# Patient Record
Sex: Female | Born: 1940 | Race: White | Hispanic: No | Smoking: Never smoker
Health system: Southern US, Community
[De-identification: ages and names within clinical notes are randomized; demographics above are authoritative.]

## PROBLEM LIST (undated history)

## (undated) DIAGNOSIS — I82402 Acute embolism and thrombosis of unspecified deep veins of left lower extremity: Secondary | ICD-10-CM

## (undated) DIAGNOSIS — F32A Depression, unspecified: Secondary | ICD-10-CM

## (undated) DIAGNOSIS — I340 Nonrheumatic mitral (valve) insufficiency: Secondary | ICD-10-CM

## (undated) DIAGNOSIS — I10 Essential (primary) hypertension: Secondary | ICD-10-CM

## (undated) DIAGNOSIS — I4891 Unspecified atrial fibrillation: Secondary | ICD-10-CM

## (undated) DIAGNOSIS — C801 Malignant (primary) neoplasm, unspecified: Secondary | ICD-10-CM

## (undated) DIAGNOSIS — F419 Anxiety disorder, unspecified: Secondary | ICD-10-CM

## (undated) DIAGNOSIS — G43909 Migraine, unspecified, not intractable, without status migrainosus: Secondary | ICD-10-CM

## (undated) DIAGNOSIS — I2699 Other pulmonary embolism without acute cor pulmonale: Secondary | ICD-10-CM

## (undated) DIAGNOSIS — I639 Cerebral infarction, unspecified: Secondary | ICD-10-CM

## (undated) DIAGNOSIS — E119 Type 2 diabetes mellitus without complications: Secondary | ICD-10-CM

## (undated) DIAGNOSIS — M1990 Unspecified osteoarthritis, unspecified site: Secondary | ICD-10-CM

## (undated) DIAGNOSIS — F329 Major depressive disorder, single episode, unspecified: Secondary | ICD-10-CM

## (undated) HISTORY — DX: Acute embolism and thrombosis of unspecified deep veins of left lower extremity: I82.402

## (undated) HISTORY — PX: ABDOMINAL HYSTERECTOMY: SHX81

## (undated) HISTORY — DX: Other pulmonary embolism without acute cor pulmonale: I26.99

## (undated) HISTORY — PX: TONSILLECTOMY: SUR1361

## (undated) HISTORY — DX: Nonrheumatic mitral (valve) insufficiency: I34.0

## (undated) HISTORY — PX: HERNIA REPAIR: SHX51

## (undated) HISTORY — PX: BUNIONECTOMY: SHX129

## (undated) HISTORY — PX: CATARACT EXTRACTION: SUR2

---

## 2000-12-03 ENCOUNTER — Encounter: Payer: Self-pay | Admitting: Specialist

## 2000-12-03 ENCOUNTER — Encounter: Admission: RE | Admit: 2000-12-03 | Discharge: 2000-12-03 | Payer: Self-pay | Admitting: Specialist

## 2000-12-31 ENCOUNTER — Ambulatory Visit (HOSPITAL_COMMUNITY): Admission: RE | Admit: 2000-12-31 | Discharge: 2000-12-31 | Payer: Self-pay | Admitting: Specialist

## 2000-12-31 ENCOUNTER — Encounter: Payer: Self-pay | Admitting: Specialist

## 2003-05-02 ENCOUNTER — Ambulatory Visit (HOSPITAL_COMMUNITY): Admission: RE | Admit: 2003-05-02 | Discharge: 2003-05-02 | Payer: Self-pay | Admitting: Internal Medicine

## 2010-06-27 ENCOUNTER — Encounter (HOSPITAL_COMMUNITY): Payer: Medicare Other

## 2010-06-27 ENCOUNTER — Other Ambulatory Visit: Payer: Self-pay | Admitting: Ophthalmology

## 2010-06-27 LAB — HEMOGLOBIN AND HEMATOCRIT, BLOOD
HCT: 41.1 % (ref 36.0–46.0)
Hemoglobin: 13.9 g/dL (ref 12.0–15.0)

## 2010-06-27 LAB — BASIC METABOLIC PANEL
BUN: 11 mg/dL (ref 6–23)
CO2: 27 mEq/L (ref 19–32)
Calcium: 9.1 mg/dL (ref 8.4–10.5)
Chloride: 103 mEq/L (ref 96–112)
Creatinine, Ser: 0.62 mg/dL (ref 0.4–1.2)
GFR calc Af Amer: >60 mL/min (ref >60)
GFR calc non Af Amer: >60 mL/min (ref >60)
Glucose, Bld: 170 mg/dL — ABNORMAL HIGH (ref 70–99)
Potassium: 4.3 mEq/L (ref 3.5–5.1)
Sodium: 141 mEq/L (ref 135–145)

## 2010-07-02 ENCOUNTER — Ambulatory Visit (HOSPITAL_COMMUNITY)
Admission: RE | Admit: 2010-07-02 | Discharge: 2010-07-02 | Disposition: A | Discharge status: Home / Self Care | Payer: Medicare Other | Source: Ambulatory Visit | Attending: Ophthalmology | Admitting: Ophthalmology

## 2010-07-02 DIAGNOSIS — E119 Type 2 diabetes mellitus without complications: Secondary | ICD-10-CM | POA: Insufficient documentation

## 2010-07-02 DIAGNOSIS — Z794 Long term (current) use of insulin: Secondary | ICD-10-CM | POA: Insufficient documentation

## 2010-07-02 DIAGNOSIS — Z01812 Encounter for preprocedural laboratory examination: Secondary | ICD-10-CM | POA: Insufficient documentation

## 2010-07-02 DIAGNOSIS — Z79899 Other long term (current) drug therapy: Secondary | ICD-10-CM | POA: Insufficient documentation

## 2010-07-02 DIAGNOSIS — I10 Essential (primary) hypertension: Secondary | ICD-10-CM | POA: Insufficient documentation

## 2010-07-02 DIAGNOSIS — Z7982 Long term (current) use of aspirin: Secondary | ICD-10-CM | POA: Insufficient documentation

## 2010-07-02 DIAGNOSIS — Z01818 Encounter for other preprocedural examination: Secondary | ICD-10-CM | POA: Insufficient documentation

## 2010-07-02 DIAGNOSIS — H2510 Age-related nuclear cataract, unspecified eye: Secondary | ICD-10-CM | POA: Insufficient documentation

## 2010-07-02 LAB — GLUCOSE, CAPILLARY: Glucose-Capillary: 105 mg/dL — ABNORMAL HIGH (ref 70–99)

## 2010-07-19 ENCOUNTER — Other Ambulatory Visit (HOSPITAL_COMMUNITY): Payer: Medicare Other

## 2010-07-26 ENCOUNTER — Ambulatory Visit (HOSPITAL_COMMUNITY): Admission: RE | Admit: 2010-07-26 | Payer: Medicare Other | Source: Ambulatory Visit | Admitting: Ophthalmology

## 2011-03-30 ENCOUNTER — Encounter: Payer: Self-pay | Admitting: *Deleted

## 2011-03-30 ENCOUNTER — Emergency Department (HOSPITAL_COMMUNITY): Payer: Medicare Other

## 2011-03-30 ENCOUNTER — Emergency Department (HOSPITAL_COMMUNITY)
Admission: EM | Admit: 2011-03-30 | Discharge: 2011-03-30 | Disposition: A | Discharge status: Home / Self Care | Payer: Medicare Other | Attending: Emergency Medicine | Admitting: Emergency Medicine

## 2011-03-30 DIAGNOSIS — I10 Essential (primary) hypertension: Secondary | ICD-10-CM | POA: Insufficient documentation

## 2011-03-30 DIAGNOSIS — S82899A Other fracture of unspecified lower leg, initial encounter for closed fracture: Secondary | ICD-10-CM | POA: Insufficient documentation

## 2011-03-30 DIAGNOSIS — Y92009 Unspecified place in unspecified non-institutional (private) residence as the place of occurrence of the external cause: Secondary | ICD-10-CM | POA: Insufficient documentation

## 2011-03-30 DIAGNOSIS — R296 Repeated falls: Secondary | ICD-10-CM | POA: Insufficient documentation

## 2011-03-30 DIAGNOSIS — M129 Arthropathy, unspecified: Secondary | ICD-10-CM | POA: Insufficient documentation

## 2011-03-30 DIAGNOSIS — E119 Type 2 diabetes mellitus without complications: Secondary | ICD-10-CM | POA: Insufficient documentation

## 2011-03-30 HISTORY — DX: Essential (primary) hypertension: I10

## 2011-03-30 HISTORY — DX: Unspecified osteoarthritis, unspecified site: M19.90

## 2011-03-30 MED ORDER — HYDROCODONE-ACETAMINOPHEN 5-325 MG PO TABS
1.0000 | ORAL_TABLET | Freq: Once | ORAL | Status: AC
  Administered 2011-03-30: 1 via ORAL
  Filled 2011-03-30 (×2): qty 1

## 2011-03-30 MED ORDER — IBUPROFEN 600 MG PO TABS: 600.0000 mg | ORAL_TABLET | Freq: Four times a day (QID) | ORAL | Status: AC | PRN

## 2011-03-30 MED ORDER — HYDROCODONE-ACETAMINOPHEN 5-325 MG PO TABS: 1.0000 | ORAL_TABLET | ORAL | Status: AC | PRN

## 2011-03-30 MED ORDER — IBUPROFEN 800 MG PO TABS
800.0000 mg | ORAL_TABLET | Freq: Once | ORAL | Status: AC
  Administered 2011-03-30: 800 mg via ORAL
  Filled 2011-03-30 (×2): qty 1

## 2011-03-30 NOTE — ED Notes (Signed)
Pt c/o pain and swelling to her left ankle since falling this afternoon. Pt states that she was coming from the mailbox and twisted her ankle. Left ankle swollen. Alert and oriented x 3. Skin warm and dry. Color pink.

## 2011-03-30 NOTE — ED Notes (Signed)
Left leg elevated and ice packs applied.

## 2011-03-30 NOTE — ED Provider Notes (Signed)
History     CSN: [id number] Arrival date & time: 03/30/2011  3:59 PM   First MD Initiated Contact with Patient 03/30/11 1601      Chief Complaint  Patient presents with  . Ankle Pain    (Consider location/radiation/quality/duration/timing/severity/associated sxs/prior treatment) HPI Comments: Sandra [name] is a 70 y.o. female who presents to the Emergency Department complaining of fall with ankle pain just prior to arrival. She was walking around her home when he either twisted her left foot and fell or fell and twisted her foot. Unable to bear weight and had immediate swelling to the lateral l left ankle. She has taken no medicines. She broke the same ankle 6 years ago.   Past Medical History  Diagnosis Date  . Diabetes mellitus   . Hypertension   . Arthritis     Past Surgical History  Procedure Date  . Abdominal hysterectomy   . Hernia repair   . Foot surgery   . Cataract extraction     History reviewed. No pertinent family history.  History  Substance Use Topics  . Smoking status: Never Smoker   . Smokeless tobacco: Not on file  . Alcohol Use: No    OB History    Grav Para Term Preterm Abortions TAB SAB Ect Mult Living                  Review of Systems 10 Systems reviewed and are negative for acute change except as noted in the HPI. Allergies  Review of patient's allergies indicates not on file.  Home Medications  No current outpatient prescriptions on file.  BP 152/67  Pulse 104  Temp(Src) 98.1 F (36.7 C) (Oral)  Resp 18  Ht 5\' 3"  (1.6 m)  Wt 170 lb (77.111 kg)  BMI 30.11 kg/m2  SpO2 100%  Physical Exam  Nursing note and vitals reviewed. Constitutional: She is oriented to person, place, and time. She appears well-developed and well-nourished. No distress.  HENT:  Head: Normocephalic and atraumatic.  Eyes: EOM are normal.  Neck: Normal range of motion. Neck supple.  Cardiovascular: Normal rate, normal heart sounds and intact distal pulses.    Pulmonary/Chest: Effort normal and breath sounds normal.  Abdominal: Soft. Bowel sounds are normal.  Musculoskeletal:       Left ankle with marked soft tissue swelling to lateral aspect. Unable to dorsiflex or plantar flex foot without pain. Sensation to all toes  Slightly less, cap refill brisk. Pulses 2+.  Neurological: She is alert and oriented to person, place, and time. A cranial nerve deficit is present.  Skin: Skin is warm and dry.    ED Course  Procedures (including critical care time)  Labs Reviewed - No data to display Dg Ankle Complete Left  03/30/2011  *RADIOLOGY REPORT*  Clinical Data: Fall.  Ankle injury.  Lateral ankle pain and swelling.  LEFT ANKLE COMPLETE - 3+ VIEW  Comparison: None.  Findings: Marked lateral soft tissue swelling is seen.  A nondisplaced oblique fracture is seen involving the distal fibula. No other fractures are identified.  The ankle mortise is intact, and the talus is centered within the mortise.  Ankle joint effusion is also noted.  IMPRESSION: Nondisplaced oblique fracture of the distal fibula.  Original Report Authenticated By: Danae Orleans, M.D.   New Prescriptions   HYDROCODONE-ACETAMINOPHEN (NORCO) 5-325 MG PER TABLET    Take 1 tablet by mouth every 4 (four) hours as needed for pain.   IBUPROFEN (ADVIL,MOTRIN) 600 MG TABLET  Take 1 tablet (600 mg total) by mouth every 6 (six) hours as needed for pain.    Patient placed in a posterior splint. She brought her own walker anticipating being unable to bear weight and being unable to use crutches.    MDM  Patient s/p fall with resultant fracture of the left ankle. Placed in posterior splint, given analgesics. Follow up with orthopedics.Pt stable in ED with no significant deterioration in condition.The patient appears reasonably screened and/or stabilized for discharge and I doubt any other medical condition or other [facility] requiring further screening, evaluation, or treatment in the ED at this time  prior to discharge.  MDM Reviewed: nursing note and vitals Interpretation: x-ray           Nicoletta Dress. Colon Branch, MD 03/30/11 [phone]

## 2011-03-30 NOTE — ED Notes (Signed)
C/o ankle pain to left, twisted ankle and fell, heard a pop, hx of fx to same ankle

## 2011-04-23 DIAGNOSIS — I82402 Acute embolism and thrombosis of unspecified deep veins of left lower extremity: Secondary | ICD-10-CM

## 2011-04-23 DIAGNOSIS — I2699 Other pulmonary embolism without acute cor pulmonale: Secondary | ICD-10-CM

## 2011-04-23 HISTORY — DX: Other pulmonary embolism without acute cor pulmonale: I26.99

## 2011-04-23 HISTORY — DX: Acute embolism and thrombosis of unspecified deep veins of left lower extremity: I82.402

## 2011-04-25 ENCOUNTER — Encounter (HOSPITAL_COMMUNITY): Payer: Self-pay | Admitting: Emergency Medicine

## 2011-04-25 ENCOUNTER — Other Ambulatory Visit: Payer: Self-pay

## 2011-04-25 ENCOUNTER — Emergency Department (HOSPITAL_COMMUNITY): Payer: Medicare Other

## 2011-04-25 ENCOUNTER — Inpatient Hospital Stay (HOSPITAL_COMMUNITY)
Admission: EM | Admit: 2011-04-25 | Discharge: 2011-04-28 | DRG: 176 | Disposition: A | Discharge status: Home / Self Care | Payer: Medicare Other | Attending: Family Medicine | Admitting: Family Medicine

## 2011-04-25 DIAGNOSIS — Z79899 Other long term (current) drug therapy: Secondary | ICD-10-CM

## 2011-04-25 DIAGNOSIS — Z7901 Long term (current) use of anticoagulants: Secondary | ICD-10-CM

## 2011-04-25 DIAGNOSIS — I2699 Other pulmonary embolism without acute cor pulmonale: Principal | ICD-10-CM | POA: Diagnosis present

## 2011-04-25 DIAGNOSIS — S82899A Other fracture of unspecified lower leg, initial encounter for closed fracture: Secondary | ICD-10-CM | POA: Diagnosis present

## 2011-04-25 DIAGNOSIS — IMO0001 Reserved for inherently not codable concepts without codable children: Secondary | ICD-10-CM | POA: Diagnosis present

## 2011-04-25 DIAGNOSIS — Z6830 Body mass index (BMI) 30.0-30.9, adult: Secondary | ICD-10-CM

## 2011-04-25 DIAGNOSIS — I82402 Acute embolism and thrombosis of unspecified deep veins of left lower extremity: Secondary | ICD-10-CM | POA: Diagnosis present

## 2011-04-25 DIAGNOSIS — I10 Essential (primary) hypertension: Secondary | ICD-10-CM | POA: Diagnosis present

## 2011-04-25 DIAGNOSIS — Z9849 Cataract extraction status, unspecified eye: Secondary | ICD-10-CM

## 2011-04-25 DIAGNOSIS — X58XXXA Exposure to other specified factors, initial encounter: Secondary | ICD-10-CM

## 2011-04-25 DIAGNOSIS — R5381 Other malaise: Secondary | ICD-10-CM | POA: Diagnosis present

## 2011-04-25 DIAGNOSIS — Z794 Long term (current) use of insulin: Secondary | ICD-10-CM

## 2011-04-25 DIAGNOSIS — M129 Arthropathy, unspecified: Secondary | ICD-10-CM | POA: Diagnosis present

## 2011-04-25 DIAGNOSIS — I4949 Other premature depolarization: Secondary | ICD-10-CM | POA: Diagnosis present

## 2011-04-25 DIAGNOSIS — I82409 Acute embolism and thrombosis of unspecified deep veins of unspecified lower extremity: Secondary | ICD-10-CM | POA: Diagnosis present

## 2011-04-25 DIAGNOSIS — S82892A Other fracture of left lower leg, initial encounter for closed fracture: Secondary | ICD-10-CM | POA: Diagnosis present

## 2011-04-25 LAB — POCT I-STAT, CHEM 8
BUN: 19 mg/dL (ref 6–23)
Calcium, Ion: 1.22 mmol/L (ref 1.12–1.32)
Chloride: 107 mEq/L (ref 96–112)
Creatinine, Ser: 0.8 mg/dL (ref 0.50–1.10)
Glucose, Bld: 116 mg/dL — ABNORMAL HIGH (ref 70–99)
HCT: 41 % (ref 36.0–46.0)
Hemoglobin: 13.9 g/dL (ref 12.0–15.0)
Potassium: 3.9 mEq/L (ref 3.5–5.1)
Sodium: 141 mEq/L (ref 135–145)
TCO2: 25 mmol/L (ref 0–100)

## 2011-04-25 LAB — POCT I-STAT TROPONIN I: Troponin i, poc: 0 ng/mL (ref 0.00–0.08)

## 2011-04-25 MED ORDER — ENOXAPARIN SODIUM 80 MG/0.8ML ~~LOC~~ SOLN
80.0000 mg | SUBCUTANEOUS | Status: AC
  Administered 2011-04-26: 80 mg via SUBCUTANEOUS
  Filled 2011-04-25: qty 0.8

## 2011-04-25 MED ORDER — IOHEXOL 300 MG/ML  SOLN
100.0000 mL | Freq: Once | INTRAMUSCULAR | Status: AC | PRN
  Administered 2011-04-25: 100 mL via INTRAVENOUS

## 2011-04-25 NOTE — ED Notes (Signed)
Pt presents with 1 week h/o chest discomfort, shortness of breath and palpitations.  Pt had surgery on L ankle on 12/10, pt seen at Ortho office and pt referred here.

## 2011-04-25 NOTE — ED Notes (Signed)
Patient is back from CT.

## 2011-04-25 NOTE — ED Provider Notes (Signed)
I saw and evaluated the patient, reviewed the resident's note and I agree with the findings and plan.  I personally reviewed Sandra [name] EKG and agree with Dr. Leodis Rains documentation.  70yF with dyspnea and mild CP. Recent L ankle fx a few weeks ago. Symptoms onset about a week ago. Dyspnea. CP and feels like heart racing. Symptoms constant. On exam NAD. HEENT unremarkable. Lungs clear and heart regular w/o murmur. Abdomen benign. L ankle splinted. CT with evidence of b/l PE. Dose lovenox and admit for further tx and eval.   [name], MD 04/28/11 1050

## 2011-04-26 ENCOUNTER — Other Ambulatory Visit: Payer: Self-pay

## 2011-04-26 ENCOUNTER — Encounter (HOSPITAL_COMMUNITY): Payer: Self-pay | Admitting: Internal Medicine

## 2011-04-26 DIAGNOSIS — R609 Edema, unspecified: Secondary | ICD-10-CM

## 2011-04-26 DIAGNOSIS — I2699 Other pulmonary embolism without acute cor pulmonale: Secondary | ICD-10-CM | POA: Diagnosis present

## 2011-04-26 LAB — CBC
Hemoglobin: 13 g/dL (ref 12.0–15.0)
MCH: 28.6 pg (ref 26.0–34.0)
MCV: 86.1 fL (ref 78.0–100.0)
RBC: 4.54 MIL/uL (ref 3.87–5.11)

## 2011-04-26 LAB — COMPREHENSIVE METABOLIC PANEL
AST: 14 U/L (ref 0–37)
Albumin: 3.1 g/dL — ABNORMAL LOW (ref 3.5–5.2)
Calcium: 9.2 mg/dL (ref 8.4–10.5)
Creatinine, Ser: 0.69 mg/dL (ref 0.50–1.10)
GFR calc non Af Amer: 87 mL/min — ABNORMAL LOW (ref >90)

## 2011-04-26 LAB — GLUCOSE, CAPILLARY: Glucose-Capillary: 254 mg/dL — ABNORMAL HIGH (ref 70–99)

## 2011-04-26 LAB — CARDIAC PANEL(CRET KIN+CKTOT+MB+TROPI)
CK, MB: 1.9 ng/mL (ref 0.3–4.0)
Relative Index: INVALID (ref 0.0–2.5)
Relative Index: INVALID (ref 0.0–2.5)
Total CK: 34 U/L (ref 7–177)
Total CK: 38 U/L (ref 7–177)
Troponin I: <0.3 ng/mL (ref <0.30)

## 2011-04-26 LAB — HEMOGLOBIN A1C: Mean Plasma Glucose: 174 mg/dL — ABNORMAL HIGH (ref <117)

## 2011-04-26 LAB — PROTIME-INR: Prothrombin Time: 14.3 seconds (ref 11.6–15.2)

## 2011-04-26 LAB — TSH: TSH: 4.175 u[IU]/mL (ref 0.350–4.500)

## 2011-04-26 MED ORDER — ACETAMINOPHEN 650 MG RE SUPP: 650.0000 mg | Freq: Four times a day (QID) | RECTAL | Status: DC | PRN

## 2011-04-26 MED ORDER — ONDANSETRON HCL 4 MG/2ML IJ SOLN: 4.0000 mg | Freq: Four times a day (QID) | INTRAMUSCULAR | Status: DC | PRN

## 2011-04-26 MED ORDER — OMEGA-3 FATTY ACIDS 1000 MG PO CAPS: 2.0000 g | ORAL_CAPSULE | Freq: Every day | ORAL | Status: DC

## 2011-04-26 MED ORDER — WARFARIN SODIUM 7.5 MG PO TABS
7.5000 mg | ORAL_TABLET | Freq: Once | ORAL | Status: AC
  Administered 2011-04-26: 7.5 mg via ORAL
  Filled 2011-04-26: qty 1

## 2011-04-26 MED ORDER — ONDANSETRON HCL 4 MG PO TABS: 4.0000 mg | ORAL_TABLET | Freq: Four times a day (QID) | ORAL | Status: DC | PRN

## 2011-04-26 MED ORDER — OMEGA-3-ACID ETHYL ESTERS 1 G PO CAPS
1.0000 g | ORAL_CAPSULE | Freq: Every day | ORAL | Status: DC
  Administered 2011-04-26 – 2011-04-28 (×3): 1 g via ORAL
  Filled 2011-04-26 (×3): qty 1

## 2011-04-26 MED ORDER — TRAMADOL HCL 50 MG PO TABS
50.0000 mg | ORAL_TABLET | Freq: Two times a day (BID) | ORAL | Status: DC | PRN
  Filled 2011-04-26: qty 1

## 2011-04-26 MED ORDER — ENOXAPARIN SODIUM 80 MG/0.8ML ~~LOC~~ SOLN
80.0000 mg | Freq: Two times a day (BID) | SUBCUTANEOUS | Status: DC
  Administered 2011-04-26 – 2011-04-27 (×3): 80 mg via SUBCUTANEOUS
  Filled 2011-04-26 (×4): qty 0.8

## 2011-04-26 MED ORDER — PATIENT'S GUIDE TO USING COUMADIN BOOK
Freq: Once | Status: AC
  Administered 2011-04-26: 19:00:00
  Filled 2011-04-26: qty 1

## 2011-04-26 MED ORDER — CALCIUM 1200 1200-1000 MG-UNIT PO CHEW: 1200.0000 mg | CHEWABLE_TABLET | Freq: Two times a day (BID) | ORAL | Status: DC

## 2011-04-26 MED ORDER — INSULIN ASPART 100 UNIT/ML ~~LOC~~ SOLN
0.0000 [IU] | Freq: Three times a day (TID) | SUBCUTANEOUS | Status: DC
Start: 2011-04-26 — End: 2011-04-28
  Administered 2011-04-26: 5 [IU] via SUBCUTANEOUS
  Administered 2011-04-26 (×2): 3 [IU] via SUBCUTANEOUS
  Administered 2011-04-27: 1 [IU] via SUBCUTANEOUS
  Administered 2011-04-27 – 2011-04-28 (×3): 2 [IU] via SUBCUTANEOUS
  Filled 2011-04-26: qty 3

## 2011-04-26 MED ORDER — GLIMEPIRIDE 4 MG PO TABS
4.0000 mg | ORAL_TABLET | Freq: Two times a day (BID) | ORAL | Status: DC
  Administered 2011-04-26 – 2011-04-28 (×5): 4 mg via ORAL
  Filled 2011-04-26 (×7): qty 1

## 2011-04-26 MED ORDER — WARFARIN VIDEO: Freq: Once | Status: DC

## 2011-04-26 MED ORDER — CALCIUM CARBONATE-VITAMIN D 500-200 MG-UNIT PO TABS
1.0000 | ORAL_TABLET | Freq: Two times a day (BID) | ORAL | Status: DC
  Administered 2011-04-26 – 2011-04-28 (×6): 1 via ORAL
  Filled 2011-04-26 (×6): qty 1

## 2011-04-26 MED ORDER — ACETAMINOPHEN 325 MG PO TABS: 650.0000 mg | ORAL_TABLET | Freq: Four times a day (QID) | ORAL | Status: DC | PRN

## 2011-04-26 MED ORDER — AMLODIPINE BESYLATE 5 MG PO TABS
5.0000 mg | ORAL_TABLET | Freq: Every day | ORAL | Status: DC
  Administered 2011-04-26 – 2011-04-28 (×3): 5 mg via ORAL
  Filled 2011-04-26 (×3): qty 1

## 2011-04-26 MED ORDER — SODIUM CHLORIDE 0.9 % IV SOLN
INTRAVENOUS | Status: DC
  Administered 2011-04-26 – 2011-04-27 (×2): via INTRAVENOUS

## 2011-04-26 MED ORDER — FLUOXETINE HCL 20 MG PO CAPS
20.0000 mg | ORAL_CAPSULE | Freq: Every day | ORAL | Status: DC
  Administered 2011-04-26 – 2011-04-28 (×3): 20 mg via ORAL
  Filled 2011-04-26 (×3): qty 1

## 2011-04-26 NOTE — Progress Notes (Signed)
ANTICOAGULATION CONSULT NOTE - Follow Up Consult  Pharmacy Consult for Lovenox and Coumadin Indication: pulmonary embolus - bilateral, provoked.  No Known Allergies  Patient Measurements: Height: 5\' 3"  (160 cm) Weight: 168 lb 6.9 oz (76.4 kg) IBW/kg (Calculated) : 52.4   Vital Signs: Temp: 98.5 F (36.9 C) (01/04 0659) BP: 152/78 mmHg (01/04 1100) Pulse Rate: 101  (01/04 0659)  Labs:  Basename 04/26/11 1040 04/26/11 0335 04/25/11 2214  HGB -- 13.0 13.9  HCT -- 39.1 41.0  PLT -- 179 --  APTT -- -- --  LABPROT -- -- --  INR -- -- --  HEPARINUNFRC -- -- --  CREATININE -- 0.69 0.80  CKTOTAL 35 34 --  CKMB 1.8 2.0 --  TROPONINI <0.30 <0.30 --   Estimated Creatinine Clearance: 65 ml/min (by C-G formula based on Cr of 0.69).  Assessment:   Lovenox 80 mg SQ q12 hrs begun early this AM.   To begin Coumadin tonight. Overlap day # 1 of 5 minimum.    Left ankle fracture last month. Now with PE.  Dopplers pending.  No PT/INR yet.   Off home Aspirin,  Diclofenac and Estrogens.   Holding home Metformin s/p CT 1/3.  Goal of Therapy:    Therapeutic anticoagulation   INR 2-3   Plan:     Continue Lovenox 80 mg SQ q12 hrs.    Will order baseline PT/INR.    Coumadin 7.5 mg tonight.    Coumadin book/video.  Education prior to discharge.     Daily PT/INR to begin in AM. Q3 day CBC to begin in AM.  Dennie Fetters Pager: [phone] 04/26/2011,12:52 PM

## 2011-04-26 NOTE — H&P (Signed)
Sandra [name] is an 71 y.o. female.   PCP - Dr.Vyas Chief Complaint: Shortness of breath. HPI: 71 year-old female with history of diabetes mellitus type 2, hypertension, recent left ankle fracture sustained last month and is being managed conservatively present to the ER because of shortness of breath worsening over the last one week. Patient is short of breath when she exerts last couple of days even at rest. Denies any chest pain palpitations dizziness nausea vomiting without pain or diarrhea. Patient had a CAT scan angiogram of the chest which shows bilateral pulmonary embolism patient will be admitted for further management.  Past Medical History  Diagnosis Date  . Diabetes mellitus   . Hypertension   . Arthritis     Past Surgical History  Procedure Date  . Abdominal hysterectomy   . Hernia repair   . Foot surgery   . Cataract extraction     History reviewed. No pertinent family history. Social History:  reports that she has never smoked. She does not have any smokeless tobacco history on file. She reports that she does not drink alcohol or use illicit drugs.  Allergies: No Known Allergies  Medications Prior to Admission  Medication Dose Route Frequency Provider Last Rate Last Dose  . enoxaparin (LOVENOX) injection 80 mg  80 mg Subcutaneous To Major Mike Schinlever   80 mg at 04/26/11 0009  . iohexol (OMNIPAQUE) 300 MG/ML solution 100 mL  100 mL Intravenous Once PRN Medication Radiologist   100 mL at 04/25/11 2320   No current outpatient prescriptions on file as of 04/25/2011.    Results for orders placed during the hospital encounter of 04/25/11 (from the past 48 hour(s))  POCT I-STAT TROPONIN I     Status: Normal   Collection Time   04/25/11 10:12 PM      Component Value Range Comment   Troponin i, poc 0.00  0.00 - 0.08 (ng/mL)    Comment 3            POCT I-STAT, CHEM 8     Status: Abnormal   Collection Time   04/25/11 10:14 PM      Component Value Range Comment   Sodium  141  135 - 145 (mEq/L)    Potassium 3.9  3.5 - 5.1 (mEq/L)    Chloride 107  96 - 112 (mEq/L)    BUN 19  6 - 23 (mg/dL)    Creatinine, Ser 3.87  0.50 - 1.10 (mg/dL)    Glucose, Bld 564 (*) 70 - 99 (mg/dL)    Calcium, Ion 3.32  1.12 - 1.32 (mmol/L)    TCO2 25  0 - 100 (mmol/L)    Hemoglobin 13.9  12.0 - 15.0 (g/dL)    HCT 95.1  88.4 - 16.6 (%)    Ct Angio Chest W/cm &/or Wo Cm  04/25/2011  *RADIOLOGY REPORT*  Clinical Data:  Shortness of breath.  CT ANGIOGRAPHY CHEST WITH CONTRAST  Technique:  Multidetector CT imaging of the chest was performed using the standard protocol during bolus administration of intravenous contrast.  Multiplanar CT image reconstructions including MIPs were obtained to evaluate the vascular anatomy.  Contrast: OMNIPAQUE IOHEXOL 300 MG/ML IV SOLN  Comparison:  None.  Findings:  There are large bilateral pulmonary emboli, right worse than left.  Emboli involve all lobes of both lungs.  This is most pronounced in the right upper and right lower lobes.  Dependent atelectasis in the lungs.  Otherwise no confluent opacities.  No effusions.  Heart is normal size.  Aorta is normal caliber. No mediastinal, hilar, or axillary adenopathy.  Visualized thyroid and chest wall soft tissues unremarkable. Imaging into the upper abdomen shows no acute findings.  Multiple low-density lesions in the liver, most compatible with cysts.  No acute bony abnormality.  Review of the MIP images confirms the above findings.  IMPRESSION: Large bilateral pulmonary emboli, right greater than left.  Dependent atelectasis in the in the lungs bilaterally.  Critical Value/emergent results were called by telephone at the time of interpretation on 04/25/2011  at 11:25 p.m.  to  Dr. Juleen China, who verbally acknowledged these results.  Original Report Authenticated By: Cyndie Chime, M.D.    Review of Systems  Constitutional: Negative.   HENT: Negative.   Eyes: Negative.   Respiratory: Positive for shortness of  breath.   Cardiovascular: Negative.   Gastrointestinal: Negative.   Genitourinary: Negative.   Musculoskeletal: Negative.   Skin: Negative.   Neurological: Negative.   Endo/Heme/Allergies: Negative.   Psychiatric/Behavioral: Negative.     Blood pressure 143/73, pulse 96, temperature 98.9 F (37.2 C), temperature source Oral, resp. rate 17, SpO2 95.00%. Physical Exam  Constitutional: She is oriented to person, place, and time. She appears well-developed and well-nourished. No distress.  HENT:  Head: Normocephalic and atraumatic.  Right Ear: External ear normal.  Left Ear: External ear normal.  Nose: Nose normal.  Mouth/Throat: Oropharynx is clear and moist. No oropharyngeal exudate.  Eyes: Conjunctivae and EOM are normal. Pupils are equal, round, and reactive to light. Right eye exhibits no discharge. Left eye exhibits no discharge. No scleral icterus.  Neck: Normal range of motion. Neck supple.  Cardiovascular: Normal rate, regular rhythm and normal heart sounds.   Respiratory: Effort normal and breath sounds normal. No respiratory distress. She has no wheezes. She has no rales.  GI: Soft. Bowel sounds are normal. She exhibits no distension. There is no tenderness. There is no rebound.  Musculoskeletal: Normal range of motion. She exhibits no edema and no tenderness.       Left leg in splints.  Neurological: She is alert and oriented to person, place, and time.       Patient is moving upper and lower extremities.  Skin: Skin is warm and dry. She is not diaphoretic.  Psychiatric: Her behavior is normal.     Assessment/Plan #1. Bilateral pulmonary embolism, provoked - patient is at this time hemodynamically stable so we will monitor patient on telemetry. Patient's risk factor for pulmonary embolism is a recent left ankle fracture and patient being more bedbound since and also patient being on estrogen hormone replacement for hot flashes. At this time patient has been given Lovenox.  We will continue Lovenox and if patient's insurance would allow Xaralta then we may discharge patient on xaralta when clinically stable. We will also do Doppler of the lower extremity. #2. History of diabetes mellitus2 and hypertension - continue present medications except for metformin. Patient will be on sliding scale coverage insulin.  CODE STATUS - full code.  Sandra [name] N. 04/26/2011, 1:32 AM

## 2011-04-26 NOTE — Progress Notes (Signed)
Pt with ventricular trigeminy this AM per cardiac monitoring. Pt asymptomatic and asleep in bed. Donnamarie Poag, NP notified. NP instructed to obtain EKG if ventricular trigeminy sustains. EKG obtained and showed sinus tachycardia with frequent PVC's. This was the same rhythm obtained via an ED EKG before the pt was admitted to this floor. Will continue to monitor.

## 2011-04-26 NOTE — Progress Notes (Signed)
*  PRELIMINARY RESULTS* Left: Evidence of deep vein thrombosis.  No Baker's cyst. Right : No evidence of deep vein thrombosis. No Baker's cyst.  Mila Homer 04/26/2011, 2:04 PM

## 2011-04-26 NOTE — Progress Notes (Signed)
Noted dvt to lower left leg per vascular tech. Pt on lovenox and coumadin.

## 2011-04-26 NOTE — Progress Notes (Signed)
Subjective: This lady feels better with her breathing. She has no cough or pleuritic chest pain. She has no hemoptysis. There is no fever. She has been started on full dose Lovenox.           Physical Exam: Blood pressure 152/78, pulse 101, temperature 98.5 F (36.9 C), temperature source Oral, resp. rate 24, height 5\' 3"  (1.6 m), weight 76.4 kg (168 lb 6.9 oz), SpO2 95.00%. She looks systemically well. There is no increased work of breathing. There is no peripheral central cyanosis. Lung fields are entirely clear with no evidence of pleural rubs, crackles, wheezes or bronchial breathing. Heart sounds are present and somewhat irregular, telemetry shows PVCs.   Investigations:     Basic Metabolic Panel:  Basename 04/26/11 0335 04/25/11 2214  NA 137 141  K 3.6 3.9  CL 101 107  CO2 22 --  GLUCOSE 361* 116*  BUN 18 19  CREATININE 0.69 0.80  CALCIUM 9.2 --  MG -- --  PHOS -- --   Liver Function Tests:  [facility] 04/26/11 0335  AST 14  ALT 16  ALKPHOS 81  BILITOT 0.3  PROT 6.0  ALBUMIN 3.1*     CBC:  Basename 04/26/11 0335 04/25/11 2214  WBC 9.2 --  NEUTROABS -- --  HGB 13.0 13.9  HCT 39.1 41.0  MCV 86.1 --  PLT 179 --    Ct Angio Chest W/cm &/or Wo Cm  04/25/2011  *RADIOLOGY REPORT*  Clinical Data:  Shortness of breath.  CT ANGIOGRAPHY CHEST WITH CONTRAST  Technique:  Multidetector CT imaging of the chest was performed using the standard protocol during bolus administration of intravenous contrast.  Multiplanar CT image reconstructions including MIPs were obtained to evaluate the vascular anatomy.  Contrast: OMNIPAQUE IOHEXOL 300 MG/ML IV SOLN  Comparison:  None.  Findings:  There are large bilateral pulmonary emboli, right worse than left.  Emboli involve all lobes of both lungs.  This is most pronounced in the right upper and right lower lobes.  Dependent atelectasis in the lungs.  Otherwise no confluent opacities.  No effusions.  Heart is normal size.   Aorta is normal caliber. No mediastinal, hilar, or axillary adenopathy.  Visualized thyroid and chest wall soft tissues unremarkable. Imaging into the upper abdomen shows no acute findings.  Multiple low-density lesions in the liver, most compatible with cysts.  No acute bony abnormality.  Review of the MIP images confirms the above findings.  IMPRESSION: Large bilateral pulmonary emboli, right greater than left.  Dependent atelectasis in the in the lungs bilaterally.  Critical Value/emergent results were called by telephone at the time of interpretation on 04/25/2011  at 11:25 p.m.  to  Dr. Juleen China, who verbally acknowledged these results.  Original Report Authenticated By: Cyndie Chime, M.D.      Medications: I have reviewed the patient's current medications.  Impression: 1. Bilateral pulmonary emboli, secondary to recent immobilization from left ankle fracture. 2. Type 2 diabetes mellitus. 3. Multiple PVCs, asymptomatic. 4. Recent left ankle fracture, already reviewed by orthopedics as an outpatient.     Plan: 1. Continue with full dose Lovenox and start Coumadin per pharmacy. Try to achieve INR between 2-3. 2. Patient can be discharged home in medically stable, she will likely require Lovenox at home and then transitioning to Coumadin. She'll require Coumadin for 6 months or so.     LOS: 1 day   Wilson Singer Pager [phone]  04/26/2011, 12:45 PM

## 2011-04-26 NOTE — Progress Notes (Signed)
States  takes lantus  64 units at home daily.

## 2011-04-26 NOTE — Progress Notes (Signed)
Utilization review completed. Sandra [name] 04/26/2011 

## 2011-04-26 NOTE — ED Provider Notes (Signed)
History     CSN: [id number]  Arrival date & time 04/25/11  1521   First MD Initiated Contact with Patient 04/25/11 2100      Chief Complaint  Patient presents with  . Palpitations    (Consider location/radiation/quality/duration/timing/severity/associated sxs/prior treatment) HPI history from patient and spouse  Patient is a 71 year old female with history of hypertension diabetes who presents with palpitations and dyspnea. 3 weeks ago, she had left ankle fracture when she rolled her ankle. It has been in a walking boot which she has been immobile since then. No surgery done. Starting 1 week ago, patient had abrupt onset dyspnea and decreased exercise tolerance. She had associated palpitations. No chest pain. Patient denies lower extremity pain except for left ankle. Her exercise tolerance now is just being a walk across the room. No syncope. Aside from worse with exertion there are no other modifying factors noted. Overall severity moderate to severe.  Past Medical History  Diagnosis Date  . Diabetes mellitus   . Hypertension   . Arthritis     Past Surgical History  Procedure Date  . Abdominal hysterectomy   . Hernia repair   . Foot surgery   . Cataract extraction     History reviewed. No pertinent family history.  History  Substance Use Topics  . Smoking status: Never Smoker   . Smokeless tobacco: Not on file  . Alcohol Use: No    OB History    Grav Para Term Preterm Abortions TAB SAB Ect Mult Living                  Review of Systems  Constitutional: Negative for fever and chills.  HENT: Negative for facial swelling.   Eyes: Negative for visual disturbance.  Respiratory: Positive for shortness of breath. Negative for cough, chest tightness and wheezing.   Cardiovascular: Negative for chest pain.  Gastrointestinal: Negative for nausea, vomiting, abdominal pain and diarrhea.  Genitourinary: Negative for difficulty urinating.  Skin: Negative for rash.    Neurological: Negative for weakness and numbness.  Psychiatric/Behavioral: Negative for behavioral problems and confusion.  All other systems reviewed and are negative.    Allergies  Review of patient's allergies indicates no known allergies.  Home Medications   Current Outpatient Rx  Name Route Sig Dispense Refill  . AMLODIPINE BESYLATE 5 MG PO TABS Oral Take 5 mg by mouth daily.      . ASPIRIN 325 MG PO TABS Oral Take 325 mg by mouth daily.      Marland Kitchen CALCIUM 1200 PO Oral Take 1 capsule by mouth 2 (two) times daily.      Marland Kitchen DICLOFENAC SODIUM 75 MG PO TBEC Oral Take 75 mg by mouth 2 (two) times daily as needed. For pain     . ESTROGENS CONJUGATED 0.9 MG PO TABS Oral Take 0.9 mg by mouth daily. Take daily for 21 days then do not take for 7 days.     . OMEGA-3 FATTY ACIDS 1000 MG PO CAPS Oral Take 2 g by mouth daily.      Marland Kitchen FLUOXETINE HCL 20 MG PO CAPS Oral Take 20 mg by mouth daily.      Marland Kitchen GLIMEPIRIDE 4 MG PO TABS Oral Take 4 mg by mouth 2 (two) times daily.      Marland Kitchen METFORMIN HCL 500 MG PO TABS Oral Take 500 mg by mouth 2 (two) times daily with a meal.      . TRAMADOL HCL 50 MG PO TABS Oral Take  50 mg by mouth 2 (two) times daily as needed. For pain       BP 138/57  Pulse 94  Temp(Src) 98.9 F (37.2 C) (Oral)  Resp 19  SpO2 95%  Physical Exam  Nursing note and vitals reviewed. Constitutional: She is oriented to person, place, and time. She appears well-developed and well-nourished. No distress.  HENT:  Head: Normocephalic.  Nose: Nose normal.  Eyes: EOM are normal.  Neck: Normal range of motion. Neck supple.  Cardiovascular: Normal rate, regular rhythm and intact distal pulses.   No murmur heard. Pulmonary/Chest: Effort normal and breath sounds normal. No respiratory distress. She has no wheezes. She exhibits no tenderness.  Abdominal: Soft. She exhibits no distension. There is no tenderness.  Musculoskeletal: Normal range of motion. She exhibits no edema and no tenderness.        No calf TTP  Cam Walker on left lower leg. No underlying calf tenderness or significant swelling.  Neurological: She is alert and oriented to person, place, and time.       Normal strength  Skin: Skin is warm and dry. No rash noted. She is not diaphoretic.  Psychiatric: She has a normal mood and affect. Her behavior is normal. Thought content normal.    ED Course  Procedures (including critical care time)  ECG from 04/25/2011 at 1544: Heart rate 107. Sinus tachycardia with frequent PVCs. Normal axis. No conduction delay. No hypertrophy. Nonspecific ST and T wave changes. When compared to ECG from 06/27/2010, now frequent PVCs with nonspecific T wave changes.  Labs Reviewed  POCT I-STAT, CHEM 8 - Abnormal; Notable for the following:    Glucose, Bld 116 (*)    All other components within normal limits  POCT I-STAT TROPONIN I  I-STAT, CHEM 8  I-STAT TROPONIN I   Ct Angio Chest W/cm &/or Wo Cm  04/25/2011  *RADIOLOGY REPORT*  Clinical Data:  Shortness of breath.  CT ANGIOGRAPHY CHEST WITH CONTRAST  Technique:  Multidetector CT imaging of the chest was performed using the standard protocol during bolus administration of intravenous contrast.  Multiplanar CT image reconstructions including MIPs were obtained to evaluate the vascular anatomy.  Contrast: OMNIPAQUE IOHEXOL 300 MG/ML IV SOLN  Comparison:  None.  Findings:  There are large bilateral pulmonary emboli, right worse than left.  Emboli involve all lobes of both lungs.  This is most pronounced in the right upper and right lower lobes.  Dependent atelectasis in the lungs.  Otherwise no confluent opacities.  No effusions.  Heart is normal size.  Aorta is normal caliber. No mediastinal, hilar, or axillary adenopathy.  Visualized thyroid and chest wall soft tissues unremarkable. Imaging into the upper abdomen shows no acute findings.  Multiple low-density lesions in the liver, most compatible with cysts.  No acute bony abnormality.   Review of the MIP images confirms the above findings.  IMPRESSION: Large bilateral pulmonary emboli, right greater than left.  Dependent atelectasis in the in the lungs bilaterally.  Critical Value/emergent results were called by telephone at the time of interpretation on 04/25/2011  at 11:25 p.m.  to  Dr. Juleen China, who verbally acknowledged these results.  Original Report Authenticated By: Cyndie Chime, M.D.     1. Pulmonary embolism       MDM   Patient with progressively worse dyspnea over the past week in setting of recent ankle fracture and immobilization. High clinical suspicion for PE on initial evaluation. CT scan confirms multilobe R. PE with large clot  per day. Patient is hemodynamically stable with room air saturations in the upper 90s. She clinically is well appearing. Her troponin is 0. Lovenox given and patient admitted to the step down unit.        Kathlene November Jacky Hartung 04/26/11 0200

## 2011-04-26 NOTE — Progress Notes (Addendum)
ANTICOAGULATION CONSULT NOTE - Initial Consult  Pharmacy Consult for lovenox  Indication: pulmonary embolus  No Known Allergies  Patient Measurements: Height: 5\' 3"  (160 cm) Weight: 168 lb 6.9 oz (76.4 kg) IBW/kg (Calculated) : 52.4  Adjusted Body Weight:   Vital Signs: Temp: 98.7 F (37.1 C) (01/04 0256) BP: 143/69 mmHg (01/04 0256) Pulse Rate: 102  (01/04 0256)  Labs:  Basename 04/25/11 2214  HGB 13.9  HCT 41.0  PLT --  APTT --  LABPROT --  INR --  HEPARINUNFRC --  CREATININE 0.80  CKTOTAL --  CKMB --  TROPONINI --   Estimated Creatinine Clearance: 65 ml/min (by C-G formula based on Cr of 0.8).  Medical History: Past Medical History  Diagnosis Date  . Diabetes mellitus   . Hypertension   . Arthritis     Medications:  Prescriptions prior to admission  Medication Sig Dispense Refill  . amLODipine (NORVASC) 5 MG tablet Take 5 mg by mouth daily.        Marland Kitchen aspirin 325 MG tablet Take 325 mg by mouth daily.        . Calcium Carbonate-Vit D-Min (CALCIUM 1200 PO) Take 1 capsule by mouth 2 (two) times daily.        . diclofenac (VOLTAREN) 75 MG EC tablet Take 75 mg by mouth 2 (two) times daily as needed. For pain       . estrogens, conjugated, (PREMARIN) 0.9 MG tablet Take 0.9 mg by mouth daily. Take daily for 21 days then do not take for 7 days.       . fish oil-omega-3 fatty acids 1000 MG capsule Take 2 g by mouth daily.        Marland Kitchen FLUoxetine (PROZAC) 20 MG capsule Take 20 mg by mouth daily.        Marland Kitchen glimepiride (AMARYL) 4 MG tablet Take 4 mg by mouth 2 (two) times daily.        . metFORMIN (GLUCOPHAGE) 500 MG tablet Take 500 mg by mouth 2 (two) times daily with a meal.        . traMADol (ULTRAM) 50 MG tablet Take 50 mg by mouth 2 (two) times daily as needed. For pain         Assessment: 71 yo female with recent ankle fx sustained in December '12 with worsening sob over the last week. CT-angiogram reveals bilateral pulmonary emboli.  Goal of Therapy:  Full dose  lovenox     Plan:  Lovenox 80mg  q12hours next dose 10am (1st dose ED 0009)  Cbc q72 hours while on lovenox   Janice Coffin 04/26/2011,3:58 AM

## 2011-04-27 DIAGNOSIS — I82402 Acute embolism and thrombosis of unspecified deep veins of left lower extremity: Secondary | ICD-10-CM | POA: Diagnosis present

## 2011-04-27 DIAGNOSIS — S82892A Other fracture of left lower leg, initial encounter for closed fracture: Secondary | ICD-10-CM | POA: Diagnosis present

## 2011-04-27 LAB — GLUCOSE, CAPILLARY
Glucose-Capillary: 140 mg/dL — ABNORMAL HIGH (ref 70–99)
Glucose-Capillary: 269 mg/dL — ABNORMAL HIGH (ref 70–99)
Glucose-Capillary: 246 mg/dL — ABNORMAL HIGH (ref 70–99)

## 2011-04-27 LAB — BASIC METABOLIC PANEL
BUN: 9 mg/dL (ref 6–23)
Chloride: 108 mEq/L (ref 96–112)
Creatinine, Ser: 0.53 mg/dL (ref 0.50–1.10)
GFR calc Af Amer: >90 mL/min (ref >90)
Glucose, Bld: 141 mg/dL — ABNORMAL HIGH (ref 70–99)

## 2011-04-27 LAB — CBC
HCT: 37 % (ref 36.0–46.0)
Hemoglobin: 12.3 g/dL (ref 12.0–15.0)
MCH: 28.5 pg (ref 26.0–34.0)
MCV: 85.6 fL (ref 78.0–100.0)
Platelets: 140 10*3/uL — ABNORMAL LOW (ref 150–400)
RBC: 4.32 MIL/uL (ref 3.87–5.11)

## 2011-04-27 MED ORDER — RIVAROXABAN 15 MG PO TABS
15.0000 mg | ORAL_TABLET | Freq: Two times a day (BID) | ORAL | Status: DC
  Filled 2011-04-27 (×2): qty 1

## 2011-04-27 MED ORDER — INSULIN ASPART PROT & ASPART (70-30 MIX) 100 UNIT/ML ~~LOC~~ SUSP
10.0000 [IU] | Freq: Two times a day (BID) | SUBCUTANEOUS | Status: DC
  Administered 2011-04-27 – 2011-04-28 (×2): 10 [IU] via SUBCUTANEOUS
  Filled 2011-04-27: qty 3

## 2011-04-27 MED ORDER — RIVAROXABAN 10 MG PO TABS: 20.0000 mg | ORAL_TABLET | Freq: Every day | ORAL | Status: DC

## 2011-04-27 MED ORDER — RIVAROXABAN 15 MG PO TABS
15.0000 mg | ORAL_TABLET | Freq: Two times a day (BID) | ORAL | Status: DC
  Administered 2011-04-27 – 2011-04-28 (×2): 15 mg via ORAL
  Filled 2011-04-27 (×4): qty 1

## 2011-04-27 MED ORDER — WARFARIN SODIUM 5 MG PO TABS
5.0000 mg | ORAL_TABLET | Freq: Once | ORAL | Status: DC
  Filled 2011-04-27: qty 1

## 2011-04-27 NOTE — Progress Notes (Signed)
ANTICOAGULATION CONSULT NOTE - Follow Up Consult  Pharmacy Consult for Lovenox and Coumadin Indication: pulmonary embolus - bilateral, provoked.  Assessment: 70yoF being treated with full-dose Lovenox and bridged to Coumadin for bilateral pulmonary emboli secondary to recent immobilization from left ankle fracture. Overlap (Coumadin-Lovenox) Day #2 of 5 minimum. Preliminary note from sonographer reported no evidence of deep vein thrombosis and no baker's cyst.  INR sub-theraputic which is expected after only one dose. Patient received coumadin education 04/26/11. Hgb and Plts slightly decreased from yesterday, but no overt bleeding reported in MD/RN notes. Will continue to monitor CBC.  Goal of Therapy:  Therapeutic anticoagulation INR 2-3   Plan:  1) Continue Lovenox 80 mg SQ q12 hrs. 2) Coumadin 5 mg tonight at 1800 3) Daily PT/INR and CBC q72 hours  Sabra Heck Pager: [phone] 04/27/2011,10:13 AM  ----------  No Known Allergies  Patient Measurements: Height: 5\' 3"  (160 cm) Weight: 166 lb 3.6 oz (75.4 kg) (b scale) IBW/kg (Calculated) : 52.4   Vital Signs: Temp: 97.9 F (36.6 C) (01/05 0536) BP: 131/57 mmHg (01/05 0536) Pulse Rate: 77  (01/05 0536)  Labs:  Basename 04/27/11 0700 04/26/11 1825 04/26/11 1602 04/26/11 1040 04/26/11 0335 04/25/11 2214  HGB 12.3 -- -- -- 13.0 --  HCT 37.0 -- -- -- 39.1 41.0  PLT 140* -- -- -- 179 --  APTT -- -- -- -- -- --  LABPROT 14.1 -- 14.3 -- -- --  INR 1.07 -- 1.09 -- -- --  HEPARINUNFRC -- -- -- -- -- --  CREATININE 0.53 -- -- -- 0.69 0.80  CKTOTAL -- 38 -- 35 34 --  CKMB -- 1.9 -- 1.8 2.0 --  TROPONINI -- <0.30 -- <0.30 <0.30 --   Estimated Creatinine Clearance: 64.5 ml/min (by C-G formula based on Cr of 0.53).

## 2011-04-27 NOTE — Progress Notes (Signed)
Subjective: Patient notes that she is short of breath with exertion.  Denies any chest pain.  Objective: Vital signs in last 24 hours: Filed Vitals:   04/26/11 2143 04/27/11 0536 04/27/11 1343 04/27/11 1449  BP: 144/61 131/57 166/74 136/69  Pulse: 93 77 87 86  Temp: 97.9 F (36.6 C) 97.9 F (36.6 C) 98 F (36.7 C) 98.2 F (36.8 C)  TempSrc:   Oral Oral  Resp: 20 18 18 19   Height:      Weight:  75.4 kg (166 lb 3.6 oz)    SpO2: 96% 98% 97% 98%   Weight change: -1 kg (-2 lb 3.3 oz)  Intake/Output Summary (Last 24 hours) at 04/27/11 1632 Last data filed at 04/27/11 1359  Gross per 24 hour  Intake   2375 ml  Output   1551 ml  Net    824 ml    Physical Exam: General: Awake, Oriented, No acute distress. HEENT: EOMI. Neck: Supple CV: S1 and S2 Lungs: Clear to ascultation bilaterally Abdomen: Soft, Nontender, Nondistended, +bowel sounds. Ext: Good pulses. Trace edema.  Lab Results:  Basename 04/27/11 0700 04/26/11 0335  NA 141 137  K 3.8 3.6  CL 108 101  CO2 25 22  GLUCOSE 141* 361*  BUN 9 18  CREATININE 0.53 0.69  CALCIUM 9.1 9.2  MG -- --  PHOS -- --    Basename 04/26/11 0335  AST 14  ALT 16  ALKPHOS 81  BILITOT 0.3  PROT 6.0  ALBUMIN 3.1*   No results found for this basename: LIPASE:2,AMYLASE:2 in the last 72 hours  Basename 04/27/11 0700 04/26/11 0335  WBC 4.7 9.2  NEUTROABS -- --  HGB 12.3 13.0  HCT 37.0 39.1  MCV 85.6 86.1  PLT 140* 179    Basename 04/26/11 1825 04/26/11 1040 04/26/11 0335  CKTOTAL 38 35 34  CKMB 1.9 1.8 2.0  CKMBINDEX -- -- --  TROPONINI <0.30 <0.30 <0.30   No components found with this basename: POCBNP:3 No results found for this basename: DDIMER:2 in the last 72 hours  Basename 04/26/11 0335  HGBA1C 7.7*   No results found for this basename: CHOL:2,HDL:2,LDLCALC:2,TRIG:2,CHOLHDL:2,LDLDIRECT:2 in the last 72 hours  Basename 04/26/11 0335  TSH 4.175  T4TOTAL --  T3FREE --  THYROIDAB --   No results found for  this basename: VITAMINB12:2,FOLATE:2,FERRITIN:2,TIBC:2,IRON:2,RETICCTPCT:2 in the last 72 hours  Micro Results: No results found for this or any previous visit (from the past 240 hour(s)).  Studies/Results: Ct Angio Chest W/cm &/or Wo Cm  04/25/2011  *RADIOLOGY REPORT*  Clinical Data:  Shortness of breath.  CT ANGIOGRAPHY CHEST WITH CONTRAST  Technique:  Multidetector CT imaging of the chest was performed using the standard protocol during bolus administration of intravenous contrast.  Multiplanar CT image reconstructions including MIPs were obtained to evaluate the vascular anatomy.  Contrast: OMNIPAQUE IOHEXOL 300 MG/ML IV SOLN  Comparison:  None.  Findings:  There are large bilateral pulmonary emboli, right worse than left.  Emboli involve all lobes of both lungs.  This is most pronounced in the right upper and right lower lobes.  Dependent atelectasis in the lungs.  Otherwise no confluent opacities.  No effusions.  Heart is normal size.  Aorta is normal caliber. No mediastinal, hilar, or axillary adenopathy.  Visualized thyroid and chest wall soft tissues unremarkable. Imaging into the upper abdomen shows no acute findings.  Multiple low-density lesions in the liver, most compatible with cysts.  No acute bony abnormality.  Review of the MIP  images confirms the above findings.  IMPRESSION: Large bilateral pulmonary emboli, right greater than left.  Dependent atelectasis in the in the lungs bilaterally.  Critical Value/emergent results were called by telephone at the time of interpretation on 04/25/2011  at 11:25 p.m.  to  Dr. Juleen China, who verbally acknowledged these results.  Original Report Authenticated By: Cyndie Chime, M.D.    Medications: I have reviewed the patient's current medications. Scheduled Meds:   . amLODipine  5 mg Oral Daily  . calcium-vitamin D  1 tablet Oral BID  . FLUoxetine  20 mg Oral Daily  . glimepiride  4 mg Oral BID WC  . insulin aspart  0-9 Units Subcutaneous TID WC   . omega-3 acid ethyl esters  1 g Oral Daily  . patient's guide to using coumadin book   Does not apply Once  . rivaroxaban  15 mg Oral BID WC   Followed by  . rivaroxaban  20 mg Oral Daily  . warfarin  7.5 mg Oral ONCE-1800  . DISCONTD: enoxaparin (LOVENOX) injection  80 mg Subcutaneous Q12H  . DISCONTD: rivaroxaban  20 mg Oral Daily  . DISCONTD: rivaroxaban  15 mg Oral BID  . DISCONTD: warfarin  5 mg Oral ONCE-1800  . DISCONTD: warfarin   Does not apply Once   Continuous Infusions:   . DISCONTD: sodium chloride 75 mL/hr at 04/27/11 0643   PRN Meds:.acetaminophen, acetaminophen, ondansetron (ZOFRAN) IV, ondansetron, traMADol  Assessment/Plan: 1. Pulmonary embolism.  Likely due to DVT in the left collection may be from left ankle fracture from immobilization.  Discontinue Lovenox and Coumadin.  Start the patient on Rivaroxaban 15 mg twice daily with meals and then 20 mg daily thereafter.  Talked to the case manager to help determine patient's co-pay given the patient is on Medicare.  Will do a walking desaturation in the morning to determine if the patient qualifies for oxygen.  2.  DM (diabetes mellitus), type 2, uncontrolled with complications.  Not well controlled.  Uncertain if the patient has Lantus at home, requested pharmacy to confirm a Lantus use at home.  3.  Left leg DVT.  Anticoagulation as indicated above.  4.  Ankle fracture, left.  From trauma.  Management as per orthopedics as outpatient.  5.  Generalized weakness and deconditioning.  Will have PT evaluate the patient.  Left leg DVT  6.  Prophylaxis. Rivaroxaban.  7.  Disposition.  Pending.  Consider discharging the patient likely tomorrow if physical therapy has evaluated the patient, walking desaturation done tomorrow, and depending on the co-pay of Rivaroxaban.   LOS: 2 days  Tyrus Wilms A, MD 04/27/2011, 4:32 PM

## 2011-04-27 NOTE — Progress Notes (Signed)
ANTICOAGULATION CONSULT NOTE - Initial Consult  Pharmacy Consult for Xarelto  Indication: Pulmonary Embolism  70yoF with bilateral pulmonary emboli secondary to recent immobilization from left ankle fracture. Preliminary note from sonographer reported no evidence of deep vein thrombosis and no baker's cyst Patient was on Lovenox and Coumadin, but MD has switched to Xarelto for treatment of PE.  Per the Xarelto Package insert for Dosing for treatment of DVT, PE and reduction in the risk of recurrence of DVT and of PE:  Rivaroxaban 15mg  PO twice daily with food for the first 21 days for the initial treatment of acute DVT or PE. After initial treatment period, 20mg  PO once daily with food for the remaining treatment and the long term reduction in the risk of recurrence of DVT and of PE.  Plan:  1) Xarelto dose ordered by MD is appropriate   Benjaman Pott, PharmD     Pager [phone] 04/27/2011   3:25 PM   -----------------------  No Known Allergies  Patient Measurements: Height: 5\' 3"  (160 cm) Weight: 166 lb 3.6 oz (75.4 kg) (b scale) IBW/kg (Calculated) : 52.4   Vital Signs: Temp: 98.2 F (36.8 C) (01/05 1449) Temp src: Oral (01/05 1449) BP: 136/69 mmHg (01/05 1449) Pulse Rate: 86  (01/05 1449)  Labs:  Basename 04/27/11 0700 04/26/11 1825 04/26/11 1602 04/26/11 1040 04/26/11 0335 04/25/11 2214  HGB 12.3 -- -- -- 13.0 --  HCT 37.0 -- -- -- 39.1 41.0  PLT 140* -- -- -- 179 --  APTT -- -- -- -- -- --  LABPROT 14.1 -- 14.3 -- -- --  INR 1.07 -- 1.09 -- -- --  HEPARINUNFRC -- -- -- -- -- --  CREATININE 0.53 -- -- -- 0.69 0.80  CKTOTAL -- 38 -- 35 34 --  CKMB -- 1.9 -- 1.8 2.0 --  TROPONINI -- <0.30 -- <0.30 <0.30 --   Estimated Creatinine Clearance: 64.5 ml/min (by C-G formula based on Cr of 0.53).  Medical History: Past Medical History  Diagnosis Date  . Diabetes mellitus   . Hypertension   . Arthritis

## 2011-04-28 LAB — GLUCOSE, CAPILLARY
Glucose-Capillary: 153 mg/dL — ABNORMAL HIGH (ref 70–99)
Glucose-Capillary: 247 mg/dL — ABNORMAL HIGH (ref 70–99)

## 2011-04-28 LAB — BASIC METABOLIC PANEL
Calcium: 9 mg/dL (ref 8.4–10.5)
GFR calc Af Amer: >90 mL/min (ref >90)
GFR calc non Af Amer: >90 mL/min (ref >90)
Potassium: 3.6 mEq/L (ref 3.5–5.1)
Sodium: 140 mEq/L (ref 135–145)

## 2011-04-28 LAB — CBC
MCH: 28.7 pg (ref 26.0–34.0)
MCHC: 33.8 g/dL (ref 30.0–36.0)
RDW: 13 % (ref 11.5–15.5)

## 2011-04-28 MED ORDER — INSULIN ASPART PROT & ASPART (70-30 MIX) 100 UNIT/ML ~~LOC~~ SUSP: 10.0000 [IU] | Freq: Two times a day (BID) | SUBCUTANEOUS | Status: DC

## 2011-04-28 MED ORDER — UNABLE TO FIND: Status: DC

## 2011-04-28 MED ORDER — GI COCKTAIL ~~LOC~~
30.0000 mL | Freq: Three times a day (TID) | ORAL | Status: DC | PRN
  Administered 2011-04-28: 30 mL via ORAL
  Filled 2011-04-28: qty 30

## 2011-04-28 NOTE — Progress Notes (Signed)
CARE MANAGEMENT NOTE 04/28/2011  Patient:  Sandra [name], Sandra [name]   Account Number:  [id number]  Date Initiated:  04/28/2011  Documentation initiated by:  [facility]  Subjective/Objective Assessment:   DM, HTN, shortness of breath, left ankle fracture     Action/Plan:   Anticipated DC Date:  04/28/2011   Anticipated DC Plan:  HOME/SELF CARE      DC Planning Services  CM consult  Medication Assistance      Choice offered to / List presented to:     DME arranged  Levan Hurst      DME agency  [facility]        Status of service:  Completed, signed off Medicare Important Message given?   (If response is "NO", the following Medicare IM given date fields will be blank) Date Medicare IM given:   Date Additional Medicare IM given:    Discharge Disposition:  HOME/SELF CARE  Per UR Regulation:    Comments:  04/28/2011 1200 Spoke to pt and states she uses [facility] -# [phone]. Contact pharmacy and they are not open. Pt states using [facility] or [facility] in [place]. Contacted [facility] for RW for scheduled d/c today. Contacted [facility] on [address] and they do have Xarelto. Made pt aware. States she has a supplement that helps with copay that is not covered by her Medicare. Isidoro Donning RN CCM Case Mgmt phone [phone]

## 2011-04-28 NOTE — ED Provider Notes (Signed)
I saw and evaluated the patient, reviewed the resident's note and I agree with the findings and plan.  [name], MD 04/28/11 1050

## 2011-04-28 NOTE — Progress Notes (Signed)
Physical Therapy Evaluation Patient Details Name: Sandra [name] MRN: [id number] DOB: 1940-10-31 Today's Date: 04/28/2011  Problem List:  Patient Active Problem List  Diagnoses  . Pulmonary embolism  . DM (diabetes mellitus), type 2, uncontrolled with complications  . Ankle fracture, left  . Left leg DVT    Past Medical History:  Past Medical History  Diagnosis Date  . Diabetes mellitus   . Hypertension   . Arthritis    Past Surgical History:  Past Surgical History  Procedure Date  . Abdominal hysterectomy   . Hernia repair   . Foot surgery   . Cataract extraction     PT Assessment/Plan/Recommendation PT Assessment Clinical Impression Statement: Pt presents with mild deconditioning due to illness/hospitalization.  No f/u PT services indicated.  Pt is independent with all mobility skills using a RW.  Pt currently has a SW, so recommending a RW for home use. PT Recommendation/Assessment: Patent does not need any further PT services No Skilled PT: All education completed;Patient is modified independent with all activity/mobility;Patient will have necessary level of assist by caregiver at discharge PT Recommendation Follow Up Recommendations: None Equipment Recommended: Rolling walker with 5" wheels PT Goals     PT Evaluation Precautions/Restrictions  Precautions Required Braces or Orthoses: Yes Other Brace/Splint: cam boot LLE Prior Functioning  Home Living Lives With: Alone Receives Help From: Family;Friend(s) Type of Home: House Home Layout: One level Home Access: Stairs to enter Entrance Stairs-Rails: None Entrance Stairs-Number of Steps: 2 Home Adaptive Equipment: Walker - standard Prior Function Level of Independence: Independent with basic ADLs;Independent with homemaking with ambulation;Independent with gait;Independent with transfers Vocation: Retired Financial risk analyst Arousal/Alertness: Awake/alert Overall Cognitive Status: Appears within  functional limits for tasks assessed Sensation/Coordination   Extremity Assessment   Mobility (including Balance) Bed Mobility Bed Mobility: Yes Supine to Sit: 7: Independent Transfers Transfers: Yes Sit to Stand: 6: Modified independent (Device/Increase time) Stand to Sit: 6: Modified independent (Device/Increase time) Stand Pivot Transfers: 6: Modified independent (Device/Increase time) Ambulation/Gait Ambulation/Gait: Yes Ambulation/Gait Assistance: 6: Modified independent (Device/Increase time) Ambulation Distance (Feet): 150 Feet (x 2) Assistive device: Rolling walker Gait Pattern: Step-to pattern    Exercise    End of Session PT - End of Session Equipment Utilized During Treatment: Gait belt Activity Tolerance: Patient tolerated treatment well Patient left: in chair;with call bell in reach;with family/visitor present Nurse Communication: Mobility status for transfers;Mobility status for ambulation General Behavior During Session: [facility] for tasks performed Cognition: [facility] for tasks performed  Ilda Foil 04/28/2011, 12:45 PM  Aida Raider, PT  Office # [phone] Pager [phone]

## 2011-04-28 NOTE — Progress Notes (Signed)
Subjective: Patient ambulated with PT, felt winded. Complained of heart burn pain after eating bacon for breakfast this morning.  Objective: Vital signs in last 24 hours: Filed Vitals:   04/27/11 1343 04/27/11 1449 04/27/11 2216 04/28/11 0600  BP: 166/74 136/69 153/62 129/56  Pulse: 87 86 80 74  Temp: 98 F (36.7 C) 98.2 F (36.8 C) 98.2 F (36.8 C) 97.9 F (36.6 C)  TempSrc: Oral Oral Oral Oral  Resp: 18 19 20 18   Height:      Weight:    77.837 kg (171 lb 9.6 oz)  SpO2: 97% 98% 98% 96%   Weight change: 2.437 kg (5 lb 6 oz)  Intake/Output Summary (Last 24 hours) at 04/28/11 1153 Last data filed at 04/28/11 0700  Gross per 24 hour  Intake    840 ml  Output   2050 ml  Net  -1210 ml    Physical Exam: General: Awake, Oriented, No acute distress. HEENT: EOMI. Neck: Supple CV: S1 and S2 Lungs: Clear to ascultation bilaterally Abdomen: Soft, Nontender, Nondistended, +bowel sounds. Ext: Good pulses. Trace edema.  Lab Results:  Basename 04/28/11 0550 04/27/11 0700  NA 140 141  K 3.6 3.8  CL 105 108  CO2 23 25  GLUCOSE 137* 141*  BUN 10 9  CREATININE 0.49* 0.53  CALCIUM 9.0 9.1  MG -- --  PHOS -- --    Basename 04/26/11 0335  AST 14  ALT 16  ALKPHOS 81  BILITOT 0.3  PROT 6.0  ALBUMIN 3.1*   No results found for this basename: LIPASE:2,AMYLASE:2 in the last 72 hours  Basename 04/28/11 0550 04/27/11 0700  WBC 5.1 4.7  NEUTROABS -- --  HGB 12.5 12.3  HCT 37.0 37.0  MCV 84.9 85.6  PLT 143* 140*    Basename 04/26/11 1825 04/26/11 1040 04/26/11 0335  CKTOTAL 38 35 34  CKMB 1.9 1.8 2.0  CKMBINDEX -- -- --  TROPONINI <0.30 <0.30 <0.30   No components found with this basename: POCBNP:3 No results found for this basename: DDIMER:2 in the last 72 hours  Basename 04/26/11 0335  HGBA1C 7.7*   No results found for this basename: CHOL:2,HDL:2,LDLCALC:2,TRIG:2,CHOLHDL:2,LDLDIRECT:2 in the last 72 hours  Basename 04/26/11 0335  TSH 4.175  T4TOTAL --    T3FREE --  THYROIDAB --   No results found for this basename: VITAMINB12:2,FOLATE:2,FERRITIN:2,TIBC:2,IRON:2,RETICCTPCT:2 in the last 72 hours  Micro Results: No results found for this or any previous visit (from the past 240 hour(s)).  Studies/Results: No results found.  Medications: I have reviewed the patient's current medications. Scheduled Meds:    . amLODipine  5 mg Oral Daily  . calcium-vitamin D  1 tablet Oral BID  . FLUoxetine  20 mg Oral Daily  . glimepiride  4 mg Oral BID WC  . insulin aspart  0-9 Units Subcutaneous TID WC  . insulin aspart protamine-insulin aspart  10 Units Subcutaneous BID WC  . omega-3 acid ethyl esters  1 g Oral Daily  . rivaroxaban  15 mg Oral BID WC   Followed by  . rivaroxaban  20 mg Oral Daily  . DISCONTD: enoxaparin (LOVENOX) injection  80 mg Subcutaneous Q12H  . DISCONTD: rivaroxaban  20 mg Oral Daily  . DISCONTD: rivaroxaban  15 mg Oral BID  . DISCONTD: warfarin  5 mg Oral ONCE-1800  . DISCONTD: warfarin   Does not apply Once   Continuous Infusions:    . DISCONTD: sodium chloride 75 mL/hr at 04/27/11 0643   PRN Meds:.acetaminophen, acetaminophen,  gi cocktail, ondansetron (ZOFRAN) IV, ondansetron, traMADol  Assessment/Plan: 1. Pulmonary embolism.  Likely due to DVT in the left lower extremity may be from left ankle fracture from immobilization. Continue Rivaroxaban 15 mg twice daily with meals for 21 days total and then 20 mg daily thereafter.  Talked to the case manager to help determine patient's co-pay given the patient is on Medicare.  Saturated to 97% lowest with walking with physical therapy.  2.  DM (diabetes mellitus), type 2, uncontrolled with complications.  Not well controlled.  Started the patient on Novolog 70/30, instructed the patient to discuss with PCP for 70/30 versus lantus. Given patient has a donut hole with insurance (at the end of the year insurance will pay for the medication up to a certain point and after which  it is unaffordable for the patient and cannot get her lantus, hence may benefit from 70/30).  3.  Left leg DVT.  Anticoagulation as indicated above.  4.  Ankle fracture, left.  From trauma.  Management as per orthopedics as outpatient.  5.  Generalized weakness and deconditioning.  Will have PT evaluate the patient.  Left leg DVT  6.  Prophylaxis. Rivaroxaban.  7.  Disposition.  Discharge the patient home.   LOS: 3 days  Tyann Niehaus A, MD 04/28/2011, 11:53 AM

## 2011-04-28 NOTE — Discharge Summary (Signed)
Discharge Summary  Sandra [name] MR#: [id number]  DOB:04/05/1941  Date of Admission: 04/25/2011 Date of Discharge: 04/28/2011  Patient's PCP: Ignatius Specking., MD, MD  Attending Physician:Gradie Butrick A  Consults: None  Discharge Diagnoses: Principal Problem:  *Pulmonary embolism Active Problems:  DM (diabetes mellitus), type 2, uncontrolled with complications  Ankle fracture, left  Left leg DVT  Hypertension  Arthritis.  Brief Admitting History and Physical This 71-year-old Caucasian female with history of hypertension, type 2 diabetes, recent left ankle fracture presented on 04/26/2011 with shortness of breath.  Discharge Medications Current Discharge Medication List    START taking these medications   Details  insulin aspart protamine-insulin aspart (NOVOLOG 70/30) (70-30) 100 UNIT/ML injection Inject 10 Units into the skin 2 (two) times daily with a meal. Qty: 30 mL, Refills: 0    UNABLE TO FIND Take RIVAROXABAN 15 mg po BID with meals for 20 days, then RIVAROXABAN 20 mg oral daily thereafter. Please give a total of 30 day supple of medications. Qty: 50 tablet, Refills: 0      CONTINUE these medications which have NOT CHANGED   Details  amLODipine (NORVASC) 5 MG tablet Take 5 mg by mouth daily.      Calcium Carbonate-Vit D-Min (CALCIUM 1200 PO) Take 1 capsule by mouth 2 (two) times daily.      diclofenac (VOLTAREN) 75 MG EC tablet Take 75 mg by mouth 2 (two) times daily as needed. For pain     estrogens, conjugated, (PREMARIN) 0.9 MG tablet Take 0.9 mg by mouth daily. Take daily for 21 days then do not take for 7 days.     fish oil-omega-3 fatty acids 1000 MG capsule Take 2 g by mouth daily.      FLUoxetine (PROZAC) 20 MG capsule Take 20 mg by mouth daily.      glimepiride (AMARYL) 4 MG tablet Take 4 mg by mouth 2 (two) times daily.      metFORMIN (GLUCOPHAGE) 500 MG tablet Take 500 mg by mouth 2 (two) times daily with a meal.      traMADol (ULTRAM) 50 MG tablet  Take 50 mg by mouth 2 (two) times daily as needed. For pain       STOP taking these medications     aspirin 325 MG tablet         Hospital Course: 1. Pulmonary embolism. Likely due to DVT in the left lower extremity may be from left ankle fracture from immobilization.  Initially the patient was started on Coumadin and Lovenox which was transitioned to Rivaroxaban 15 mg twice daily with meals for 21 days total and then 20 mg daily thereafter.  Oxygen saturation was 97% lowest with walking with physical therapy.  Patient was instructed to follow up with her primary care physician for refills on Rivaroxaban and to determine when this should be discontinued.  2. DM (diabetes mellitus), type 2, uncontrolled with complications.  Blood sugars not controlled. Started the patient on Novolog 70/30, instructed the patient to discuss with PCP for 70/30 versus lantus. Given patient has a donut hole with insurance (at the end of the year, insurance will pay for the medication up to a certain point and after which it is unaffordable for the patient and cannot get her lantus, hence may benefit from 70/30).  Give the patient a prescription for NovoLog 70/30 at discharge.  3. Left leg DVT. Anticoagulation as indicated above.   4. Ankle fracture, left. From trauma. Management as per orthopedics as outpatient.  5. Generalized weakness and deconditioning.  No physical therapy needs identified after evaluation.  Arrange for a rolling walker at discharge.   Day of Discharge BP 129/56  Pulse 74  Temp(Src) 97.9 F (36.6 C) (Oral)  Resp 18  Ht 5\' 3"  (1.6 m)  Wt 77.837 kg (171 lb 9.6 oz)  BMI 30.40 kg/m2  SpO2 96%  Results for orders placed during the hospital encounter of 04/25/11 (from the past 48 hour(s))  PROTIME-INR     Status: Normal   Collection Time   04/26/11  4:02 PM      Component Value Range Comment   Prothrombin Time 14.3  11.6 - 15.2 (seconds)    INR 1.09  0.00 - 1.49    GLUCOSE, CAPILLARY      Status: Abnormal   Collection Time   04/26/11  4:52 PM      Component Value Range Comment   Glucose-Capillary 239 (*) 70 - 99 (mg/dL)    Comment 1 Notify RN     CARDIAC PANEL(CRET KIN+CKTOT+MB+TROPI)     Status: Normal   Collection Time   04/26/11  6:25 PM      Component Value Range Comment   Total CK 38  7 - 177 (U/L)    CK, MB 1.9  0.3 - 4.0 (ng/mL)    Troponin I <0.30  <0.30 (ng/mL)    Relative Index RELATIVE INDEX IS INVALID  0.0 - 2.5    GLUCOSE, CAPILLARY     Status: Abnormal   Collection Time   04/26/11  9:44 PM      Component Value Range Comment   Glucose-Capillary 254 (*) 70 - 99 (mg/dL)    Comment 1 Notify RN     GLUCOSE, CAPILLARY     Status: Abnormal   Collection Time   04/27/11  6:05 AM      Component Value Range Comment   Glucose-Capillary 140 (*) 70 - 99 (mg/dL)    Comment 1 Notify RN     BASIC METABOLIC PANEL     Status: Abnormal   Collection Time   04/27/11  7:00 AM      Component Value Range Comment   Sodium 141  135 - 145 (mEq/L)    Potassium 3.8  3.5 - 5.1 (mEq/L)    Chloride 108  96 - 112 (mEq/L)    CO2 25  19 - 32 (mEq/L)    Glucose, Bld 141 (*) 70 - 99 (mg/dL)    BUN 9  6 - 23 (mg/dL)    Creatinine, Ser 4.09  0.50 - 1.10 (mg/dL)    Calcium 9.1  8.4 - 10.5 (mg/dL)    GFR calc non Af Amer >90  >90 (mL/min)    GFR calc Af Amer >90  >90 (mL/min)   PROTIME-INR     Status: Normal   Collection Time   04/27/11  7:00 AM      Component Value Range Comment   Prothrombin Time 14.1  11.6 - 15.2 (seconds)    INR 1.07  0.00 - 1.49    CBC     Status: Abnormal   Collection Time   04/27/11  7:00 AM      Component Value Range Comment   WBC 4.7  4.0 - 10.5 (K/uL)    RBC 4.32  3.87 - 5.11 (MIL/uL)    Hemoglobin 12.3  12.0 - 15.0 (g/dL)    HCT 81.1  91.4 - 78.2 (%)    MCV 85.6  78.0 - 100.0 (fL)  MCH 28.5  26.0 - 34.0 (pg)    MCHC 33.2  30.0 - 36.0 (g/dL)    RDW 16.1  09.6 - 04.5 (%)    Platelets 140 (*) 150 - 400 (K/uL)   GLUCOSE, CAPILLARY     Status: Abnormal    Collection Time   04/27/11 11:25 AM      Component Value Range Comment   Glucose-Capillary 269 (*) 70 - 99 (mg/dL)   GLUCOSE, CAPILLARY     Status: Abnormal   Collection Time   04/27/11  4:36 PM      Component Value Range Comment   Glucose-Capillary 246 (*) 70 - 99 (mg/dL)   GLUCOSE, CAPILLARY     Status: Abnormal   Collection Time   04/27/11  9:57 PM      Component Value Range Comment   Glucose-Capillary 230 (*) 70 - 99 (mg/dL)    Comment 1 Notify RN     CBC     Status: Abnormal   Collection Time   04/28/11  5:50 AM      Component Value Range Comment   WBC 5.1  4.0 - 10.5 (K/uL)    RBC 4.36  3.87 - 5.11 (MIL/uL)    Hemoglobin 12.5  12.0 - 15.0 (g/dL)    HCT 40.9  81.1 - 91.4 (%)    MCV 84.9  78.0 - 100.0 (fL)    MCH 28.7  26.0 - 34.0 (pg)    MCHC 33.8  30.0 - 36.0 (g/dL)    RDW 78.2  95.6 - 21.3 (%)    Platelets 143 (*) 150 - 400 (K/uL)   BASIC METABOLIC PANEL     Status: Abnormal   Collection Time   04/28/11  5:50 AM      Component Value Range Comment   Sodium 140  135 - 145 (mEq/L)    Potassium 3.6  3.5 - 5.1 (mEq/L)    Chloride 105  96 - 112 (mEq/L)    CO2 23  19 - 32 (mEq/L)    Glucose, Bld 137 (*) 70 - 99 (mg/dL)    BUN 10  6 - 23 (mg/dL)    Creatinine, Ser 0.86 (*) 0.50 - 1.10 (mg/dL)    Calcium 9.0  8.4 - 10.5 (mg/dL)    GFR calc non Af Amer >90  >90 (mL/min)    GFR calc Af Amer >90  >90 (mL/min)   GLUCOSE, CAPILLARY     Status: Abnormal   Collection Time   04/28/11  6:39 AM      Component Value Range Comment   Glucose-Capillary 153 (*) 70 - 99 (mg/dL)    Comment 1 Notify RN     GLUCOSE, CAPILLARY     Status: Abnormal   Collection Time   04/28/11 12:06 PM      Component Value Range Comment   Glucose-Capillary 247 (*) 70 - 99 (mg/dL)     Dg Ankle Complete Left  03/30/2011  *RADIOLOGY REPORT*  Clinical Data: Fall.  Ankle injury.  Lateral ankle pain and swelling.  LEFT ANKLE COMPLETE - 3+ VIEW  Comparison: None.  Findings: Marked lateral soft tissue swelling is seen.  A  nondisplaced oblique fracture is seen involving the distal fibula. No other fractures are identified.  The ankle mortise is intact, and the talus is centered within the mortise.  Ankle joint effusion is also noted.  IMPRESSION: Nondisplaced oblique fracture of the distal fibula.  Original Report Authenticated By: Danae Orleans, M.D.   Ct Angio  Chest W/cm &/or Wo Cm  04/25/2011  *RADIOLOGY REPORT*  Clinical Data:  Shortness of breath.  CT ANGIOGRAPHY CHEST WITH CONTRAST  Technique:  Multidetector CT imaging of the chest was performed using the standard protocol during bolus administration of intravenous contrast.  Multiplanar CT image reconstructions including MIPs were obtained to evaluate the vascular anatomy.  Contrast: OMNIPAQUE IOHEXOL 300 MG/ML IV SOLN  Comparison:  None.  Findings:  There are large bilateral pulmonary emboli, right worse than left.  Emboli involve all lobes of both lungs.  This is most pronounced in the right upper and right lower lobes.  Dependent atelectasis in the lungs.  Otherwise no confluent opacities.  No effusions.  Heart is normal size.  Aorta is normal caliber. No mediastinal, hilar, or axillary adenopathy.  Visualized thyroid and chest wall soft tissues unremarkable. Imaging into the upper abdomen shows no acute findings.  Multiple low-density lesions in the liver, most compatible with cysts.  No acute bony abnormality.  Review of the MIP images confirms the above findings.  IMPRESSION: Large bilateral pulmonary emboli, right greater than left.  Dependent atelectasis in the in the lungs bilaterally.  Critical Value/emergent results were called by telephone at the time of interpretation on 04/25/2011  at 11:25 p.m.  to  Dr. Juleen China, who verbally acknowledged these results.  Original Report Authenticated By: Cyndie Chime, M.D.    Disposition: Home with a rolling walker.  Diet: Diabetic diet.  Activity: Resume as tolerated as per her orthopedic physician.   Follow-up  Appts: Discharge Orders    Future Orders Please Complete By Expires   Diet Carb Modified      Increase activity slowly      Discharge instructions      Comments:   Followup with VYAS,DHRUV B., MD (PCP) in 1 week. Please discuss with your PCP as to getting refills for RIVAROXABAN, and when to have it be discontinued.       TESTS THAT NEED FOLLOW-UP None  Time spent on discharge, talking to the patient, and coordinating care: 35 mins.   Signed: Cristal Ford, MD 04/28/2011, 12:35 PM

## 2011-05-02 ENCOUNTER — Other Ambulatory Visit: Payer: Self-pay

## 2011-05-03 ENCOUNTER — Encounter: Payer: Self-pay | Admitting: Internal Medicine

## 2011-05-03 DIAGNOSIS — I2789 Other specified pulmonary heart diseases: Secondary | ICD-10-CM

## 2011-07-22 DIAGNOSIS — I340 Nonrheumatic mitral (valve) insufficiency: Secondary | ICD-10-CM

## 2011-07-22 HISTORY — DX: Nonrheumatic mitral (valve) insufficiency: I34.0

## 2011-08-21 ENCOUNTER — Encounter: Payer: Self-pay | Admitting: Cardiovascular Disease

## 2011-08-21 ENCOUNTER — Ambulatory Visit (INDEPENDENT_AMBULATORY_CARE_PROVIDER_SITE_OTHER): Payer: Medicare Other | Admitting: Cardiovascular Disease

## 2011-08-21 DIAGNOSIS — I340 Nonrheumatic mitral (valve) insufficiency: Secondary | ICD-10-CM | POA: Insufficient documentation

## 2011-08-21 DIAGNOSIS — R0789 Other chest pain: Secondary | ICD-10-CM | POA: Insufficient documentation

## 2011-08-21 DIAGNOSIS — R079 Chest pain, unspecified: Secondary | ICD-10-CM

## 2011-08-21 DIAGNOSIS — I2699 Other pulmonary embolism without acute cor pulmonale: Secondary | ICD-10-CM

## 2011-08-21 DIAGNOSIS — I059 Rheumatic mitral valve disease, unspecified: Secondary | ICD-10-CM

## 2011-08-21 DIAGNOSIS — R002 Palpitations: Secondary | ICD-10-CM

## 2011-08-21 NOTE — Patient Instructions (Signed)
The current medical regimen is effective;  continue present plan and medications.  Your physician has requested that you have a lexiscan myoview. For further information please visit [email or web address] Please follow instruction sheet, as given.  Your physician has recommended that you wear a holter monitor for 48 hours. Holter monitors are medical devices that record the heart's electrical activity. Doctors most often use these monitors to diagnose arrhythmias. Arrhythmias are problems with the speed or rhythm of the heartbeat. The monitor is a small, portable device. You can wear one while you do your normal daily activities. This is usually used to diagnose what is causing palpitations/syncope (passing out).

## 2011-08-21 NOTE — Assessment & Plan Note (Signed)
I agree with anticoagulation for a minimum of 6 months.

## 2011-08-21 NOTE — Assessment & Plan Note (Signed)
She had a recent echo which showed moderate mitral regurgitation. The leaflets were described as mildly thickened. Left atrial size was normal. I do not hear a heart murmur by physical exam. The patient does not seem to be symptomatic and can be followed clinically.   

## 2011-08-21 NOTE — Progress Notes (Signed)
HPI  This is a 71 year old female who was referred by Dr. Sherril Croon for evaluation of palpitations and atypical chest pain. She has no known previous cardiac history but she has reported palpitations for many years without documented arrhythmia. She has chronic medical conditions that include type 2 diabetes and hypertension. In December of 2012 she had a left ankle fracture after a fall. She presented in January of this year with bilateral pulmonary embolism and left lower extremity DVT. She has been on Xarelto to since then. She noticed increased palpitations recently over the last few weeks. The patient describes skipping sensation in her heart as well as fast heartbeats associated with mild dizziness. There has been no syncope or presyncope. She had few episodes of substernal aching sensation lasting for only a minute or 2. Her dyspnea has improved with anticoagulation. She does complain of anxiety symptoms.  No Known Allergies   Current Outpatient Prescriptions on File Prior to Visit  Medication Sig Dispense Refill  . amLODipine (NORVASC) 5 MG tablet Take 5 mg by mouth daily.        . Calcium Carbonate-Vit D-Min (CALCIUM 1200 PO) Take 1 capsule by mouth 2 (two) times daily.        Marland Kitchen FLUoxetine (PROZAC) 20 MG capsule Take 20 mg by mouth daily.        Marland Kitchen glimepiride (AMARYL) 4 MG tablet Take 4 mg by mouth 2 (two) times daily.        . insulin aspart protamine-insulin aspart (NOVOLOG 70/30) (70-30) 100 UNIT/ML injection Inject 10 Units into the skin 2 (two) times daily with a meal.  30 mL  0  . metFORMIN (GLUCOPHAGE) 500 MG tablet Take 500 mg by mouth 2 (two) times daily with a meal.        . rivaroxaban (XARELTO) 10 MG TABS tablet Take 20 mg by mouth daily.      . traMADol (ULTRAM) 50 MG tablet Take 50 mg by mouth 2 (two) times daily as needed. For pain       . UNABLE TO FIND Take RIVAROXABAN 15 mg po BID with meals for 20 days, then RIVAROXABAN 20 mg oral daily thereafter. Please give a total of  30 day supple of medications.  50 tablet  0     Past Medical History  Diagnosis Date  . Diabetes mellitus   . Hypertension   . Arthritis   . Pulmonary embolism 04/2011  . DVT (deep venous thrombosis), left 04/2011    after an ankle fracture.   . Mitral regurgitation 07/2011    moderate     Past Surgical History  Procedure Date  . Abdominal hysterectomy   . Hernia repair   . Foot surgery   . Cataract extraction      History reviewed. No pertinent family history.   History   Social History  . Marital Status: Divorced    Spouse Name: N/A    Number of Children: N/A  . Years of Education: N/A   Occupational History  . Not on file.   Social History Main Topics  . Smoking status: Never Smoker   . Smokeless tobacco: Not on file  . Alcohol Use: No  . Drug Use: No  . Sexually Active:    Other Topics Concern  . Not on file   Social History Narrative  . No narrative on file     ROS Constitutional: Negative for fever, chills, diaphoresis, activity change, appetite change and fatigue.  HENT: Negative for  hearing loss, nosebleeds, congestion, sore throat, facial swelling, drooling, trouble swallowing, neck pain, voice change, sinus pressure and tinnitus.  Eyes: Negative for photophobia, pain, discharge and visual disturbance.  Respiratory: Negative for apnea, cough and wheezing.  Cardiovascular: Negative for  and leg swelling.  Gastrointestinal: Negative for nausea, vomiting, abdominal pain, diarrhea, constipation, blood in stool and abdominal distention.  Genitourinary: Negative for dysuria, urgency, frequency, hematuria and decreased urine volume.  Musculoskeletal: Negative for myalgias, back pain, joint swelling, arthralgias and gait problem.  Skin: Negative for color change, pallor, rash and wound.  Neurological: Negative for dizziness, tremors, seizures, syncope, speech difficulty, weakness, light-headedness, numbness and headaches.  Psychiatric/Behavioral:  Negative for suicidal ideas, hallucinations, behavioral problems and agitation. The patient is not nervous/anxious.     PHYSICAL EXAM   BP 159/64  Pulse 60  Ht 5\' 3"  (1.6 m)  Wt 166 lb 1.9 oz (75.352 kg)  BMI 29.43 kg/m2 Constitutional: She is oriented to person, place, and time. She appears well-developed and well-nourished. No distress.  HENT: No nasal discharge.  Head: Normocephalic and atraumatic.  Eyes: Pupils are equal and round. Right eye exhibits no discharge. Left eye exhibits no discharge.  Neck: Normal range of motion. Neck supple. No JVD present. No thyromegaly present.  Cardiovascular: Normal rate, regular rhythm, normal heart sounds. Exam reveals no gallop and no friction rub. No murmur heard.  Pulmonary/Chest: Effort normal and breath sounds normal. No stridor. No respiratory distress. She has no wheezes. She has no rales. She exhibits no tenderness.  Abdominal: Soft. Bowel sounds are normal. She exhibits no distension. There is no tenderness. There is no rebound and no guarding.  Musculoskeletal: Normal range of motion. She exhibits no edema and no tenderness.  Neurological: She is alert and oriented to person, place, and time. Coordination normal.  Skin: Skin is warm and dry. No rash noted. She is not diaphoretic. No erythema. No pallor.  Psychiatric: She has a normal mood and affect. Her behavior is normal. Judgment and thought content normal.     EKG: Recent ECG showed normal sinus rhythm with no significant ST or T wave changes.    ASSESSMENT AND PLAN

## 2011-08-21 NOTE — Assessment & Plan Note (Signed)
The patient has prolonged history of palpitations without documented arrhythmia. Her symptoms have worsened recently and almost happening on a daily basis. Her baseline ECG is unremarkable. She had a recent echo done at Dr. Sherril Croon office which showed normal LV systolic function, moderate mitral regurgitation and mild tricuspid regurgitation. Some of these palpitations are associated with anxiety which might be the triggering factor. However, arrhythmia will have to be evaluated as well. I recommend a 48 hour Holter monitor.

## 2011-08-21 NOTE — Assessment & Plan Note (Signed)
The patient had brief and atypical episodes of chest pain. She has multiple risk factors for coronary artery disease and thus I recommend a pharmacologic nuclear stress test. She is not able to exercise on a treadmill due to significant arthritis.

## 2011-08-27 ENCOUNTER — Ambulatory Visit (HOSPITAL_COMMUNITY): Payer: Medicare Other | Attending: Cardiology | Admitting: Radiology

## 2011-08-27 ENCOUNTER — Encounter (INDEPENDENT_AMBULATORY_CARE_PROVIDER_SITE_OTHER): Payer: Medicare Other

## 2011-08-27 DIAGNOSIS — R0989 Other specified symptoms and signs involving the circulatory and respiratory systems: Secondary | ICD-10-CM | POA: Insufficient documentation

## 2011-08-27 DIAGNOSIS — E785 Hyperlipidemia, unspecified: Secondary | ICD-10-CM | POA: Insufficient documentation

## 2011-08-27 DIAGNOSIS — R61 Generalized hyperhidrosis: Secondary | ICD-10-CM | POA: Insufficient documentation

## 2011-08-27 DIAGNOSIS — R0602 Shortness of breath: Secondary | ICD-10-CM

## 2011-08-27 DIAGNOSIS — R0789 Other chest pain: Secondary | ICD-10-CM | POA: Insufficient documentation

## 2011-08-27 DIAGNOSIS — R002 Palpitations: Secondary | ICD-10-CM

## 2011-08-27 DIAGNOSIS — R079 Chest pain, unspecified: Secondary | ICD-10-CM

## 2011-08-27 DIAGNOSIS — I4949 Other premature depolarization: Secondary | ICD-10-CM

## 2011-08-27 DIAGNOSIS — I10 Essential (primary) hypertension: Secondary | ICD-10-CM | POA: Insufficient documentation

## 2011-08-27 DIAGNOSIS — Z794 Long term (current) use of insulin: Secondary | ICD-10-CM | POA: Insufficient documentation

## 2011-08-27 DIAGNOSIS — R42 Dizziness and giddiness: Secondary | ICD-10-CM | POA: Insufficient documentation

## 2011-08-27 DIAGNOSIS — R000 Tachycardia, unspecified: Secondary | ICD-10-CM | POA: Insufficient documentation

## 2011-08-27 DIAGNOSIS — E119 Type 2 diabetes mellitus without complications: Secondary | ICD-10-CM | POA: Insufficient documentation

## 2011-08-27 DIAGNOSIS — R0609 Other forms of dyspnea: Secondary | ICD-10-CM | POA: Insufficient documentation

## 2011-08-27 DIAGNOSIS — R5383 Other fatigue: Secondary | ICD-10-CM | POA: Insufficient documentation

## 2011-08-27 DIAGNOSIS — R5381 Other malaise: Secondary | ICD-10-CM | POA: Insufficient documentation

## 2011-08-27 DIAGNOSIS — Z8249 Family history of ischemic heart disease and other diseases of the circulatory system: Secondary | ICD-10-CM | POA: Insufficient documentation

## 2011-08-27 MED ORDER — REGADENOSON 0.4 MG/5ML IV SOLN
0.4000 mg | Freq: Once | INTRAVENOUS | Status: AC
  Administered 2011-08-27: 0.4 mg via INTRAVENOUS

## 2011-08-27 MED ORDER — TECHNETIUM TC 99M TETROFOSMIN IV KIT
32.9000 | PACK | Freq: Once | INTRAVENOUS | Status: AC | PRN
  Administered 2011-08-27: 32.9 via INTRAVENOUS

## 2011-08-27 MED ORDER — TECHNETIUM TC 99M TETROFOSMIN IV KIT
10.9000 | PACK | Freq: Once | INTRAVENOUS | Status: AC | PRN
  Administered 2011-08-27: 10.9 via INTRAVENOUS

## 2011-08-27 NOTE — Progress Notes (Signed)
[facility] SITE 3 NUCLEAR MED [address] [place] [address] [phone]  Cardiology Nuclear Med Study  Sandra [name] is a 71 y.o. female     MRN : [id number]     DOB: 08-21-1940  Procedure Date: 08/27/2011  Nuclear Med Background Indication for Stress Test:  Evaluation for Ischemia History: ~5 yrs ago [id number] per patient  Cardiac Risk Factors: Family History - CAD, Hypertension, IDDM Type 2 and Lipids  Symptoms:  Chest Pain/Tightness (last episode of chest discomfort: today,2/10), Diaphoresis, Dizziness, DOE, Fatigue, Palpitations and Rapid HR   Nuclear Pre-Procedure Caffeine/Decaff Intake:  None NPO After: 8:30am   Lungs:  Clear. O2 Sat: 97% on room air. IV 0.9% NS with Angio Cath:  20g  IV Site: R Antecubital  IV Started by:  Stanton Kidney, EMT-P  Chest Size (in):  40 Cup Size: C  Height: 5\' 3"  (1.6 m)  Weight:  165 lb (74.844 kg)  BMI:  Body mass index is 29.23 kg/(m^2). Tech Comments:  CBG=164@8 :00am, per patient.    Nuclear Med Study 1 or 2 day study: 1 day  Stress Test Type:  Lexiscan  Reading MD: Willa Rough, MD  Order Authorizing Provider:  Lorine Bears, MD  Resting Radionuclide: Technetium 35m Tetrofosmin  Resting Radionuclide Dose: 10.9 mCi   Stress Radionuclide:  Technetium 64m Tetrofosmin  Stress Radionuclide Dose: 32.9 mCi           Stress Protocol Rest HR: 84 Stress HR: 120  Rest BP: 147/81 Stress BP: 136/62  Exercise Time (min): n/a METS: n/a   Predicted Max HR: 151 bpm % Max HR: 79.47 bpm Rate Pressure Product: 46962   Dose of Adenosine (mg):  n/a Dose of Lexiscan: 0.4 mg  Dose of Atropine (mg): n/a Dose of Dobutamine: n/a mcg/kg/min (at max HR)  Stress Test Technologist: Smiley Houseman, CMA-N  Nuclear Technologist:  Domenic Polite, CNMT     Rest Procedure:  Myocardial perfusion imaging was performed at rest 45 minutes following the intravenous administration of Technetium 54m Tetrofosmin.  Rest ECG: No acute  changes.  Stress Procedure:  The patient received IV Lexiscan 0.4 mg over 15-seconds.  Technetium 37m Tetrofosmin injected at 30-seconds.  There were nonspecific ST-T wave changes with Lexiscan bolus, frequent PVC's were noted.   She did c/o chest tightness, 5/10.Quantitative spect images were obtained after a 45 minute delay.  Stress ECG: No significant change from baseline ECG  QPS Raw Data Images:  Patient motion noted; appropriate software correction applied. Stress Images:  Mild anterior breast attenuation Rest Images:  Same as stress Subtraction (SDS):  No evidence of ischemia. Transient Ischemic Dilatation (Normal <1.22):  1.15 Lung/Heart Ratio (Normal <0.45):  0.43  Quantitative Gated Spect Images QGS EDV:  71 ml QGS ESV:  30 ml  Impression Exercise Capacity:  Lexiscan with no exercise. BP Response:  Normal blood pressure response. Clinical Symptoms:  5/10 Chest tightness ECG Impression:  No significant ST segment change suggestive of ischemia. Comparison with Prior Nuclear Study: No previous nuclear study performed  Overall Impression:  Normal stress nuclear study. There is slight anterior breast attenuation. There is no significant abnormality.  LV Ejection Fraction: 58%.  LV Wall Motion:  Normal Wall Motion  Jerral Bonito, MD

## 2011-09-04 ENCOUNTER — Other Ambulatory Visit: Payer: Self-pay

## 2011-09-04 MED ORDER — METOPROLOL SUCCINATE ER 25 MG PO TB24: 25.0000 mg | ORAL_TABLET | Freq: Every day | ORAL | Status: DC

## 2011-09-11 ENCOUNTER — Ambulatory Visit: Payer: Medicare Other | Admitting: Cardiology

## 2011-10-09 ENCOUNTER — Ambulatory Visit (INDEPENDENT_AMBULATORY_CARE_PROVIDER_SITE_OTHER): Payer: Medicare Other | Admitting: Cardiovascular Disease

## 2011-10-09 ENCOUNTER — Encounter: Payer: Self-pay | Admitting: Cardiovascular Disease

## 2011-10-09 VITALS — BP 142/70 | HR 70 | Ht 60.0 in | Wt 167.0 lb

## 2011-10-09 DIAGNOSIS — I059 Rheumatic mitral valve disease, unspecified: Secondary | ICD-10-CM

## 2011-10-09 DIAGNOSIS — R0789 Other chest pain: Secondary | ICD-10-CM

## 2011-10-09 DIAGNOSIS — R002 Palpitations: Secondary | ICD-10-CM

## 2011-10-09 DIAGNOSIS — I340 Nonrheumatic mitral (valve) insufficiency: Secondary | ICD-10-CM

## 2011-10-09 MED ORDER — METOPROLOL SUCCINATE ER 50 MG PO TB24: 50.0000 mg | ORAL_TABLET | Freq: Every day | ORAL | Status: DC

## 2011-10-09 NOTE — Patient Instructions (Addendum)
Your physician has recommended you make the following change in your medication: Increase your Toprol (Metoprolol) to 50mg  daily  Your physician wants you to follow-up in: 6 months.   You will receive a reminder letter in the mail two months in advance. If you don't receive a letter, please call our office to schedule the follow-up appointment.

## 2011-10-10 NOTE — Progress Notes (Signed)
HPI  This is a 71 year old female who is here today regarding  palpitations and atypical chest pain. She has no known previous cardiac history but she has reported palpitations for many years without documented arrhythmia. She has chronic medical conditions that include type 2 diabetes and hypertension. In December of 2012 she had a left ankle fracture after a fall. She presented in January of this year with bilateral pulmonary embolism and left lower extremity DVT. She has been on Xarelto to since then. She noticed increased palpitations recently over the last few weeks. The patient describes skipping sensation in her heart as well as fast heartbeats associated with mild dizziness. There has been no syncope or presyncope. She had few episodes of substernal aching sensation lasting for only a minute or 2. Her dyspnea has improved with anticoagulation. She does complain of anxiety symptoms.  She was evaluated with a 48-hour Holter monitor which showed short runs of supraventricular tachycardia and frequent PVCs. She underwent a nuclear stress test which was normal. She was started on Toprol 25 mg once daily. Her symptoms improved with that treatment.     Allergies  Allergen Reactions  . Buprenex (Buprenorphine)      Current Outpatient Prescriptions on File Prior to Visit  Medication Sig Dispense Refill  . amLODipine (NORVASC) 5 MG tablet Take 5 mg by mouth daily.       . Calcium Carbonate-Vit D-Min (CALCIUM 1200 PO) Take 1 capsule by mouth daily.       Marland Kitchen FLUoxetine (PROZAC) 20 MG capsule Take 20 mg by mouth daily.        Marland Kitchen glimepiride (AMARYL) 4 MG tablet Take 4 mg by mouth 2 (two) times daily.        . insulin aspart protamine-insulin aspart (NOVOLOG 70/30) (70-30) 100 UNIT/ML injection Inject into the skin as directed.      . insulin glargine (LANTUS) 100 UNIT/ML injection Inject into the skin at bedtime.      . metFORMIN (GLUCOPHAGE) 500 MG tablet Take 500 mg by mouth 2 (two) times daily  with a meal.        . metoprolol succinate (TOPROL XL) 50 MG 24 hr tablet Take 1 tablet (50 mg total) by mouth daily.  30 tablet  6  . rivaroxaban (XARELTO) 10 MG TABS tablet Take 20 mg by mouth daily.      . traMADol (ULTRAM) 50 MG tablet Take 50 mg by mouth 2 (two) times daily as needed. For pain          Past Medical History  Diagnosis Date  . Diabetes mellitus   . Hypertension   . Arthritis   . Pulmonary embolism 04/2011  . DVT (deep venous thrombosis), left 04/2011    after an ankle fracture.   . Mitral regurgitation 07/2011    moderate     Past Surgical History  Procedure Date  . Abdominal hysterectomy   . Hernia repair   . Foot surgery   . Cataract extraction      No family history on file.   History   Social History  . Marital Status: Divorced    Spouse Name: N/A    Number of Children: N/A  . Years of Education: N/A   Occupational History  . Not on file.   Social History Main Topics  . Smoking status: Never Smoker   . Smokeless tobacco: Not on file  . Alcohol Use: No  . Drug Use: No  . Sexually Active:  Other Topics Concern  . Not on file   Social History Narrative  . No narrative on file     PHYSICAL EXAM   BP 142/70  Pulse 70  Ht 5' (1.524 m)  Wt 167 lb (75.751 kg)  BMI 32.62 kg/m2  Constitutional: She is oriented to person, place, and time. She appears well-developed and well-nourished. No distress.  HENT: No nasal discharge.  Head: Normocephalic and atraumatic.  Eyes: Pupils are equal and round. Right eye exhibits no discharge. Left eye exhibits no discharge.  Neck: Normal range of motion. Neck supple. No JVD present. No thyromegaly present.  Cardiovascular: Normal rate, regular rhythm, normal heart sounds. Exam reveals no gallop and no friction rub. No murmur heard.  Pulmonary/Chest: Effort normal and breath sounds normal. No stridor. No respiratory distress. She has no wheezes. She has no rales. She exhibits no tenderness.    Abdominal: Soft. Bowel sounds are normal. She exhibits no distension. There is no tenderness. There is no rebound and no guarding.  Musculoskeletal: Normal range of motion. She exhibits no edema and no tenderness.  Neurological: She is alert and oriented to person, place, and time. Coordination normal.  Skin: Skin is warm and dry. No rash noted. She is not diaphoretic. No erythema. No pallor.  Psychiatric: She has a normal mood and affect. Her behavior is normal. Judgment and thought content normal.     ASSESSMENT AND PLAN

## 2011-10-10 NOTE — Assessment & Plan Note (Signed)
She had a recent echo which showed moderate mitral regurgitation. The leaflets were described as mildly thickened. Left atrial size was normal. I do not hear a heart murmur by physical exam. The patient does not seem to be symptomatic and can be followed clinically.

## 2011-10-10 NOTE — Assessment & Plan Note (Signed)
Short runs of SVT and frequent PVCs noted on Holter monitor. I recommend increasing the dose of Toprol to 50 mg once daily. I asked her to start an exercise program. Given her arthritis, I recommend water aerobics.

## 2011-10-10 NOTE — Assessment & Plan Note (Signed)
Likely noncardiac. Nuclear stress test was normal. Symptoms improved after treatment with metoprolol.

## 2012-02-26 ENCOUNTER — Ambulatory Visit (INDEPENDENT_AMBULATORY_CARE_PROVIDER_SITE_OTHER): Payer: Medicare Other | Admitting: Cardiovascular Disease

## 2012-02-26 ENCOUNTER — Telehealth: Payer: Self-pay | Admitting: Cardiovascular Disease

## 2012-02-26 ENCOUNTER — Encounter: Payer: Self-pay | Admitting: Cardiovascular Disease

## 2012-02-26 VITALS — BP 158/62 | HR 78 | Ht 63.0 in | Wt 165.0 lb

## 2012-02-26 DIAGNOSIS — R002 Palpitations: Secondary | ICD-10-CM

## 2012-02-26 DIAGNOSIS — R0600 Dyspnea, unspecified: Secondary | ICD-10-CM

## 2012-02-26 DIAGNOSIS — R0609 Other forms of dyspnea: Secondary | ICD-10-CM

## 2012-02-26 DIAGNOSIS — R0789 Other chest pain: Secondary | ICD-10-CM

## 2012-02-26 MED ORDER — PANTOPRAZOLE SODIUM 40 MG PO TBEC: 40.0000 mg | DELAYED_RELEASE_TABLET | Freq: Every day | ORAL | Status: DC

## 2012-02-26 NOTE — Telephone Encounter (Signed)
New Problem:    Patient called in wanting to see Dr. Kirke Corin as soon as possible because she is had chest pain last night, her heart feels like its quivering, she has SOB and is fatigued.

## 2012-02-26 NOTE — Patient Instructions (Addendum)
Your physician has requested that you have an echocardiogram. Echocardiography is a painless test that uses sound waves to create images of your heart. It provides your doctor with information about the size and shape of your heart and how well your heart's chambers and valves are working. This procedure takes approximately one hour. There are no restrictions for this procedure. This test will be scheduled in [place] within 1 week.  Your physician has recommended you make the following change in your medication: start taking Protonix 40 mg daily  Your physician recommends that you schedule a follow-up appointment in: after Echo

## 2012-02-26 NOTE — Telephone Encounter (Signed)
Pt states that chest pain woke her up this a.m.  Over the last few days having episodes of chest pain, sob and diaphoresis.  Advised pt to go to ED.  She refused and requested to see Dr. Kirke Corin today.  Appointment made and advised pt to go directly to ED if she has another episode of chest pain prior to appointment.  She agreed.

## 2012-02-27 ENCOUNTER — Encounter: Payer: Self-pay | Admitting: Cardiovascular Disease

## 2012-02-27 NOTE — Assessment & Plan Note (Signed)
Continue treatment with Toprol.

## 2012-02-27 NOTE — Progress Notes (Signed)
HPI  This is a 71 year old female who is here today for urgent evaluation regarding hypertension. She was seen in April of this year for palpitations and atypical chest pain. She has no known previous cardiac history but she has reported palpitations for many years without documented arrhythmia. She has chronic medical conditions that include type 2 diabetes and hypertension. In December of 2012 she had a left ankle fracture after a fall. She presented in January of this year with bilateral pulmonary embolism and left lower extremity DVT. She has been on Xarelto to since then. Her cardiac evaluation in May included a nuclear stress test which showed no evidence of ischemia with normal ejection fraction. She was evaluated with a 48-hour Holter monitor which showed short runs of supraventricular tachycardia and frequent PVCs.  She was started on Toprol 25 mg once daily which was subsequently increased to 50 mg once daily with improvement in symptoms.  She woke up this morning with chest discomfort described as tightness which lasted for about 10-15 minutes. She has been having increased heartburn over the last few days. She hasn't had any further chest pain since the episode. She does complain of being more prior than usual over the last few weeks with increased dyspnea. She feels similar to how she felt when she was diagnosed with pulmonary embolism. She denies missing any doses of Xarelto.     Allergies  Allergen Reactions  . Buprenex (Buprenorphine)      Current Outpatient Prescriptions on File Prior to Visit  Medication Sig Dispense Refill  . amLODipine (NORVASC) 5 MG tablet Take 5 mg by mouth daily.       . Calcium Carbonate-Vit D-Min (CALCIUM 1200 PO) Take 1 capsule by mouth daily.       Marland Kitchen FLUoxetine (PROZAC) 20 MG capsule Take 20 mg by mouth daily.        Marland Kitchen glimepiride (AMARYL) 4 MG tablet Take 4 mg by mouth 2 (two) times daily.        . insulin aspart protamine-insulin aspart (NOVOLOG  70/30) (70-30) 100 UNIT/ML injection Inject into the skin as directed.      . insulin glargine (LANTUS) 100 UNIT/ML injection Inject 54 Units into the skin daily.       . metFORMIN (GLUCOPHAGE) 500 MG tablet Take 500 mg by mouth 2 (two) times daily with a meal.        . metoprolol succinate (TOPROL XL) 50 MG 24 hr tablet Take 1 tablet (50 mg total) by mouth daily.  30 tablet  6  . rivaroxaban (XARELTO) 10 MG TABS tablet Take 20 mg by mouth daily.      . traMADol (ULTRAM) 50 MG tablet Take 50 mg by mouth 2 (two) times daily as needed. For pain       . pantoprazole (PROTONIX) 40 MG tablet Take 1 tablet (40 mg total) by mouth daily.  30 tablet  3     Past Medical History  Diagnosis Date  . Diabetes mellitus   . Hypertension   . Arthritis   . Pulmonary embolism 04/2011  . DVT (deep venous thrombosis), left 04/2011    after an ankle fracture.   . Mitral regurgitation 07/2011    moderate     Past Surgical History  Procedure Date  . Abdominal hysterectomy   . Hernia repair   . Foot surgery   . Cataract extraction      No family history on file.   History   Social  History  . Marital Status: Divorced    Spouse Name: N/A    Number of Children: N/A  . Years of Education: N/A   Occupational History  . Not on file.   Social History Main Topics  . Smoking status: Never Smoker   . Smokeless tobacco: Not on file  . Alcohol Use: No  . Drug Use: No  . Sexually Active:    Other Topics Concern  . Not on file   Social History Narrative  . No narrative on file     PHYSICAL EXAM   BP 158/62  Pulse 78  Ht 5\' 3"  (1.6 m)  Wt 165 lb (74.844 kg)  BMI 29.23 kg/m2  SpO2 96%  Constitutional: She is oriented to person, place, and time. She appears well-developed and well-nourished. No distress.  HENT: No nasal discharge.  Head: Normocephalic and atraumatic.  Eyes: Pupils are equal and round. Right eye exhibits no discharge. Left eye exhibits no discharge.  Neck: Normal  range of motion. Neck supple. No JVD present. No thyromegaly present.  Cardiovascular: Normal rate, regular rhythm, normal heart sounds. Exam reveals no gallop and no friction rub. No murmur heard.  Pulmonary/Chest: Effort normal and breath sounds normal. No stridor. No respiratory distress. She has no wheezes. She has no rales. She exhibits no tenderness.  Abdominal: Soft. Bowel sounds are normal. She exhibits no distension. There is no tenderness. There is no rebound and no guarding.  Musculoskeletal: Normal range of motion. She exhibits no edema and no tenderness.  Neurological: She is alert and oriented to person, place, and time. Coordination normal.  Skin: Skin is warm and dry. No rash noted. She is not diaphoretic. No erythema. No pallor.  Psychiatric: She has a normal mood and affect. Her behavior is normal. Judgment and thought content normal.   EKG: Normal sinus rhythm with nonspecific ST changes in the inferior leads.  ASSESSMENT AND PLAN

## 2012-02-27 NOTE — Assessment & Plan Note (Signed)
The patient had one episode of chest pain this morning that woke her up from sleep. She is currently chest pain-free. She has been experiencing increased symptoms of heartburn especially after sheath. The etiology of her chest pain is most likely related to GERD with possible esophageal spasm. However, with her multiple risk factors for coronary artery disease, nocturnal angina cannot be entirely excluded. A nuclear stress test this year did not show evidence of ischemia. However, I will have a low threshold for proceeding with coronary angiography if her symptoms persist. If angiography is needed, I will likely do it via the radial approach to avoid interruption of anticoagulation. For now, I will start her on Protonix 40 mg once daily. Continue other cardiac medications. She will notify us if she has recurrent symptoms. She does complain of increased dyspnea and symptoms similar to when she had her pulmonary embolism. However, she is fully anticoagulated. I will obtain an echocardiogram to evaluate LV systolic function and pulmonary pressure.

## 2012-02-28 ENCOUNTER — Encounter: Payer: Self-pay | Admitting: Cardiovascular Disease

## 2012-03-05 ENCOUNTER — Other Ambulatory Visit (INDEPENDENT_AMBULATORY_CARE_PROVIDER_SITE_OTHER): Payer: Medicare Other

## 2012-03-05 ENCOUNTER — Other Ambulatory Visit: Payer: Self-pay

## 2012-03-05 DIAGNOSIS — R0609 Other forms of dyspnea: Secondary | ICD-10-CM

## 2012-03-05 DIAGNOSIS — R0600 Dyspnea, unspecified: Secondary | ICD-10-CM

## 2012-03-24 ENCOUNTER — Telehealth (INDEPENDENT_AMBULATORY_CARE_PROVIDER_SITE_OTHER): Payer: Self-pay | Admitting: *Deleted

## 2012-03-24 NOTE — Telephone Encounter (Signed)
Sandra [name] called to see when her next TCS is due. She also would like to know when her last TCS was done. I could only find where Dr. Karilyn Cota seen her on 05/02/2003 with no report. Patient return phone number is [phone].

## 2012-04-01 NOTE — Telephone Encounter (Signed)
Per Dr Karilyn Cota patient is due now of early next year. I have spoken to Sandra [name] and she is having some cardiac issues and wants to talk to her cardiologist before we schedule TCS.  She will contact me after her appt w/ her cardiologist and I have also put her on recall for February incase she forgets to call me

## 2012-04-06 ENCOUNTER — Encounter (INDEPENDENT_AMBULATORY_CARE_PROVIDER_SITE_OTHER): Payer: Self-pay

## 2012-04-28 ENCOUNTER — Telehealth: Payer: Self-pay | Admitting: Cardiovascular Disease

## 2012-04-28 MED ORDER — PANTOPRAZOLE SODIUM 40 MG PO TBEC: 40.0000 mg | DELAYED_RELEASE_TABLET | Freq: Every day | ORAL | Status: DC

## 2012-04-28 MED ORDER — METOPROLOL SUCCINATE ER 50 MG PO TB24: 50.0000 mg | ORAL_TABLET | Freq: Every day | ORAL | Status: DC

## 2012-04-28 MED ORDER — RIVAROXABAN 10 MG PO TABS: 20.0000 mg | ORAL_TABLET | Freq: Every day | ORAL | Status: DC

## 2012-04-28 NOTE — Telephone Encounter (Signed)
Hold Xarelto 2 days before colonoscopy.  She should check with Dr. Sherril Croon to see how long he is planning to treat her with Xarelto ( it was given for pulmonary embolism and not A-fib).  Follow up with me in 2 months or earlier if she is still having chest pain.

## 2012-04-28 NOTE — Telephone Encounter (Signed)
Pt needs to know when she needs fu appt, pls call and pt due for colonoscopy needs to go off xeralto for 24-48 hrs, is this ok? Also:  Pt needs refill of xeralto and metoprolol uses [facility] [phone]

## 2012-04-28 NOTE — Telephone Encounter (Signed)
Pt called to find out when she needs to have a F/U visit with Dr Kirke Corin. She is seen here in this office. On her last visit on 02-26-12 pt's instructions said; pt to have a F/U visit after the echo. MD called pt with  the echo results already. Pt. also  Said she  is due to have a Colonoscopy soon, and needs to be off Xarelto for 24 to 48 hours prior the procedure. Refill for Xarelto,  Metoprolol succinate and protonix was send to pt's pharmacy. Pt aware.

## 2012-04-29 ENCOUNTER — Telehealth (INDEPENDENT_AMBULATORY_CARE_PROVIDER_SITE_OTHER): Payer: Self-pay | Admitting: *Deleted

## 2012-04-29 ENCOUNTER — Other Ambulatory Visit (INDEPENDENT_AMBULATORY_CARE_PROVIDER_SITE_OTHER): Payer: Self-pay | Admitting: *Deleted

## 2012-04-29 DIAGNOSIS — Z86010 Personal history of colonic polyps: Secondary | ICD-10-CM

## 2012-04-29 DIAGNOSIS — K625 Hemorrhage of anus and rectum: Secondary | ICD-10-CM

## 2012-04-29 DIAGNOSIS — Z1211 Encounter for screening for malignant neoplasm of colon: Secondary | ICD-10-CM

## 2012-04-29 MED ORDER — PEG-KCL-NACL-NASULF-NA ASC-C 100 G PO SOLR: 1.0000 | Freq: Once | ORAL | Status: DC

## 2012-04-29 NOTE — Telephone Encounter (Signed)
agree

## 2012-04-29 NOTE — Telephone Encounter (Signed)
Pt aware of MD's recommendations. She states  has not been having chest pain, pt will call to make an  Appointment to be seen, if chest pain re-occurs again.

## 2012-04-29 NOTE — Telephone Encounter (Signed)
  Procedure: tcs  Reason/Indication:  Hx polyps, rectal bleeding  Has patient had this procedure before?  yes  If so, when, by whom and where?  2005  Is there a family history of colon cancer?  no  Who?  What age when diagnosed?    Is patient diabetic?   yes      Does patient have prosthetic heart valve?  no  Do you have a pacemaker?  no  Has patient had joint replacement within last 12 months?  no  Is patient on Coumadin, Plavix and/or Aspirin? yes  Medications: xarelto 20 mg daily, metoprolol 50 mg daily, amlodipine 5 mg daily, metformin 500 mg 2 in am & 1 in pm, fluoxetine 20 mg daily, protonix 40 mgdaily, glimepiride  4 mg bid, calcium 1200 mg w/ vit d3 1000 units daily, tramadol prn, lantus pen 65 units in morning  Allergies: bupronex  Medication Adjustment: hold xarelto day before, hold evening dose of metformin day before, 1/2 glimepiride day before, decrease lantus to 35 units   Procedure date & time: 05/07/12 at 245

## 2012-05-04 ENCOUNTER — Encounter (HOSPITAL_COMMUNITY): Payer: Self-pay | Admitting: Pharmacy Technician

## 2012-05-07 ENCOUNTER — Ambulatory Visit (HOSPITAL_COMMUNITY)
Admission: RE | Admit: 2012-05-07 | Discharge: 2012-05-07 | Disposition: A | Discharge status: Home / Self Care | Payer: Medicare Other | Source: Ambulatory Visit | Attending: Internal Medicine | Admitting: Internal Medicine

## 2012-05-07 ENCOUNTER — Encounter (HOSPITAL_COMMUNITY): Payer: Self-pay | Admitting: *Deleted

## 2012-05-07 ENCOUNTER — Encounter (HOSPITAL_COMMUNITY)
Admission: RE | Disposition: A | Discharge status: Home / Self Care | Payer: Self-pay | Source: Ambulatory Visit | Attending: Internal Medicine

## 2012-05-07 DIAGNOSIS — Z860100 Personal history of colon polyps, unspecified: Secondary | ICD-10-CM | POA: Insufficient documentation

## 2012-05-07 DIAGNOSIS — K6389 Other specified diseases of intestine: Secondary | ICD-10-CM

## 2012-05-07 DIAGNOSIS — D126 Benign neoplasm of colon, unspecified: Secondary | ICD-10-CM | POA: Insufficient documentation

## 2012-05-07 DIAGNOSIS — E119 Type 2 diabetes mellitus without complications: Secondary | ICD-10-CM | POA: Insufficient documentation

## 2012-05-07 DIAGNOSIS — K625 Hemorrhage of anus and rectum: Secondary | ICD-10-CM

## 2012-05-07 DIAGNOSIS — K644 Residual hemorrhoidal skin tags: Secondary | ICD-10-CM

## 2012-05-07 DIAGNOSIS — Z01812 Encounter for preprocedural laboratory examination: Secondary | ICD-10-CM | POA: Insufficient documentation

## 2012-05-07 DIAGNOSIS — K921 Melena: Secondary | ICD-10-CM | POA: Insufficient documentation

## 2012-05-07 HISTORY — PX: COLONOSCOPY: SHX5424

## 2012-05-07 LAB — GLUCOSE, CAPILLARY: Glucose-Capillary: 149 mg/dL — ABNORMAL HIGH (ref 70–99)

## 2012-05-07 SURGERY — COLONOSCOPY
Anesthesia: Moderate Sedation

## 2012-05-07 MED ORDER — SODIUM CHLORIDE 0.45 % IV SOLN
INTRAVENOUS | Status: DC
Start: 2012-05-07 — End: 2012-05-07
  Administered 2012-05-07: 1000 mL via INTRAVENOUS

## 2012-05-07 MED ORDER — MIDAZOLAM HCL 5 MG/5ML IJ SOLN
INTRAMUSCULAR | Status: AC
  Filled 2012-05-07: qty 10

## 2012-05-07 MED ORDER — HYDROCORTISONE ACETATE 25 MG RE SUPP: 25.0000 mg | Freq: Every day | RECTAL | Status: DC

## 2012-05-07 MED ORDER — STERILE WATER FOR IRRIGATION IR SOLN
Status: DC | PRN
  Administered 2012-05-07: 10:00:00

## 2012-05-07 MED ORDER — MEPERIDINE HCL 50 MG/ML IJ SOLN
INTRAMUSCULAR | Status: DC | PRN
  Administered 2012-05-07 (×2): 25 mg via INTRAVENOUS

## 2012-05-07 MED ORDER — PSYLLIUM 28 % PO PACK: 1.0000 | PACK | Freq: Every day | ORAL | Status: DC

## 2012-05-07 MED ORDER — MEPERIDINE HCL 50 MG/ML IJ SOLN
INTRAMUSCULAR | Status: AC
  Filled 2012-05-07: qty 1

## 2012-05-07 MED ORDER — MIDAZOLAM HCL 5 MG/5ML IJ SOLN
INTRAMUSCULAR | Status: DC | PRN
  Administered 2012-05-07: 2 mg via INTRAVENOUS
  Administered 2012-05-07 (×3): 1 mg via INTRAVENOUS

## 2012-05-07 NOTE — Op Note (Signed)
COLONOSCOPY PROCEDURE REPORT  PATIENT:  Sandra [name]  MR#:  [id number] Birthdate:  May 15, 1940, 72 y.o., female Endoscopist:  Dr. Malissa Hippo, MD Referred By:  Dr. Ignatius Specking, MD Procedure Date: 05/07/2012  Procedure:   Colonoscopy with snare polypectomy and Hemoclip application.  Indications:  Patient is 72 year old Caucasian female with history of colonic polyps who presents with recurrent hematochezia which is usually small volume except single episode with moderate hematochezia.  Informed Consent:  The procedure and risks were reviewed with the patient and informed consent was obtained.  Medications:  Demerol 50 mg IV Versed 5 mg IV  Description of procedure:  After a digital rectal exam was performed, that colonoscope was advanced from the anus through the rectum and colon to the area of the cecum, ileocecal valve and appendiceal orifice. The cecum was deeply intubated. These structures were well-seen and photographed for the record. From the level of the cecum and ileocecal valve, the scope was slowly and cautiously withdrawn. The mucosal surfaces were carefully surveyed utilizing scope tip to flexion to facilitate fold flattening as needed. The scope was pulled down into the rectum where a thorough exam including retroflexion was performed.  Findings:   Prep satisfactory. 15 mm polyp snared from cecum. 2 hemoclips applied to polypectomy site to prevent risk of bleeding(patient has to go back on Xarelto ASAP). This polyp was removed using twister device. 5 mm polyp cold snared from ascending colon. Normal rectal mucosa. Dominant anal papillae and hemorrhoids below the dentate line.  Therapeutic/Diagnostic Maneuvers Performed:  See above  Complications:  None  Cecal Withdrawal Time:  14 minutes  Impression:  Examination performed to cecum. 15 mm polyp snared from cecum and 2 hemoclips applied to polypectomy site to prevent risk of bleeding in a patient who needs to be  back on Xarelto. 5 mm polyp cold snared from ascending colon. External hemorrhoids with prominent anal papillae. Recurrent hematochezia felt to be secondary to hemorrhoids.  Recommendations:  Standard instructions given. A she will resume usual medications including Xarelto this evening. Fiber supplement 3-4 g by mouth daily. Anusol-HC suppository 1 per rectum daily at bedtime for two weeks. I will contact patient with biopsy results and further recommendations.  Rober Skeels U  05/07/2012 11:00 AM  CC: Dr. Ignatius Specking., MD & Dr. Bonnetta Barry ref. provider found

## 2012-05-07 NOTE — H&P (Signed)
Sandra [name] is an 72 y.o. female.   Chief Complaint: Patient is here for colonoscopy. HPI: Patient is 72 year old Caucasian female with history of colonic polyps who now presents with recurrent hematochezia. Usually she notices small amount of fresh blood on tissue except once when blood was dripping out. She also complains of intermittent diarrhea and urgency and single accident. She has not been constipated lately. Her last colonoscopy was in 2005. She has good appetite and has not lost any weight. Family history is negative for colorectal carcinoma.  Past Medical History  Diagnosis Date  . Diabetes mellitus   . Hypertension   . Arthritis   . Pulmonary embolism 04/2011  . DVT (deep venous thrombosis), left 04/2011    after an ankle fracture.   . Mitral regurgitation 07/2011    moderate    Past Surgical History  Procedure Date  . Abdominal hysterectomy   . Foot surgery   . Cataract extraction   . Hernia repair   . Tonsillectomy     History reviewed. No pertinent family history. Social History:  reports that she has never smoked. She does not have any smokeless tobacco history on file. She reports that she does not drink alcohol or use illicit drugs.  Allergies:  Allergies  Allergen Reactions  . Buprenex (Buprenorphine) Nausea And Vomiting    Medications Prior to Admission  Medication Sig Dispense Refill  . amLODipine (NORVASC) 5 MG tablet Take 5 mg by mouth daily.       . Calcium Carbonate-Vit D-Min (CALCIUM 1200 PO) Take 1 capsule by mouth daily.       Marland Kitchen FLUoxetine (PROZAC) 20 MG capsule Take 20 mg by mouth daily.        Marland Kitchen glimepiride (AMARYL) 4 MG tablet Take 4 mg by mouth 2 (two) times daily.        . insulin glargine (LANTUS) 100 UNIT/ML injection Inject 65 Units into the skin daily.       . metFORMIN (GLUCOPHAGE) 500 MG tablet Take 500 mg by mouth 2 (two) times daily with a meal.        . metoprolol succinate (TOPROL-XL) 50 MG 24 hr tablet Take 1 tablet (50 mg  total) by mouth daily.  30 tablet  6  . NOVOLOG FLEXPEN 100 UNIT/ML injection Inject 8 Units into the skin 3 (three) times daily before meals.       . pantoprazole (PROTONIX) 40 MG tablet Take 1 tablet (40 mg total) by mouth daily.  30 tablet  6  . peg 3350 powder (MOVIPREP) 100 G SOLR Take 1 kit (100 g total) by mouth once.  1 kit  0  . Rivaroxaban (XARELTO) 20 MG TABS Take 20 mg by mouth daily.      . traMADol (ULTRAM) 50 MG tablet Take 50 mg by mouth 2 (two) times daily as needed. For pain         Results for orders placed during the hospital encounter of 05/07/12 (from the past 48 hour(s))  GLUCOSE, CAPILLARY     Status: Abnormal   Collection Time   05/07/12  9:32 AM      Component Value Range Comment   Glucose-Capillary 149 (*) 70 - 99 mg/dL    No results found.  ROS  Blood pressure 164/57, pulse 70, temperature 98.2 F (36.8 C), temperature source Oral, resp. rate 20, height 5\' 2"  (1.575 m), weight 168 lb (76.204 kg), SpO2 93.00%. Physical Exam  Constitutional: She appears well-developed and well-nourished.  HENT:  Mouth/Throat: Oropharynx is clear and moist.  Eyes: Conjunctivae normal are normal. No scleral icterus.  Neck: No thyromegaly present.  Cardiovascular: Normal rate, regular rhythm and normal heart sounds.   No murmur heard. Respiratory: Effort normal.  GI: Soft. She exhibits no distension and no mass. There is tenderness (mild tenderness at LLQ and epigastric.).  Musculoskeletal: She exhibits no edema.  Lymphadenopathy:    She has no cervical adenopathy.  Neurological: She is alert.  Skin: Skin is warm and dry.     Assessment/Plan History of colonic polyps. Hematochezia. Diagnostic/surveillance colonoscopy  Sandra [name] U 05/07/2012, 10:09 AM

## 2012-05-07 NOTE — OR Nursing (Signed)
Device not connected, per RN , during procedure vital signs remained WNL throughout the procedure.

## 2012-05-11 ENCOUNTER — Encounter (INDEPENDENT_AMBULATORY_CARE_PROVIDER_SITE_OTHER): Payer: Self-pay | Admitting: *Deleted

## 2012-05-11 ENCOUNTER — Encounter (HOSPITAL_COMMUNITY): Payer: Self-pay | Admitting: Internal Medicine

## 2012-05-11 NOTE — Progress Notes (Signed)
Apt has been scheduled for 08/04/12 with Dr. Karilyn Cota.

## 2012-08-04 ENCOUNTER — Encounter (INDEPENDENT_AMBULATORY_CARE_PROVIDER_SITE_OTHER): Payer: Self-pay | Admitting: Internal Medicine

## 2012-08-04 ENCOUNTER — Ambulatory Visit (INDEPENDENT_AMBULATORY_CARE_PROVIDER_SITE_OTHER): Payer: Medicare Other | Admitting: Internal Medicine

## 2012-08-04 VITALS — BP 140/70 | HR 72 | Temp 98.3°F | Resp 18 | Ht 63.0 in | Wt 161.0 lb

## 2012-08-04 DIAGNOSIS — K921 Melena: Secondary | ICD-10-CM

## 2012-08-04 DIAGNOSIS — Z860100 Personal history of colon polyps, unspecified: Secondary | ICD-10-CM | POA: Insufficient documentation

## 2012-08-04 NOTE — Patient Instructions (Signed)
Continue high fiber diet. Notify if rectal bleeding recurrent. Next colonoscopy in January 2019

## 2012-08-04 NOTE — Progress Notes (Signed)
Presenting complaint;  Followup for hematochezia.  Subjective:  Patient is 72 year old Caucasian female who has history of colonic adenomas and intermittent hematochezia. She underwent colonoscopy on 05/07/2012 with removal of 2 polyps and these are tubular adenomas. She did not have any problems post polypectomy. She has had very few episodes of hematochezia since her colonoscopy. When this occurs she notices small amount of blood with the bowel movements. This only occurs when she is constipated. She is trying to consume foods that help prevent constipation. He denies abdominal pain. She has a good appetite.  Current Medications: Current Outpatient Prescriptions  Medication Sig Dispense Refill  . amLODipine (NORVASC) 5 MG tablet Take 5 mg by mouth daily.       . Calcium Carbonate-Vit D-Min (CALCIUM 1200 PO) Take 1 capsule by mouth daily.       . Cholecalciferol (VITAMIN D-3) 1000 UNITS CAPS Take 1,000 Units by mouth daily.      Marland Kitchen FLUoxetine (PROZAC) 20 MG capsule Take 20 mg by mouth daily.        Marland Kitchen glimepiride (AMARYL) 4 MG tablet Take 4 mg by mouth 2 (two) times daily.        . insulin glargine (LANTUS) 100 UNIT/ML injection Inject 50 Units into the skin daily.       . metFORMIN (GLUCOPHAGE) 500 MG tablet Take 500 mg by mouth. Patient takes 2 in the morning and 1 at night      . metoprolol succinate (TOPROL-XL) 50 MG 24 hr tablet Take 1 tablet (50 mg total) by mouth daily.  30 tablet  6  . pantoprazole (PROTONIX) 40 MG tablet Take 1 tablet (40 mg total) by mouth daily.  30 tablet  6  . Rivaroxaban (XARELTO) 20 MG TABS Take 20 mg by mouth daily.      . silver sulfADIAZINE (SILVADENE) 1 % cream Apply 1 application topically 2 (two) times daily.      . traMADol (ULTRAM) 50 MG tablet Take 50 mg by mouth 2 (two) times daily as needed. For pain       . triamcinolone ointment (KENALOG) 0.1 % Apply 1 application topically.      . hydrocortisone (ANUSOL-HC) 25 MG suppository Place 1 suppository (25  mg total) rectally at bedtime.  14 suppository  1   No current facility-administered medications for this visit.     Objective: Blood pressure 140/70, pulse 72, temperature 98.3 F (36.8 C), temperature source Oral, resp. rate 18, height 5\' 3"  (1.6 m), weight 161 lb (73.029 kg). Conjunctiva is pink. Sclera is nonicteric Oropharyngeal mucosa is normal. No neck masses or thyromegaly noted. Abdomen is soft and nontender without organomegaly or masses.  No LE edema or clubbing noted.   Assessment:  #1. Intermittent hematochezia appears to be secondary to hemorrhoids as it only occurs when she is constipated. #2. History of colonic adenomas. She had 2 tubular adenomas removed on her colonoscopy of January 2014. Next colonoscopy would be in January 2019.   Plan:  Continue high fiber diet. Office visit on when necessary basis. Surveillance colonoscopy in January 2019.

## 2012-12-16 ENCOUNTER — Other Ambulatory Visit: Payer: Self-pay | Admitting: Cardiovascular Disease

## 2013-02-05 ENCOUNTER — Encounter: Payer: Self-pay | Admitting: Cardiovascular Disease

## 2013-02-09 ENCOUNTER — Encounter: Payer: Self-pay | Admitting: Cardiovascular Disease

## 2013-02-09 ENCOUNTER — Ambulatory Visit (INDEPENDENT_AMBULATORY_CARE_PROVIDER_SITE_OTHER): Payer: Medicare Other | Admitting: Cardiovascular Disease

## 2013-02-09 VITALS — BP 134/78 | HR 84 | Ht 63.0 in | Wt 162.0 lb

## 2013-02-09 DIAGNOSIS — I059 Rheumatic mitral valve disease, unspecified: Secondary | ICD-10-CM

## 2013-02-09 DIAGNOSIS — I2699 Other pulmonary embolism without acute cor pulmonale: Secondary | ICD-10-CM

## 2013-02-09 DIAGNOSIS — I340 Nonrheumatic mitral (valve) insufficiency: Secondary | ICD-10-CM

## 2013-02-09 DIAGNOSIS — R002 Palpitations: Secondary | ICD-10-CM

## 2013-02-09 DIAGNOSIS — R0789 Other chest pain: Secondary | ICD-10-CM

## 2013-02-09 MED ORDER — NAPROXEN 250 MG PO TABS: 250.0000 mg | ORAL_TABLET | Freq: Two times a day (BID) | ORAL | Status: DC

## 2013-02-09 MED ORDER — ASPIRIN EC 81 MG PO TBEC: 81.0000 mg | DELAYED_RELEASE_TABLET | Freq: Every day | ORAL | Status: AC

## 2013-02-09 NOTE — Assessment & Plan Note (Signed)
The patient again has a question of duration of long-term anticoagulation as needed for pulmonary embolism. She is having hard time affording the medication. She had pulmonary embolism related to left lower extremity DVT after she had prolonged immobilization due to left ankle fracture. Thus, that episode of DVT/PE was provoked. Treatment for 6 months is recommended. She has been on treatment for more than a year and thus I think it is reasonable to stop anticoagulation which was done today. I asked him to stop aspirin 81 mg once daily.

## 2013-02-09 NOTE — Patient Instructions (Signed)
Your physician has recommended you make the following change in your medication: STOP Xarelto, START Aspirin 81mg  take one by mouth daily, START Naproxen 250mg  take one by mouth twice a day for 5 days  Your physician wants you to follow-up in: 6 MONTHS with Dr Kirke Corin.  You will receive a reminder letter in the mail two months in advance. If you don't receive a letter, please call our office to schedule the follow-up appointment.  Please call the office in 1 WEEK if your symptoms have not improved.

## 2013-02-09 NOTE — Assessment & Plan Note (Signed)
Current chest pain is atypical and seems to be pleuritic in nature. She also has another discomfort which seems to be musculoskeletal. Current EKG does not show any acute changes. Stress test last year was unremarkable. I recommend treatment with a short-term course of NSAIDs. I asked her to notify us if symptoms do not improve within one week.

## 2013-02-09 NOTE — Assessment & Plan Note (Signed)
Symptoms are controlled with metoprolol. She does have an anxiety component.

## 2013-02-09 NOTE — Assessment & Plan Note (Signed)
Moderate mitral regurgitation on echo in 2013. Continue to monitor. She does not have a murmur.

## 2013-02-09 NOTE — Progress Notes (Signed)
HPI  This is a 72 year old female who is here today for a follow up visit regarding chest pain and palpitations.  She was seen in 2013 for palpitations and atypical chest pain. She has no known previous cardiac history but she has reported palpitations for many years without documented arrhythmia. She has chronic medical conditions that include type 2 diabetes and hypertension. In December of 2012 she had a left ankle fracture after a fall. She presented a month later with bilateral pulmonary embolism and left lower extremity DVT. She has been on Xarelto to since then. Her cardiac evaluation in May, 2013 included a nuclear stress test which showed no evidence of ischemia with normal ejection fraction. She was evaluated with a 48-hour Holter monitor which showed short runs of supraventricular tachycardia and frequent PVCs.  She was started on Toprol 25 mg once daily which was subsequently increased to 50 mg once daily with improvement in symptoms.  Chest pain improved with Protonix.  She had recent episodes of chest discomfort described as sharp pain which gets worse with deep breath in lying down on him back on the left side. There is another aching sensation that gets worse with certain movements of the left arm. She had a chest x-ray done also that there was no significant abnormalities. No exertional symptoms. She does feel more tired than usual.     Allergies  Allergen Reactions  . Buprenex [Buprenorphine] Nausea And Vomiting     Current Outpatient Prescriptions on File Prior to Visit  Medication Sig Dispense Refill  . amLODipine (NORVASC) 5 MG tablet Take 5 mg by mouth daily.       . Calcium Carbonate-Vit D-Min (CALCIUM 1200 PO) Take 1 capsule by mouth daily.       . Cholecalciferol (VITAMIN D-3) 1000 UNITS CAPS Take 1,000 Units by mouth daily.      Marland Kitchen FLUoxetine (PROZAC) 20 MG capsule Take 20 mg by mouth daily.        Marland Kitchen glimepiride (AMARYL) 4 MG tablet Take 4 mg by mouth 2 (two) times  daily.        . insulin glargine (LANTUS) 100 UNIT/ML injection Inject 50 Units into the skin daily.       . metFORMIN (GLUCOPHAGE) 500 MG tablet Take 500 mg by mouth. Patient takes 2 in the morning and 1 at night      . metoprolol succinate (TOPROL-XL) 50 MG 24 hr tablet Take 1 tablet (50 mg total) by mouth daily.  30 tablet  6  . pantoprazole (PROTONIX) 40 MG tablet TAKE ONE TABLET DAILY  30 tablet  3  . silver sulfADIAZINE (SILVADENE) 1 % cream Apply 1 application topically 2 (two) times daily.      . traMADol (ULTRAM) 50 MG tablet Take 50 mg by mouth 2 (two) times daily as needed. For pain       . triamcinolone ointment (KENALOG) 0.1 % Apply 1 application topically.       No current facility-administered medications on file prior to visit.     Past Medical History  Diagnosis Date  . Diabetes mellitus   . Hypertension   . Arthritis   . Pulmonary embolism 04/2011  . DVT (deep venous thrombosis), left 04/2011    after an ankle fracture.   . Mitral regurgitation 07/2011    moderate     Past Surgical History  Procedure Laterality Date  . Abdominal hysterectomy    . Foot surgery    . Cataract extraction    .  Hernia repair    . Tonsillectomy    . Colonoscopy  05/07/2012    Procedure: COLONOSCOPY;  Surgeon: Malissa Hippo, MD;  Location: [facility];  Service: Endoscopy;  Laterality: N/A;  245     No family history on file.   History   Social History  . Marital Status: Single    Spouse Name: N/A    Number of Children: N/A  . Years of Education: N/A   Occupational History  . Not on file.   Social History Main Topics  . Smoking status: Never Smoker   . Smokeless tobacco: Never Used  . Alcohol Use: No  . Drug Use: No  . Sexual Activity: Not on file   Other Topics Concern  . Not on file   Social History Narrative  . No narrative on file     PHYSICAL EXAM   BP 134/78  Pulse 84  Ht 5\' 3"  (1.6 m)  Wt 162 lb (73.483 kg)  BMI 28.7  kg/m2  Constitutional: She is oriented to person, place, and time. She appears well-developed and well-nourished. No distress.  HENT: No nasal discharge.  Head: Normocephalic and atraumatic.  Eyes: Pupils are equal and round. Right eye exhibits no discharge. Left eye exhibits no discharge.  Neck: Normal range of motion. Neck supple. No JVD present. No thyromegaly present.  Cardiovascular: Normal rate, regular rhythm, normal heart sounds. Exam reveals no gallop and no friction rub. No murmur heard.  Pulmonary/Chest: Effort normal and breath sounds normal. No stridor. No respiratory distress. She has no wheezes. She has no rales. She exhibits no tenderness.  Abdominal: Soft. Bowel sounds are normal. She exhibits no distension. There is no tenderness. There is no rebound and no guarding.  Musculoskeletal: Normal range of motion. She exhibits no edema and no tenderness.  Neurological: She is alert and oriented to person, place, and time. Coordination normal.  Skin: Skin is warm and dry. No rash noted. She is not diaphoretic. No erythema. No pallor.  Psychiatric: She has a normal mood and affect. Her behavior is normal. Judgment and thought content normal.   EKG: Normal sinus rhythm with nonspecific ST and T wave changes.  ASSESSMENT AND PLAN

## 2013-02-17 ENCOUNTER — Telehealth: Payer: Self-pay | Admitting: Cardiovascular Disease

## 2013-02-17 NOTE — Telephone Encounter (Signed)
New  Problem   Pt calling back as told by Dr Kirke Corin.when pulracy med was done.  It is.  Pt is still hurting but not as bad.  Please call her home phone and leave message on what to do next.  Thanks!

## 2013-02-17 NOTE — Telephone Encounter (Signed)
I spoke with the patient. The pain still seems to be pleuritic but improved overall. i asked her to continue Naproxen for now. If symptoms are not resolved by next week, then will do CT chest.

## 2013-02-17 NOTE — Telephone Encounter (Signed)
Per 02/09/13 office note: The pt was instructed to START Naproxen 250mg  take one by mouth twice a day for 5 days.  The pt is still having discomfort but not as bad and would like to know what is her next step.

## 2013-02-26 ENCOUNTER — Ambulatory Visit (INDEPENDENT_AMBULATORY_CARE_PROVIDER_SITE_OTHER)
Admission: RE | Admit: 2013-02-26 | Discharge: 2013-02-26 | Disposition: A | Discharge status: Home / Self Care | Payer: Medicare Other | Source: Ambulatory Visit | Attending: Cardiovascular Disease | Admitting: Cardiovascular Disease

## 2013-02-26 ENCOUNTER — Telehealth: Payer: Self-pay | Admitting: Cardiovascular Disease

## 2013-02-26 DIAGNOSIS — R079 Chest pain, unspecified: Secondary | ICD-10-CM

## 2013-02-26 MED ORDER — IOHEXOL 350 MG/ML SOLN
80.0000 mL | Freq: Once | INTRAVENOUS | Status: AC | PRN
  Administered 2013-02-26: 80 mL via INTRAVENOUS

## 2013-02-26 NOTE — Telephone Encounter (Signed)
CT negative for PE.  I made the pt aware of these results while in the office.  I instructed the pt to not take Metformin 48 hours after CT. I will forward this message to Dr Kirke Corin to review the CT and determine if any other testing is recommended.

## 2013-02-26 NOTE — Telephone Encounter (Signed)
I spoke with the pt and she is still complaining of chest discomfort.  The pt states her entire chest is hurting and it hurts to breath. The pt is having to change body positions frequently just to try and breath comfortably.  The pt does sound SOB and uncomfortable on the phone. I spoke with Dr Kirke Corin and he would like the pt to have a CT of Chest with contrast for PE protocol. I will obtain the pt's recent labs from [facility]. 02/05/13 BUN 16, Creatinine 0.66.  The pt is on her way to our office now for CT.  The pt will need instructions in regards to Metformin.

## 2013-02-26 NOTE — Telephone Encounter (Signed)
New Problem  Pt states that she was instructed to take medication naproxen for chest pains/// Still having chest pains// please call back

## 2013-02-28 NOTE — Telephone Encounter (Signed)
If she continues to have chest pain, then it might best to proceed with cardiac cath.

## 2013-03-03 NOTE — Telephone Encounter (Signed)
I spoke with the pt and she states that her CP has not improved. The pt states that she is in constant pain.  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  The PCP stopped the pt's Naproxen and started her on diclofenac 75mg  bid.  The pt took her first dose last night and states she has felt drunk all day.  The pt is going to take another dose tonight to see if she can tolerate this medication. I will forward this message to Dr Kirke Corin to let him know of the changes made by the PCP.

## 2013-03-03 NOTE — Telephone Encounter (Signed)
Agree that the chest pain does not seem to be cardiac. Continue to follow up with PCP and forward CT results to PCP.

## 2013-03-05 NOTE — Telephone Encounter (Signed)
CTA results sent through [facility].

## 2013-08-24 ENCOUNTER — Ambulatory Visit (INDEPENDENT_AMBULATORY_CARE_PROVIDER_SITE_OTHER): Payer: Medicare Other | Admitting: Cardiovascular Disease

## 2013-08-24 ENCOUNTER — Encounter: Payer: Self-pay | Admitting: Cardiovascular Disease

## 2013-08-24 VITALS — BP 158/62 | HR 74 | Ht 63.0 in | Wt 161.0 lb

## 2013-08-24 DIAGNOSIS — R002 Palpitations: Secondary | ICD-10-CM

## 2013-08-24 DIAGNOSIS — I059 Rheumatic mitral valve disease, unspecified: Secondary | ICD-10-CM

## 2013-08-24 DIAGNOSIS — I340 Nonrheumatic mitral (valve) insufficiency: Secondary | ICD-10-CM

## 2013-08-24 DIAGNOSIS — R0789 Other chest pain: Secondary | ICD-10-CM

## 2013-08-24 DIAGNOSIS — I2699 Other pulmonary embolism without acute cor pulmonale: Secondary | ICD-10-CM

## 2013-08-24 NOTE — Assessment & Plan Note (Signed)
Symptoms are well controlled on current dose of Toprol.

## 2013-08-24 NOTE — Progress Notes (Signed)
HPI  This is a 73 year old female who is here today for a follow up visit regarding palpitations due to PVCs and short runs of SVT.  She was seen in 2013 for palpitations and atypical chest pain.  She has chronic medical conditions that include type 2 diabetes and hypertension. In December of 2012 she had a left ankle fracture after a fall. She presented a month later with bilateral pulmonary embolism and left lower extremity DVT. She was treated with Xarelto .  Her cardiac evaluation in May, 2013 included a nuclear stress test which showed no evidence of ischemia with normal ejection fraction. She was evaluated with a 48-hour Holter monitor which showed short runs of supraventricular tachycardia and frequent PVCs.  She was started on Toprol 25 mg once daily which was subsequently increased to 50 mg once daily with improvement in symptoms.   She was seen last year for pleuritic chest pain with negative workup. It was thought to be due to pleurisy. Symptoms subsequently resolved. She has been doing well overall and denies recurrent chest pain and no significant dyspnea.     Allergies  Allergen Reactions  . Buprenex [Buprenorphine] Nausea And Vomiting     Current Outpatient Prescriptions on File Prior to Visit  Medication Sig Dispense Refill  . amLODipine (NORVASC) 5 MG tablet Take 5 mg by mouth daily.       Marland Kitchen aspirin EC 81 MG tablet Take 1 tablet (81 mg total) by mouth daily.  1 tablet  0  . Calcium Carbonate-Vit D-Min (CALCIUM 1200 PO) Take 1 capsule by mouth daily.       . Cholecalciferol (VITAMIN D-3) 1000 UNITS CAPS Take 1,000 Units by mouth daily.      Marland Kitchen FLUoxetine (PROZAC) 20 MG capsule Take 20 mg by mouth daily.        Marland Kitchen glimepiride (AMARYL) 4 MG tablet Take 4 mg by mouth 2 (two) times daily.        Marland Kitchen HYDROcodone-acetaminophen (NORCO/VICODIN) 5-325 MG per tablet Take 1 tablet by mouth every 6 (six) hours as needed for pain.      Marland Kitchen insulin glargine (LANTUS) 100 UNIT/ML injection  Inject 50 Units into the skin daily.       . metFORMIN (GLUCOPHAGE) 500 MG tablet Take 500 mg by mouth. Patient takes 2 in the morning and 1 at night      . metoprolol succinate (TOPROL-XL) 50 MG 24 hr tablet Take 1 tablet (50 mg total) by mouth daily.  30 tablet  6  . naproxen (NAPROSYN) 250 MG tablet Take 1 tablet (250 mg total) by mouth 2 (two) times daily with a meal.  10 tablet  0  . pantoprazole (PROTONIX) 40 MG tablet TAKE ONE TABLET DAILY  30 tablet  3  . silver sulfADIAZINE (SILVADENE) 1 % cream Apply 1 application topically 2 (two) times daily.      . traMADol (ULTRAM) 50 MG tablet Take 50 mg by mouth 2 (two) times daily as needed. For pain       . triamcinolone ointment (KENALOG) 0.1 % Apply 1 application topically.       No current facility-administered medications on file prior to visit.     Past Medical History  Diagnosis Date  . Diabetes mellitus   . Hypertension   . Arthritis   . Pulmonary embolism 04/2011  . DVT (deep venous thrombosis), left 04/2011    after an ankle fracture.   . Mitral regurgitation 07/2011  moderate     Past Surgical History  Procedure Laterality Date  . Abdominal hysterectomy    . Foot surgery    . Cataract extraction    . Hernia repair    . Tonsillectomy    . Colonoscopy  05/07/2012    Procedure: COLONOSCOPY;  Surgeon: Rogene Houston, MD;  Location: [facility];  Service: Endoscopy;  Laterality: N/A;  245     No family history on file.   History   Social History  . Marital Status: Single    Spouse Name: N/A    Number of Children: N/A  . Years of Education: N/A   Occupational History  . Not on file.   Social History Main Topics  . Smoking status: Never Smoker   . Smokeless tobacco: Never Used  . Alcohol Use: No  . Drug Use: No  . Sexual Activity: Not on file   Other Topics Concern  . Not on file   Social History Narrative  . No narrative on file     PHYSICAL EXAM   BP 158/62  Ht 5\' 3"  (1.6 m)  Wt 161 lb  (73.029 kg)  BMI 28.53 kg/m2  Constitutional: She is oriented to person, place, and time. She appears well-developed and well-nourished. No distress.  HENT: No nasal discharge.  Head: Normocephalic and atraumatic.  Eyes: Pupils are equal and round. Right eye exhibits no discharge. Left eye exhibits no discharge.  Neck: Normal range of motion. Neck supple. No JVD present. No thyromegaly present.  Cardiovascular: Normal rate, regular rhythm, normal heart sounds. Exam reveals no gallop and no friction rub. No murmur heard.  Pulmonary/Chest: Effort normal and breath sounds normal. No stridor. No respiratory distress. She has no wheezes. She has no rales. She exhibits no tenderness.  Abdominal: Soft. Bowel sounds are normal. She exhibits no distension. There is no tenderness. There is no rebound and no guarding.  Musculoskeletal: Normal range of motion. She exhibits no edema and no tenderness.  Neurological: She is alert and oriented to person, place, and time. Coordination normal.  Skin: Skin is warm and dry. No rash noted. She is not diaphoretic. No erythema. No pallor.  Psychiatric: She has a normal mood and affect. Her behavior is normal. Judgment and thought content normal.   EKG: Normal sinus rhythm with nonspecific ST and T wave changes.  ASSESSMENT AND PLAN

## 2013-08-24 NOTE — Assessment & Plan Note (Signed)
Cardiac workup has been negative. Most recent chest pain last year was due to pleurisy and has resolved.

## 2013-08-24 NOTE — Assessment & Plan Note (Signed)
Moderate mitral regurgitation on echo in 2013. Continue to monitor. She does not have a murmur. She has no symptoms related to this.

## 2013-08-24 NOTE — Assessment & Plan Note (Signed)
She finished treatment with Xarelto.

## 2013-08-24 NOTE — Patient Instructions (Signed)
Your physician wants you to follow-up in: 1 YEAR with Dr Arida.  You will receive a reminder letter in the mail two months in advance. If you don't receive a letter, please call our office to schedule the follow-up appointment.  Your physician recommends that you continue on your current medications as directed. Please refer to the Current Medication list given to you today.  

## 2014-05-11 ENCOUNTER — Ambulatory Visit (INDEPENDENT_AMBULATORY_CARE_PROVIDER_SITE_OTHER): Payer: Medicare Other | Admitting: Internal Medicine

## 2014-05-11 ENCOUNTER — Encounter: Payer: Self-pay | Admitting: Internal Medicine

## 2014-05-11 VITALS — BP 114/66 | HR 71 | Temp 98.1°F | Resp 12 | Ht 63.0 in | Wt 163.6 lb

## 2014-05-11 DIAGNOSIS — E118 Type 2 diabetes mellitus with unspecified complications: Secondary | ICD-10-CM

## 2014-05-11 DIAGNOSIS — E1165 Type 2 diabetes mellitus with hyperglycemia: Secondary | ICD-10-CM

## 2014-05-11 DIAGNOSIS — IMO0002 Reserved for concepts with insufficient information to code with codable children: Secondary | ICD-10-CM

## 2014-05-11 MED ORDER — INSULIN LISPRO 100 UNIT/ML (KWIKPEN): 8.0000 [IU] | PEN_INJECTOR | Freq: Three times a day (TID) | SUBCUTANEOUS | Status: DC

## 2014-05-11 NOTE — Patient Instructions (Signed)
Please decrease Toujeo to 35 units at bedtime. Stop Glimepiride. Start Humalog: - 6 units with a smaller meal - 8 units with a larger meal  Please call me in 2 weeks with your sugars.  Please return in 1 month with your sugar log.   Please let me know if the sugars are consistently <80 or >200.  PATIENT INSTRUCTIONS FOR TYPE 2 DIABETES:  **Please join MyChart!** - see attached instructions about how to join if you have not done so already.  DIET AND EXERCISE Diet and exercise is an important part of diabetic treatment.  We recommended aerobic exercise in the form of brisk walking (working between 40-60% of maximal aerobic capacity, similar to brisk walking) for 150 minutes per week (such as 30 minutes five days per week) along with 3 times per week performing 'resistance' training (using various gauge rubber tubes with handles) 5-10 exercises involving the major muscle groups (upper body, lower body and core) performing 10-15 repetitions (or near fatigue) each exercise. Start at half the above goal but build slowly to reach the above goals. If limited by weight, joint pain, or disability, we recommend daily walking in a swimming pool with water up to waist to reduce pressure from joints while allow for adequate exercise.    BLOOD GLUCOSES Monitoring your blood glucoses is important for continued management of your diabetes. Please check your blood glucoses 2-4 times a day: fasting, before meals and at bedtime (you can rotate these measurements - e.g. one day check before the 3 meals, the next day check before 2 of the meals and before bedtime, etc.).   HYPOGLYCEMIA (low blood sugar) Hypoglycemia is usually a reaction to not eating, exercising, or taking too much insulin/ other diabetes drugs.  Symptoms include tremors, sweating, hunger, confusion, headache, etc. Treat IMMEDIATELY with 15 grams of Carbs: . 4 glucose tablets .  cup regular juice/soda . 2 tablespoons raisins . 4 teaspoons  sugar . 1 tablespoon honey Recheck blood glucose in 15 mins and repeat above if still symptomatic/blood glucose <100.  RECOMMENDATIONS TO REDUCE YOUR RISK OF DIABETIC COMPLICATIONS: * Take your prescribed MEDICATION(S) * Follow a DIABETIC diet: Complex carbs, fiber rich foods, (monounsaturated and polyunsaturated) fats * AVOID saturated/trans fats, high fat foods, >2,300 mg salt per day. * EXERCISE at least 5 times a week for 30 minutes or preferably daily.  * DO NOT SMOKE OR DRINK more than 1 drink a day. * Check your FEET every day. Do not wear tightfitting shoes. Contact us if you develop an ulcer * See your EYE doctor once a year or more if needed * Get a FLU shot once a year * Get a PNEUMONIA vaccine once before and once after age 40 years  GOALS:  * Your Hemoglobin A1c of <7%  * fasting sugars need to be <130 * after meals sugars need to be <180 (2h after you start eating) * Your Systolic BP should be 540 or lower  * Your Diastolic BP should be 80 or lower  * Your HDL (Good Cholesterol) should be 40 or higher  * Your LDL (Bad Cholesterol) should be 100 or lower. * Your Triglycerides should be 150 or lower  * Your Urine microalbumin (kidney function) should be <30 * Your Body Mass Index should be 25 or lower    Please consider the following ways to cut down carbs and fat and increase fiber and micronutrients in your diet: - substitute whole grain for white bread or pasta -  substitute brown rice for white rice - substitute 90-calorie flat bread pieces for slices of bread when possible - substitute sweet potatoes or yams for white potatoes - substitute humus for margarine - substitute tofu for cheese when possible - substitute almond or rice milk for regular milk (would not drink soy milk daily due to concern for soy estrogen influence on breast cancer risk) - substitute dark chocolate for other sweets when possible - substitute water - can add lemon or orange slices for taste  - for diet sodas (artificial sweeteners will trick your body that you can eat sweets without getting calories and will lead you to overeating and weight gain in the long run) - do not skip breakfast or other meals (this will slow down the metabolism and will result in more weight gain over time)  - can try smoothies made from fruit and almond/rice milk in am instead of regular breakfast - can also try old-fashioned (not instant) oatmeal made with almond/rice milk in am - order the dressing on the side when eating salad at a restaurant (pour less than half of the dressing on the salad) - eat as little meat as possible - can try juicing, but should not forget that juicing will get rid of the fiber, so would alternate with eating raw veg./fruits or drinking smoothies - use as little oil as possible, even when using olive oil - can dress a salad with a mix of balsamic vinegar and lemon juice, for e.g. - use agave nectar, stevia sugar, or regular sugar rather than artificial sweateners - steam or broil/roast veggies  - snack on veggies/fruit/nuts (unsalted, preferably) when possible, rather than processed foods - reduce or eliminate aspartame in diet (it is in diet sodas, chewing gum, etc) Read the labels!  Try to read Dr. Janene Harvey book: "Program for Reversing Diabetes" for other ideas for healthy eating.

## 2014-05-11 NOTE — Progress Notes (Signed)
Patient ID: Sandra [name], female   DOB: January 12, 1941, 74 y.o.   MRN: [id number]  HPI: Sandra [name] is a 74 y.o.-year-old female, referred by her PCP, Dr. Woody Seller, for management of DM2, dx in 2006, insulin-dependent since ~ 8 years ago, uncontrolled, with complications (PN, cerebro-vascular ds - h/o TIA). She is here with her daughter and her son, who are also  both diabetics.  Last hemoglobin A1c was: 04/13/2014: HbA1c 9.4% Lab Results  Component Value Date   HGBA1C 7.7* 04/26/2011   Pt is on a regimen of: - Metformin 1000 mg in am and 500 mg in pm - Glimepiride 4 mg bid - Toujeo 35 units 2x a day  Pt checks her sugars 2x a day and they are: - am (9 am - 12 pm): 86-150 - 2h after b'fast: n/c - before lunch: n/c - 2h after lunch: n/c - before dinner: n/c - 2h after dinner: 400-600 - bedtime: 323, 344 - nighttime: n/c + lows- mostly in the second part of the night and a.m. Lowest sugar was 60s (am) ; she has hypoglycemia awareness at 90s Highest sugar was HI. She became confused with high sugars before >> did not drive since then.   Glucometer: OneTouch Verio  [name] meals are: - Breakfast: cereal, eggs + bacon, PB cracker - Lunch: sandwich, veggies + meat - Dinner: sandwich, veggies + meat - Snacks: 3-4  - no CKD, last BUN/creatinine:  15/0.75 on 05/07/2013 Lab Results  Component Value Date   BUN 10 04/28/2011   CREATININE 0.49* 04/28/2011   - last set of lipids: No results found for: CHOL, HDL, LDLCALC, LDLDIRECT, TRIG, CHOLHDL - last eye exam was in 10/2013. No DR.  - + numbness; no tingling in her feet.  Pt has FH of DM in mother; children.  ROS: Constitutional: no weight gain/loss, + fatigue, + both subjective hyperthermia/hypothermia, + increased urination and nocturia Eyes: no blurry vision, no xerophthalmia ENT: no sore throat, no nodules palpated in throat, no dysphagia/odynophagia, no hoarseness, + decreased hearing and tinnitus Cardiovascular: no CP/+ SOB/+  palpitations/no leg swelling Respiratory: no cough/+ SOB Gastrointestinal: no N/V/D/C Musculoskeletal: no muscle/joint aches Skin: no rashes, + hair loss Neurological: + tremors/no numbness/tingling/dizziness Psychiatric: + Both: depression/anxiety  Past Medical History  Diagnosis Date  . Diabetes mellitus   . Hypertension   . Arthritis   . Pulmonary embolism 04/2011  . DVT (deep venous thrombosis), left 04/2011    after an ankle fracture.   . Mitral regurgitation 07/2011    moderate   Past Surgical History  Procedure Laterality Date  . Abdominal hysterectomy    . Foot surgery    . Cataract extraction    . Hernia repair    . Tonsillectomy    . Colonoscopy  05/07/2012    Procedure: COLONOSCOPY;  Surgeon: Rogene Houston, MD;  Location: [facility];  Service: Endoscopy;  Laterality: N/A;  245   History   Social History  . Marital Status: Single    Spouse Name: N/A    Number of Children: 2   Occupational History  .  retired    Social History Main Topics  . Smoking status: Never Smoker   . Smokeless tobacco: Never Used  . Alcohol Use: No  . Drug Use: No   Current Outpatient Prescriptions on File Prior to Visit  Medication Sig Dispense Refill  . amLODipine (NORVASC) 5 MG tablet Take 5 mg by mouth daily.     . Calcium Carbonate-Vit  D-Min (CALCIUM 1200 PO) Take 1 capsule by mouth daily.     . Cholecalciferol (VITAMIN D-3) 1000 UNITS CAPS Take 1,000 Units by mouth daily.    Marland Kitchen FLUoxetine (PROZAC) 20 MG capsule Take 20 mg by mouth daily.      Marland Kitchen glimepiride (AMARYL) 4 MG tablet Take 4 mg by mouth 2 (two) times daily.      . metFORMIN (GLUCOPHAGE) 500 MG tablet Take 500 mg by mouth. Patient takes 2 in the morning and 1 at night    . pantoprazole (PROTONIX) 40 MG tablet TAKE ONE TABLET DAILY 30 tablet 3  . silver sulfADIAZINE (SILVADENE) 1 % cream Apply 1 application topically 2 (two) times daily.    . traMADol (ULTRAM) 50 MG tablet Take 50 mg by mouth 2 (two) times daily  as needed. For pain     . triamcinolone ointment (KENALOG) 0.1 % Apply 1 application topically.    . Estrogens, Conjugated (PREMARIN VA) Place vaginally Nightly.    . insulin glargine (LANTUS) 100 UNIT/ML injection Inject 60 Units into the skin daily.     . metoprolol succinate (TOPROL-XL) 50 MG 24 hr tablet Take 1 tablet (50 mg total) by mouth daily. 30 tablet 6  . PROAIR HFA 108 (90 BASE) MCG/ACT inhaler      No current facility-administered medications on file prior to visit.   Allergies  Allergen Reactions  . Buprenex [Buprenorphine] Nausea And Vomiting   No family history on file.  PE: BP 114/66 mmHg  Pulse 71  Temp(Src) 98.1 F (36.7 C) (Oral)  Resp 12  Ht 5\' 3"  (1.6 m)  Wt 163 lb 9.6 oz (74.208 kg)  BMI 28.99 kg/m2  SpO2 97% Wt Readings from Last 3 Encounters:  05/11/14 163 lb 9.6 oz (74.208 kg)  08/24/13 161 lb (73.029 kg)  02/09/13 162 lb (73.483 kg)   Constitutional: Slightly overweight, in NAD Eyes: PERRLA, EOMI, no exophthalmos ENT: moist mucous membranes, no thyromegaly, no cervical lymphadenopathy Cardiovascular: RRR, No MRG Respiratory: CTA B Gastrointestinal: abdomen soft, NT, ND, BS+ Musculoskeletal: + Henderson and Bouchard's nodules in fingers, strength intact in all 4 Skin: moist, warm, no rashes Neurological: Mild tremor with outstretched hands, DTR normal in all 4  ASSESSMENT: 1. DM2, insulin-dependent, uncontrolled, with complications - Peripheral neuropathy - Cerebrovascular disease, history of TIA  PLAN:  1. Patient with long-standing, uncontrolled diabetes, on oral antidiabetic regimen + a large dose of basal insulin, which became insufficient. She has low blood sugars in the morning and has a staircase effect of increasing blood sugars towards the end of the day. She brings her sugar log but she does not have many sugar reads however the ones that she have indicate extremes of CBGs, from 60-600. - We discussed about options for treatment, and  I suggested to use toujeo and add mealtime insulin. She is afraid that she will go into the doughnut hole in March, and at that time, we'll need to switch to NPH and regular insulin most likely. We discussed about this regimen at this time, but she would prefer to wait until she absolutely have to do it, since she will need to use syringes and to mix the insulin herself. For now I advised her to:  Patient Instructions  Please decrease Toujeo to 35 units at bedtime. Stop Glimepiride. Start Humalog: - 6 units with a smaller meal - 8 units with a larger meal  Please call me in 2 weeks with your sugars.  Please return in 1  month with your sugar log.   Please let me know if the sugars are consistently <80 or >200. - I discussed with patient, her daughter and her son about ways to improve her diet, giving her specific examples. I will also refer her to nutrition here in our office. She agrees to schedule this appointment. - Strongly advised her to start checking sugars at different times of the day - check 2-3 times a day, rotating checks - given sugar log and advised how to fill it and to bring it at next appt  - given foot care handout and explained the principles  - given instructions for hypoglycemia management "15-15 rule"  - advised for yearly eye exams - she is up-to-date - Return to clinic in 1 mo with sugar log But I advised her to call me in about 2 weeks to let me know what her sugars are in case we need to change the regimen before she comes back

## 2014-05-12 ENCOUNTER — Telehealth: Payer: Self-pay | Admitting: Internal Medicine

## 2014-05-12 NOTE — Telephone Encounter (Signed)
Pt needs to know about the new insulin dosage change of humalog please call pt

## 2014-05-12 NOTE — Telephone Encounter (Signed)
Called pt and answered her questions and advised her appropriately.

## 2014-05-17 ENCOUNTER — Telehealth: Payer: Self-pay | Admitting: Internal Medicine

## 2014-05-17 ENCOUNTER — Other Ambulatory Visit: Payer: Self-pay | Admitting: *Deleted

## 2014-05-17 MED ORDER — INSULIN GLARGINE 300 UNIT/ML ~~LOC~~ SOPN: 35.0000 [IU] | PEN_INJECTOR | Freq: Every day | SUBCUTANEOUS | Status: DC

## 2014-05-17 NOTE — Telephone Encounter (Signed)
Patient need refill of Toujeo

## 2014-05-17 NOTE — Telephone Encounter (Signed)
Done

## 2014-05-30 ENCOUNTER — Telehealth: Payer: Self-pay | Admitting: Internal Medicine

## 2014-05-30 NOTE — Telephone Encounter (Signed)
Pt needs to speak with [facility] regarding blood sugar, please call back soon

## 2014-05-30 NOTE — Telephone Encounter (Signed)
Returned pt's call concerning her blood sugar readings. Pt gave me her readings for the past 2 weeks. Please advise.  Sandra [name] 1/24    Mon 1/25  Tues 1/26  Thurs 1/28  Fri  1/29 191 (AM before breakfast)  225 (AM)  224 (AM)  107 (AM)  182 (AM) 313 (before dinner)   214 (PM)  366 (at lunch)  386 (PM)  249 (at lunch)         266 (at bedtime)    269 (at bedtime)  Sat 1/30    Sun 1/31  Mon 2/1  Tues 2/2  Wed 2/3 163 (AM)    130 (AM)  133 (AM)  217 (AM)  143 (AM) 166 (PM)    253 (PM)  217 (PM)  227 (PM)   192 (2 hours after dinner)   Thurs 2/4    Fri 2/5   Sat 2/6   Sun 2/7 183 (AM)    146 (AM)  184 (AM)  194 (AM) 248 (at lunch)    302 (PM)  198 (PM)  312 (PM) 206 (PM)

## 2014-05-30 NOTE — Telephone Encounter (Signed)
Plan: Continue Toujeo 35 units at bedtime.  Increase Humalog: - 10 units with a smaller meal - 14 units with a larger meal  Call back with sugarsin 1 week .

## 2014-05-30 NOTE — Telephone Encounter (Signed)
Called pt and advised her per Dr Arman Filter note. Pt understood. Will call us in 1 week.

## 2014-06-09 ENCOUNTER — Telehealth: Payer: Self-pay | Admitting: Internal Medicine

## 2014-06-09 NOTE — Telephone Encounter (Signed)
Pt needs you to call her so she can give some sugar readings to Dr. Cruzita Lederer call @[phone] -[place]

## 2014-06-09 NOTE — Telephone Encounter (Signed)
Spoke with pt. Her blood sugars readings are as follows:  Sun 2/7 Mon 2/8 Tues 2/9 Wed 2/10 Thurs 2/11 Fri 2/12 Sat 2/13 107 am 168 am 122 am 172 am 81 am  63 am  103 am 352 pm 192 pm 189 pm   174 pm 162 pm 145 pm  Pt wants to be able to drive again soon. Please advise.

## 2014-06-09 NOTE — Telephone Encounter (Signed)
Sugars improved after increasing mealtime insulin. Please continue current insulin doses but call us back if sugars consistently <80 or >200. She can start driving (from DM2 pov).

## 2014-06-10 NOTE — Telephone Encounter (Signed)
Noted, patient is aware. 

## 2014-06-20 ENCOUNTER — Encounter: Payer: Medicare Other | Attending: Internal Medicine | Admitting: Dietician

## 2014-06-20 ENCOUNTER — Encounter: Payer: Self-pay | Admitting: Dietician

## 2014-06-20 ENCOUNTER — Ambulatory Visit: Payer: Medicare Other | Admitting: Internal Medicine

## 2014-06-20 VITALS — Ht 63.0 in

## 2014-06-20 DIAGNOSIS — Z713 Dietary counseling and surveillance: Secondary | ICD-10-CM | POA: Diagnosis not present

## 2014-06-20 DIAGNOSIS — E1165 Type 2 diabetes mellitus with hyperglycemia: Secondary | ICD-10-CM

## 2014-06-20 DIAGNOSIS — E118 Type 2 diabetes mellitus with unspecified complications: Secondary | ICD-10-CM | POA: Insufficient documentation

## 2014-06-20 DIAGNOSIS — IMO0002 Reserved for concepts with insufficient information to code with codable children: Secondary | ICD-10-CM

## 2014-06-20 NOTE — Progress Notes (Signed)
  Medical Nutrition Therapy:  Appt start time: 1100 end time:  1200.   Assessment:  Primary concerns today:  Patient is here with her son.  She wants to get her blood sugar under control.  Female partner of 75 years died in 18-May-2023.  Patient reports increased depression.  Often does not want to leave house but symptoms become worse when she is alone all day.  Son lives with patient but is gone in the afternoon and evening.  Patient reports having cooked 6 times since her friend's death.  She has been attending grief recover.    She has been checking her blood sugar 2-3 times per day.  CBG's have been fasting:  80-200, after lunch:  >200, and at bed time:  >260.  HgbA1C was 7.7%  04/25/14.  Preferred Learning Style:   No preference indicated   Learning Readiness:   Not ready  Contemplating  MEDICATIONS: see list.  Includes:  Metformin, Lantus, Toujeo Solostar   DIETARY INTAKE: Patient states that she craves things that are sweet.  24-hr recall:  B ( AM): oatmeal with splenda and milk or sausage biscuit Snk ( AM): graham cracker and peanut butter  L ( PM): fried seafood, baked sweet potato, hush puppies or banana sandwich or SKIPS Snk ( PM): graham crackers and peanut butter or 1/2 of a candy bar D ( PM): hamburger or hot dog or cereal Snk ( PM): peanut butter and cracker Beverages: 2% milk or juice, diet caffeine free, decaf coffee with sweet and low  Usual physical activity: none, wants to start walking when weather improves but she is a high fall risk.  Has taken water aerobics at the [facility] in the past but not for several months.    Estimated energy needs: 1500 calories  170g carbohydrates 112 g protein 42 g fat  Progress Towards Goal(s):  In progress.   Nutritional Diagnosis:  NB-1.1 Food and nutrition-related knowledge deficit As related to balance of carbohydrate, protein, and fat.  As evidenced by diet hx.    Intervention:  Nutrition counseling and diabetes education  initiated. Discussed Carb Counting by food group as method of portion control, reading food labels, and benefits of increased activity.  It is important that you continue the grief counseling group. Consider going to the senior center for lunch. Plan on 1 hot meal every day even if it is a Healthy Choice or similar frozen meal. Consider arm chair exercises or going back to the pool at the [facility] for water aerobics. Eat regularly with protein at every meal and snack.  Please don't skip meals. Aim for 45 grams carbs with each meal. Aim for 0-30 grams of carbs with each snack  Make a vegetable soup this week.  Teaching Method Utilized:  Visual Auditory  Handouts given during visit include:  My plate  Yellow Meal Plan Card  Snack list  Therapeutic Alternatives, card  Barriers to learning/adherence to lifestyle change: depression  Demonstrated degree of understanding via:  Teach Back   Monitoring/Evaluation:  Dietary intake, exercise, and body weight in 2 month(s).

## 2014-06-20 NOTE — Patient Instructions (Signed)
It is important that you continue the grief counseling group. Consider going to the senior center for lunch. Plan on 1 hot meal every day even if it is a Healthy Choice or similar frozen meal. Consider arm chair exercises or going back to the pool at the [facility] for water aerobics. Eat regularly with protein at every meal and snack.  Please don't skip meals. Aim for 45 grams carbs with each meal. Aim for 0-30 grams of carbs with each snack  Make a vegetable soup this week.

## 2014-07-25 ENCOUNTER — Other Ambulatory Visit (HOSPITAL_COMMUNITY): Payer: Self-pay | Admitting: Radiology

## 2014-07-25 DIAGNOSIS — R55 Syncope and collapse: Secondary | ICD-10-CM

## 2014-07-29 ENCOUNTER — Ambulatory Visit (HOSPITAL_COMMUNITY)
Admission: RE | Admit: 2014-07-29 | Discharge: 2014-07-29 | Disposition: A | Discharge status: Home / Self Care | Payer: Medicare Other | Source: Ambulatory Visit | Attending: Neurology | Admitting: Neurology

## 2014-07-29 DIAGNOSIS — R200 Anesthesia of skin: Secondary | ICD-10-CM | POA: Insufficient documentation

## 2014-07-29 DIAGNOSIS — Z79899 Other long term (current) drug therapy: Secondary | ICD-10-CM | POA: Diagnosis not present

## 2014-07-29 DIAGNOSIS — R55 Syncope and collapse: Secondary | ICD-10-CM | POA: Insufficient documentation

## 2014-07-29 DIAGNOSIS — R9401 Abnormal electroencephalogram [EEG]: Secondary | ICD-10-CM | POA: Insufficient documentation

## 2014-07-29 DIAGNOSIS — R269 Unspecified abnormalities of gait and mobility: Secondary | ICD-10-CM | POA: Diagnosis not present

## 2014-07-29 DIAGNOSIS — Z7982 Long term (current) use of aspirin: Secondary | ICD-10-CM | POA: Diagnosis not present

## 2014-07-29 NOTE — Procedures (Signed)
ELECTROENCEPHALOGRAM REPORT  Date of Study: 07/29/2014  Patient's Name: Sandra [name] MRN: [id number] Date of Birth: [birth year]  Referring Provider: Dr. Rip Harbour  Clinical History: This is a 74 year old woman with episode of near syncope, poor balance and unsteady gait, numbness in feet.  Medications: amLODipine (NORVASC) 5 MG tablet Take 5 mg by mouth daily.  aspirin 81 MG tablet Take 81 mg by mouth daily. Calcium Carbonate-Vit D-Min (CALCIUM 1200 PO) Take 1 capsule by mouth daily.  Cholecalciferol (VITAMIN D-3) 1000 UNITS CAPS Take 1,000 Units by mouth daily. FLUoxetine (PROZAC) 20 MG capsule Take 20 mg by mouth daily.  insulin glargine (LANTUS) 100 UNIT/ML injection Inject 35 Units into the skin daily. Insulin Glargine (TOUJEO SOLOSTAR) 300 UNIT/ML SOPN Inject 35 Units into the skin at bedtime. insulin lispro (HUMALOG KWIKPEN) 100 UNIT/ML KiwkPen Inject 0.08-0.1 mLs (8-10 Units total) into the skin 3 (three) times daily. metFORMIN (GLUCOPHAGE) 500 MG tablet Take 500 mg by mouth. Patient takes 2 in the morning and 1 at night metoprolol succinate (TOPROL-XL) 50 MG 24 hr tablet(Expired) Take 1 tablet (50 mg total) by mouth daily. pantoprazole (PROTONIX) 40 MG tablet TAKE ONE TABLET DAILY traMADol (ULTRAM) 50 MG tablet Take 50 mg by mouth 2 (two) times daily as needed. For pain  triamcinolone ointment (KENALOG) 0.1 %   Technical Summary: A multichannel digital EEG recording measured by the international 10-20 system with electrodes applied with paste and impedances below 5000 ohms performed in our laboratory with EKG monitoring in an awake and asleep patient.  Hyperventilation was not performed. Photic stimulation was performed.  The digital EEG was referentially recorded, reformatted, and digitally filtered in a variety of bipolar and referential montages for optimal display.    Description: The patient is awake and asleep during the recording.  During maximal wakefulness, there  is a symmetric, medium voltage 10 Hz posterior dominant rhythm that attenuates with eye opening.  During drowsiness, there is occasional focal 2-3 Hz delta slowing seen independently over the bilateral temporal regions, left greater than right. Occasional vertex waves are seen. Photic stimulation did not elicit any abnormalities.  There were no epileptiform discharges or electrographic seizures seen.    EKG lead was unremarkable.  Impression: This awake and asleep EEG is abnormal due to occasional focal slowing seen during drowsiness over the bilateral temporal regions, left greater than right.  Clinical Correlation of the above findings indicates focal cerebral dysfunction over the bilateral temporal regions suggestive of underlying structural or physiologic abnormality. The absence of epileptiform discharges does not exclude a clinical diagnosis of epilepsy. Clinical correlation is advised.   Ellouise Newer, M.D.

## 2014-08-01 ENCOUNTER — Inpatient Hospital Stay (HOSPITAL_COMMUNITY): Admission: RE | Admit: 2014-08-01 | Payer: Medicare Other | Source: Ambulatory Visit

## 2014-08-02 ENCOUNTER — Ambulatory Visit (HOSPITAL_COMMUNITY)
Admission: RE | Admit: 2014-08-02 | Discharge: 2014-08-02 | Disposition: A | Discharge status: Home / Self Care | Payer: Medicare Other | Source: Ambulatory Visit | Attending: Internal Medicine | Admitting: Internal Medicine

## 2014-08-02 DIAGNOSIS — R55 Syncope and collapse: Secondary | ICD-10-CM | POA: Insufficient documentation

## 2014-08-02 NOTE — Progress Notes (Signed)
48 hour Holter Monitor in progress. 

## 2014-08-08 ENCOUNTER — Other Ambulatory Visit: Payer: Self-pay | Admitting: *Deleted

## 2014-08-08 DIAGNOSIS — R55 Syncope and collapse: Secondary | ICD-10-CM

## 2014-08-09 ENCOUNTER — Ambulatory Visit (INDEPENDENT_AMBULATORY_CARE_PROVIDER_SITE_OTHER): Payer: Medicare Other | Admitting: Cardiovascular Disease

## 2014-08-09 ENCOUNTER — Encounter: Payer: Self-pay | Admitting: Cardiovascular Disease

## 2014-08-09 VITALS — BP 136/66 | HR 73 | Ht 63.0 in | Wt 163.1 lb

## 2014-08-09 DIAGNOSIS — R55 Syncope and collapse: Secondary | ICD-10-CM | POA: Diagnosis not present

## 2014-08-09 DIAGNOSIS — R0602 Shortness of breath: Secondary | ICD-10-CM | POA: Diagnosis not present

## 2014-08-09 DIAGNOSIS — R002 Palpitations: Secondary | ICD-10-CM | POA: Diagnosis not present

## 2014-08-09 NOTE — Progress Notes (Signed)
HPI  This is a 74 year old female who is here today for evaluation of syncope. She has been seen by me for palpitations due to PVCs and short runs of SVT.  She was seen in 2013 for palpitations and atypical chest pain.  She has chronic medical conditions that include type 2 diabetes and hypertension. In December of 2012 she had a left ankle fracture after a fall. She presented a month later with bilateral pulmonary embolism and left lower extremity DVT. She was treated with Xarelto .  Her cardiac evaluation in May, 2013 included a nuclear stress test which showed no evidence of ischemia with normal ejection fraction. She was evaluated with a 48-hour Holter monitor which showed short runs of supraventricular tachycardia and frequent PVCs.  She was started on Toprol 25 mg once daily which was subsequently increased to 50 mg once daily with improvement in symptoms.   She reports significant anxiety and depression after she lost her husband in November of last year. She reports 2 syncopal episodes in the last 6 months. The first was while she was doing some yard work. She really does not remember the details but reports loss of consciousness and no memory of a 12 hour span. The second episode was similar. It was thought that she might had hypoglycemia but reports that blood sugar was fine. She has been having balance problems and hearing loss in the left ear. She reports being evaluated by both ENT and neurology with no definitive diagnosis. She denies any palpitations. No chest pain. Shortness of breath is unchanged from before.     Allergies  Allergen Reactions  . Buprenex [Buprenorphine] Nausea And Vomiting     Current Outpatient Prescriptions on File Prior to Visit  Medication Sig Dispense Refill  . amLODipine (NORVASC) 5 MG tablet Take 5 mg by mouth daily.     Marland Kitchen aspirin 81 MG tablet Take 81 mg by mouth daily.    . Calcium Carbonate-Vit D-Min (CALCIUM 1200 PO) Take 1 capsule by mouth daily.      . Cholecalciferol (VITAMIN D-3) 1000 UNITS CAPS Take 1,000 Units by mouth daily.    Marland Kitchen FLUoxetine (PROZAC) 20 MG capsule Take 20 mg by mouth daily.      . insulin glargine (LANTUS) 100 UNIT/ML injection Inject 35 Units into the skin daily.    . Insulin Glargine (TOUJEO SOLOSTAR) 300 UNIT/ML SOPN Inject 35 Units into the skin at bedtime. 3 pen 2  . insulin lispro (HUMALOG KWIKPEN) 100 UNIT/ML KiwkPen Inject 0.08-0.1 mLs (8-10 Units total) into the skin 3 (three) times daily. 15 mL 2  . metFORMIN (GLUCOPHAGE) 500 MG tablet Take 500 mg by mouth. Patient takes 2 in the morning and 1 at night    . metoprolol succinate (TOPROL-XL) 50 MG 24 hr tablet Take 1 tablet (50 mg total) by mouth daily. 30 tablet 6  . pantoprazole (PROTONIX) 40 MG tablet TAKE ONE TABLET DAILY 30 tablet 3  . traMADol (ULTRAM) 50 MG tablet Take 50 mg by mouth 2 (two) times daily as needed. For pain     . triamcinolone ointment (KENALOG) 0.1 % Apply 1 application topically.     No current facility-administered medications on file prior to visit.     Past Medical History  Diagnosis Date  . Diabetes mellitus   . Hypertension   . Arthritis   . Pulmonary embolism 04/2011  . DVT (deep venous thrombosis), left 04/2011    after an ankle fracture.   Marland Kitchen  Mitral regurgitation 07/2011    moderate     Past Surgical History  Procedure Laterality Date  . Abdominal hysterectomy    . Foot surgery    . Cataract extraction    . Hernia repair    . Tonsillectomy    . Colonoscopy  05/07/2012    Procedure: COLONOSCOPY;  Surgeon: Rogene Houston, MD;  Location: [facility];  Service: Endoscopy;  Laterality: N/A;  245     Family History  Problem Relation Age of Onset  . Hypertension Mother   . Diabetes Mother   . Heart attack Mother      History   Social History  . Marital Status: Single    Spouse Name: N/A  . Number of Children: N/A  . Years of Education: N/A   Occupational History  . Not on file.   Social History  Main Topics  . Smoking status: Never Smoker   . Smokeless tobacco: Never Used  . Alcohol Use: No  . Drug Use: No  . Sexual Activity: Not on file   Other Topics Concern  . Not on file   Social History Narrative     PHYSICAL EXAM   BP 136/66 mmHg  Pulse 73  Ht 5\' 3"  (1.6 m)  Wt 163 lb 1.9 oz (73.991 kg)  BMI 28.90 kg/m2  Constitutional: She is oriented to person, place, and time. She appears well-developed and well-nourished. No distress.  HENT: No nasal discharge.  Head: Normocephalic and atraumatic.  Eyes: Pupils are equal and round. Right eye exhibits no discharge. Left eye exhibits no discharge.  Neck: Normal range of motion. Neck supple. No JVD present. No thyromegaly present.  Cardiovascular: Normal rate, regular rhythm, normal heart sounds. Exam reveals no gallop and no friction rub. No murmur heard.  Pulmonary/Chest: Effort normal and breath sounds normal. No stridor. No respiratory distress. She has no wheezes. She has no rales. She exhibits no tenderness.  Abdominal: Soft. Bowel sounds are normal. She exhibits no distension. There is no tenderness. There is no rebound and no guarding.  Musculoskeletal: Normal range of motion. She exhibits no edema and no tenderness.  Neurological: She is alert and oriented to person, place, and time. Coordination normal.  Skin: Skin is warm and dry. No rash noted. She is not diaphoretic. No erythema. No pallor.  Psychiatric: She has a normal mood and affect. Her behavior is normal. Judgment and thought content normal.   EKG: Normal sinus rhythm with left ventricular hypertrophy and nonspecific T wave changes.   ASSESSMENT AND PLAN

## 2014-08-09 NOTE — Patient Instructions (Addendum)
Medication Instructions:  Your physician recommends that you continue on your current medications as directed. Please refer to the Current Medication list given to you today.  Labwork: No new orders.  Testing/Procedures: Your physician has requested that you have an echocardiogram [facility] office). Echocardiography is a painless test that uses sound waves to create images of your heart. It provides your doctor with information about the size and shape of your heart and how well your heart's chambers and valves are working. This procedure takes approximately one hour. There are no restrictions for this procedure.  Your physician has requested that you have a carotid duplex [facility] office). This test is an ultrasound of the carotid arteries in your neck. It looks at blood flow through these arteries that supply the brain with blood. Allow one hour for this exam. There are no restrictions or special instructions.  Follow-Up: Your physician wants you to follow-up in: 1 YEAR with Dr Fletcher Anon.  You will receive a reminder letter in the mail two months in advance. If you don't receive a letter, please call our office to schedule the follow-up appointment.   Any Other Special Instructions Will Be Listed Below (If Applicable).

## 2014-08-13 DIAGNOSIS — R55 Syncope and collapse: Secondary | ICD-10-CM | POA: Insufficient documentation

## 2014-08-13 NOTE — Assessment & Plan Note (Signed)
She cannot provide me with much details about the episodes of loss of consciousness but these overall did not seem to be cardiac in origin. The prolonged confusion and no recollection of events suggestive more of a neurologic etiology than a cardiac etiology. She has no palpitations and EKG is unremarkable. I doubt arrhythmia. I requested carotid Doppler.

## 2014-08-13 NOTE — Assessment & Plan Note (Signed)
The seem to be well controlled on current dose of Toprol.

## 2014-08-15 ENCOUNTER — Encounter: Payer: Medicare Other | Attending: Internal Medicine | Admitting: Dietician

## 2014-08-15 ENCOUNTER — Encounter: Payer: Self-pay | Admitting: Dietician

## 2014-08-15 ENCOUNTER — Ambulatory Visit: Payer: Medicare Other | Admitting: Dietician

## 2014-08-15 VITALS — Ht 63.0 in | Wt 167.0 lb

## 2014-08-15 DIAGNOSIS — E118 Type 2 diabetes mellitus with unspecified complications: Secondary | ICD-10-CM | POA: Diagnosis present

## 2014-08-15 DIAGNOSIS — Z713 Dietary counseling and surveillance: Secondary | ICD-10-CM | POA: Insufficient documentation

## 2014-08-15 DIAGNOSIS — IMO0002 Reserved for concepts with insufficient information to code with codable children: Secondary | ICD-10-CM

## 2014-08-15 DIAGNOSIS — E1165 Type 2 diabetes mellitus with hyperglycemia: Secondary | ICD-10-CM

## 2014-08-15 NOTE — Progress Notes (Signed)
Medical Nutrition Therapy:  Appt start time: 0215 end time:  [id number].   Assessment:  06/20/14 Primary concerns today:  Patient is here with her son.  She wants to get her blood sugar under control.  Female partner of 40 years died in May 15, 2023.  Patient reports increased depression.  Often does not want to leave house but symptoms become worse when she is alone all day.  Son lives with patient but is gone in the afternoon and evening.  Patient reports having cooked 6 times since her friend's death.  She has been attending grief recover.    She has been checking her blood sugar 2-3 times per day.  CBG's have been fasting:  80-200, after lunch:  >200, and at bed time:  >260.  HgbA1C was 7.7%  04/25/14.  08/15/14: Patient is here for follow up today with son.  Her blood sugars remain extremely elevated with occasional lows.  She showed me her meter with fasting blood sugars 222-334 in the past week.  Symptomatic lows 75-93 three times in the past 3 weeks.  States that she missed her last appointment with Dr. Cruzita Lederer and had to reschedule on May 26th due to showing up late.  Reports that she increased her humalog from 10 units to 14 units before meals.  She states that she feels about the same emotionally as she did last visit 2 months ago (although appears some brighter).  She does verbalize symptoms of increased anxiety and does not like to be alone.  Son spoke with me privately that the neurologist stated that she should not drive due to no feeling n her feet.  She has started physical therapy to improve strength and balance.  She still is not cooking and does not want to eat alone.  Diet is a lot of sandwiches, craves sweets and will have cake occasionally, and high fat foods.  Preferred Learning Style:   No preference indicated   Learning Readiness:   Not ready  Contemplating  MEDICATIONS: see list.  Includes:  Metformin, Lantus, Toujeo Solostar   DIETARY INTAKE: Patient states that she craves  things that are sweet.  24-hr recall:  B (9 AM): oatmeal with splenda and milk or cereal with milk or cheese toast Snk ( AM): graham cracker and peanut butter  L (3-4PM): sandwich or cajun chicken, [place] cornbread, and pea salad  Snk ( PM): graham crackers and peanut butter and fruit D ( PM):  Cereal or sandwich or out to eat Snk ( PM): peanut butter and cracker Beverages: 2% milk or juice, diet caffeine free, decaf coffee with sweet and low, sugar free soda  Usual physical activity: currently in PT.  Walking her with a cane and is quite unsteady.  Estimated energy needs: 1500 calories  170g carbohydrates 112 g protein 42 g fat  Progress Towards Goal(s):  In progress.   Nutritional Diagnosis:  NB-1.1 Food and nutrition-related knowledge deficit As related to balance of carbohydrate, protein, and fat.  As evidenced by diet hx.    Intervention:  Nutrition counseling  Consider counseling Follow up with Dr. Cruzita Lederer about the elevated blood sugar Continue with your physical therapy and continue these exercises when PT is ended Watch the fat intake of your diet.  Choose more foods that are lower in fat, baked and broiled. Consider limiting dessert intake. Consider going to lunch at a local senior center.  Try to have a hot meal daily. (Increase vegetable intake.)   Teaching Method Utilized:  Visual  Auditory  Handouts given during visit include:  My plate  Yellow Meal Plan Card  Snack list  Therapeutic Alternatives, card  Barriers to learning/adherence to lifestyle change: depression  Demonstrated degree of understanding via:  Teach Back   Monitoring/Evaluation:  Dietary intake, exercise, and body weight in one month.

## 2014-08-15 NOTE — Patient Instructions (Addendum)
Consider counseling Follow up with Dr. Cruzita Lederer about the elevated blood sugar Continue with your physical therapy and continue these exercises when PT is ended Watch the fat intake of your diet.  Choose more foods that are lower in fat, baked and broiled. Consider limiting dessert intake. Consider going to lunch at a local senior center.  Try to have a hot meal daily. (Increase vegetable intake.)

## 2014-08-18 ENCOUNTER — Encounter (INDEPENDENT_AMBULATORY_CARE_PROVIDER_SITE_OTHER): Payer: Medicare Other

## 2014-08-18 ENCOUNTER — Other Ambulatory Visit (INDEPENDENT_AMBULATORY_CARE_PROVIDER_SITE_OTHER): Payer: Medicare Other

## 2014-08-18 ENCOUNTER — Other Ambulatory Visit: Payer: Self-pay

## 2014-08-18 DIAGNOSIS — R55 Syncope and collapse: Secondary | ICD-10-CM

## 2014-08-18 DIAGNOSIS — R0602 Shortness of breath: Secondary | ICD-10-CM

## 2014-09-13 ENCOUNTER — Ambulatory Visit: Payer: Medicare Other | Admitting: Dietician

## 2014-09-15 ENCOUNTER — Ambulatory Visit: Payer: Medicare Other | Admitting: Internal Medicine

## 2014-10-04 ENCOUNTER — Telehealth: Payer: Self-pay | Admitting: Cardiovascular Disease

## 2014-10-04 NOTE — Telephone Encounter (Signed)
Left message on machine for pt to contact the office.   

## 2014-10-04 NOTE — Telephone Encounter (Signed)
New Message  Pt called to discuss ECHO and Holter results. Please call

## 2014-10-13 ENCOUNTER — Other Ambulatory Visit: Payer: Self-pay | Admitting: Internal Medicine

## 2014-10-13 ENCOUNTER — Telehealth: Payer: Self-pay | Admitting: Internal Medicine

## 2014-10-13 MED ORDER — INSULIN LISPRO 100 UNIT/ML (KWIKPEN): 8.0000 [IU] | PEN_INJECTOR | Freq: Three times a day (TID) | SUBCUTANEOUS | Status: DC

## 2014-10-13 NOTE — Telephone Encounter (Signed)
Patient called and would like a refill on her Rx  Rx: Humalog  Pharmacy: [facility]  Please advise   Thank you

## 2014-10-13 NOTE — Telephone Encounter (Signed)
Rx sent per pt's request.  

## 2014-10-20 NOTE — Telephone Encounter (Signed)
Please Close Encounter, Thanks// SR

## 2014-10-25 NOTE — Telephone Encounter (Signed)
I will close encounter.  The pt has not contacted the office.

## 2014-11-15 ENCOUNTER — Encounter: Payer: Self-pay | Admitting: Internal Medicine

## 2014-11-15 ENCOUNTER — Other Ambulatory Visit (INDEPENDENT_AMBULATORY_CARE_PROVIDER_SITE_OTHER): Payer: Medicare Other | Admitting: *Deleted

## 2014-11-15 ENCOUNTER — Ambulatory Visit (INDEPENDENT_AMBULATORY_CARE_PROVIDER_SITE_OTHER): Payer: Medicare Other | Admitting: Internal Medicine

## 2014-11-15 VITALS — BP 116/68 | HR 82 | Temp 98.5°F | Resp 12 | Wt 170.6 lb

## 2014-11-15 DIAGNOSIS — E118 Type 2 diabetes mellitus with unspecified complications: Secondary | ICD-10-CM

## 2014-11-15 DIAGNOSIS — IMO0002 Reserved for concepts with insufficient information to code with codable children: Secondary | ICD-10-CM

## 2014-11-15 DIAGNOSIS — E1165 Type 2 diabetes mellitus with hyperglycemia: Secondary | ICD-10-CM

## 2014-11-15 LAB — POCT GLYCOSYLATED HEMOGLOBIN (HGB A1C): Hemoglobin A1C: 8.6

## 2014-11-15 MED ORDER — INSULIN LISPRO 100 UNIT/ML (KWIKPEN): 14.0000 [IU] | PEN_INJECTOR | Freq: Three times a day (TID) | SUBCUTANEOUS | Status: DC

## 2014-11-15 MED ORDER — INSULIN GLARGINE 300 UNIT/ML ~~LOC~~ SOPN: 40.0000 [IU] | PEN_INJECTOR | Freq: Every day | SUBCUTANEOUS | Status: DC

## 2014-11-15 NOTE — Progress Notes (Signed)
Patient ID: Sandra [name], female   DOB: 11/28/1940, 74 y.o.   MRN: [id number]  HPI: Sandra [name] is a 74 y.o.-year-old femalemale, initially referred by her PCP, Dr. Woody Seller, for management of DM2, dx in 2006, insulin-dependent since ~ 8 years ago, uncontrolled, with complications (PN, cerebro-vascular ds - h/o TIA). She is here with her son, who offers part of the hx.   Pt was recently dx with Alzheimer ds.   Last hemoglobin A1c was: 04/13/2014: HbA1c 9.4% Lab Results  Component Value Date   HGBA1C 7.7* 04/26/2011   Pt is on a regimen of: - Metformin 1000 mg in am and 500 mg in pm - Toujeo 35 units 1x a day at bedtime - Novolog 16 units 3x a day We stopped Glimepiride 4 mg bid  Pt checks her sugars 2x a day and they are: - am (9 am - 12 pm): 86-150 >> 70, 80 - 1x a month, 200s-340 - 2h after b'fast: n/c - before lunch: n/c - 2h after lunch: n/c - before dinner: n/c - 2h after dinner: 400-600 >> 300s (?) - bedtime: 323, 344 >> n/c - nighttime: n/c + lows- mostly in the second part of the night and a.m. Lowest sugar was 70s (am) ; she has hypoglycemia awareness at 90s Highest sugar was 300s. She became confused with high sugars before >> did not drive since then.   Glucometer: OneTouch Verio  [name] meals are: - Breakfast: cereal, eggs + bacon, PB cracker - Lunch: sandwich, veggies + meat - Dinner: sandwich, veggies + meat - Snacks: 3-4  - no CKD, last BUN/creatinine:  15/0.75 on 05/07/2013 Lab Results  Component Value Date   BUN 10 04/28/2011   CREATININE 0.49* 04/28/2011   - last set of lipids: No results found for: CHOL, HDL, LDLCALC, LDLDIRECT, TRIG, CHOLHDL - last eye exam was in 10/2013. No DR.  - + numbness; no tingling in her feet.  ROS: Constitutional: no weight gain/loss, no fatigue, nosubjective hyperthermia/hypothermia, + nocturia Eyes: no blurry vision, no xerophthalmia ENT: no sore throat, no nodules palpated in throat, no dysphagia/odynophagia, no  hoarseness, + decreased hearing and tinnitus Cardiovascular: no CP/+ SOB/no palpitations/no leg swelling Respiratory: no cough/+ SOB Gastrointestinal: no N/V/+ D/no C Musculoskeletal: no muscle/joint aches Skin: no rashes Neurological: no tremors/no numbness/tingling/dizziness  I reviewed pt's medications, allergies, PMH, social hx, family hx, and changes were documented in the history of present illness. Otherwise, unchanged from my initial visit note:  Past Medical History  Diagnosis Date  . Diabetes mellitus   . Hypertension   . Arthritis   . Pulmonary embolism 04/2011  . DVT (deep venous thrombosis), left 04/2011    after an ankle fracture.   . Mitral regurgitation 07/2011    moderate   Past Surgical History  Procedure Laterality Date  . Abdominal hysterectomy    . Foot surgery    . Cataract extraction    . Hernia repair    . Tonsillectomy    . Colonoscopy  05/07/2012    Procedure: COLONOSCOPY;  Surgeon: Rogene Houston, MD;  Location: [facility];  Service: Endoscopy;  Laterality: N/A;  245   History   Social History  . Marital Status: Single    Spouse Name: N/A    Number of Children: 2   Occupational History  .  retired    Social History Main Topics  . Smoking status: Never Smoker   . Smokeless tobacco: Never Used  . Alcohol Use: No  .  Drug Use: No   Current Outpatient Prescriptions on File Prior to Visit  Medication Sig Dispense Refill  . amLODipine (NORVASC) 5 MG tablet Take 5 mg by mouth daily.     Marland Kitchen aspirin 81 MG tablet Take 81 mg by mouth daily.    . Calcium Carbonate-Vit D-Min (CALCIUM 1200 PO) Take 1 capsule by mouth daily.     . Cholecalciferol (VITAMIN D-3) 1000 UNITS CAPS Take 1,000 Units by mouth daily.    Marland Kitchen FLUoxetine (PROZAC) 20 MG capsule Take 20 mg by mouth daily.      . insulin glargine (LANTUS) 100 UNIT/ML injection Inject 35 Units into the skin daily.    . Insulin Glargine (TOUJEO SOLOSTAR) 300 UNIT/ML SOPN Inject 35 Units into the skin  at bedtime. 3 pen 2  . insulin lispro (HUMALOG KWIKPEN) 100 UNIT/ML KiwkPen Inject 0.08-0.1 mLs (8-10 Units total) into the skin 3 (three) times daily. 15 mL 0  . metFORMIN (GLUCOPHAGE) 500 MG tablet Take 500 mg by mouth. Patient takes 2 in the morning and 1 at night    . pantoprazole (PROTONIX) 40 MG tablet TAKE ONE TABLET DAILY 30 tablet 3  . traMADol (ULTRAM) 50 MG tablet Take 50 mg by mouth 2 (two) times daily as needed. For pain     . metoprolol succinate (TOPROL-XL) 50 MG 24 hr tablet Take 1 tablet (50 mg total) by mouth daily. 30 tablet 6   No current facility-administered medications on file prior to visit.   Allergies  Allergen Reactions  . Buprenex [Buprenorphine] Nausea And Vomiting   Family History  Problem Relation Age of Onset  . Hypertension Mother   . Diabetes Mother   . Heart attack Mother    PE: BP 116/68 mmHg  Pulse 82  Temp(Src) 98.5 F (36.9 C) (Oral)  Resp 12  Wt 170 lb 9.6 oz (77.384 kg)  SpO2 98% Body mass index is 30.23 kg/(m^2). Wt Readings from Last 3 Encounters:  11/15/14 170 lb 9.6 oz (77.384 kg)  08/15/14 167 lb (75.751 kg)  08/09/14 163 lb 1.9 oz (73.991 kg)   Constitutional: overweight, in NAD Eyes: PERRLA, EOMI, no exophthalmos ENT: moist mucous membranes, no thyromegaly, no cervical lymphadenopathy Cardiovascular: RRR, No MRG Respiratory: CTA B Gastrointestinal: abdomen soft, NT, ND, BS+ Musculoskeletal: + Heberden and Bouchard's nodules in fingers, strength intact in all 4 Skin: moist, warm, no rashes Neurological: Mild tremor with outstretched hands, DTR normal in all 4  ASSESSMENT: 1. DM2, insulin-dependent, uncontrolled, with complications - Peripheral neuropathy - Cerebrovascular disease, history of TIA  PLAN:  1. Patient with long-standing, uncontrolled diabetes, on basal-bolus insulin regimen, with mealtime insulin added at last visit. Her sugars greatly improved after last visit for about 1 mo >> then started to worsen.HbA1c  appears improved, though... No sugar log or meter today and she cannot tell me the sugars... It is difficult to make tx decisions, but since sugars high, will increase Toujeo and move it to am to avoid low CBGs in am (has 70s 1x a mo) - I advised her to:  Patient Instructions  Please increase: - Toujeo to 40 units and move it in am. Continue: - Metformin 1000 mg in am and 500 mg in pm - Novolog 16 units 3x a day  Please return in 1 month with your sugar log.   - referred her to nutrition here in our office. She had 2 appointments - continue checking sugars at different times of the day - check 2-3 times a  day, rotating checks - advised for yearly eye exams - she is up-to-date - check HbA1c >> 8.6% (decreased!) - Return to clinic in 1 mo

## 2014-11-15 NOTE — Patient Instructions (Signed)
Please increase: - Toujeo to 40 units and move it in am. Continue: - Metformin 1000 mg in am and 500 mg in pm - Novolog 16 units 3x a day  Please return in 1 month with your sugar log.

## 2014-12-02 ENCOUNTER — Other Ambulatory Visit: Payer: Self-pay | Admitting: Internal Medicine

## 2015-01-04 ENCOUNTER — Other Ambulatory Visit: Payer: Self-pay | Admitting: Internal Medicine

## 2015-01-05 ENCOUNTER — Ambulatory Visit: Payer: Medicare Other | Admitting: Internal Medicine

## 2015-01-20 ENCOUNTER — Encounter: Payer: Self-pay | Admitting: Internal Medicine

## 2015-01-20 ENCOUNTER — Ambulatory Visit (INDEPENDENT_AMBULATORY_CARE_PROVIDER_SITE_OTHER): Payer: Medicare Other | Admitting: Internal Medicine

## 2015-01-20 VITALS — BP 114/58 | HR 91 | Temp 98.2°F | Resp 12 | Wt 174.8 lb

## 2015-01-20 DIAGNOSIS — E118 Type 2 diabetes mellitus with unspecified complications: Secondary | ICD-10-CM | POA: Diagnosis not present

## 2015-01-20 DIAGNOSIS — E1165 Type 2 diabetes mellitus with hyperglycemia: Secondary | ICD-10-CM

## 2015-01-20 DIAGNOSIS — IMO0002 Reserved for concepts with insufficient information to code with codable children: Secondary | ICD-10-CM

## 2015-01-20 NOTE — Patient Instructions (Signed)
Please continue: - Toujeo to 40 units in am. - Metformin 1000 mg in am and 500 mg in pm - Novolog 16 units 3x a day  Please inject Novolog 15 min before you eat. It is dangerous to inject it 2h in advance.  Please return in 1.5 month with your sugar log.

## 2015-01-20 NOTE — Progress Notes (Signed)
Patient ID: Sandra [name], female   DOB: 12/25/1940, 74 y.o.   MRN: [id number]  HPI: Sandra [name] is a 74 y.o.-year-old female, initially referred by her PCP, Dr. Woody Seller, returning for f/u for DM2, dx in 2006, insulin-dependent since 2008 years ago, uncontrolled, with complications (PN, cerebro-vascular ds - h/o TIA). Last visit was 2 mo ago.  Last hemoglobin A1c was: Lab Results  Component Value Date   HGBA1C 8.6 11/15/2014   HGBA1C 7.7* 04/26/2011  04/13/2014: HbA1c 9.4%  Pt is on a regimen of: - Metformin 1000 mg in am and 500 mg in pm - Toujeo 40 units 1x a day in am - Novolog 16 units 3x a day - in am We stopped Glimepiride 4 mg bid  Pt checks her sugars 2x a day and they are better, but still above goal: - am (9 am - 12 pm): 86-150 >> 70, 80 - 1x a month, 200s-340 >> 58x2, 79-246, 342 - 2h after b'fast: n/c - before lunch: n/c >> 138, 287 - 2h after lunch: n/c >> 90x1 - before dinner: n/c >> 177-235 - 2h after dinner: 400-600 >> 300s (?) >> 280, 318 - bedtime: 323, 344 >> n/c - nighttime: n/c + lows- mostly in the second part of the night and a.m. Lowest sugar was 70s (am) ; she has hypoglycemia awareness at 90s Highest sugar was 300s. She became confused with high sugars before >> did not drive since then.   Glucometer: OneTouch Verio  [name] meals are: - Breakfast: cereal, eggs + bacon, PB cracker - Lunch: sandwich, veggies + meat - Dinner: sandwich, veggies + meat - Snacks: 3-4  - no CKD, last BUN/creatinine:  15/0.75 on 05/07/2013 Lab Results  Component Value Date   BUN 10 04/28/2011   CREATININE 0.49* 04/28/2011   - last set of lipids: No results found for: CHOL, HDL, LDLCALC, LDLDIRECT, TRIG, CHOLHDL - last eye exam was in 10/2013. No DR.  - + numbness; no tingling in her feet.  Pt was recently dx with Alzheimer ds.   She started Valium, Cymbalta. Stopped Prozac.   ROS: Constitutional: no weight gain/loss, no fatigue, nosubjective  hyperthermia/hypothermia, + nocturia Eyes: no blurry vision, no xerophthalmia ENT: no sore throat, no nodules palpated in throat, no dysphagia/odynophagia, no hoarseness, + decreased hearing and tinnitus Cardiovascular: no CP/+ SOB/no palpitations/no leg swelling Respiratory: no cough/+ SOB Gastrointestinal: no N/V/+ D/no C Musculoskeletal: no muscle/joint aches Skin: no rashes Neurological: no tremors/no numbness/tingling/dizziness  I reviewed pt's medications, allergies, PMH, social hx, family hx, and changes were documented in the history of present illness. Otherwise, unchanged from my initial visit note:  Past Medical History  Diagnosis Date  . Diabetes mellitus   . Hypertension   . Arthritis   . Pulmonary embolism 04/2011  . DVT (deep venous thrombosis), left 04/2011    after an ankle fracture.   . Mitral regurgitation 07/2011    moderate   Past Surgical History  Procedure Laterality Date  . Abdominal hysterectomy    . Foot surgery    . Cataract extraction    . Hernia repair    . Tonsillectomy    . Colonoscopy  05/07/2012    Procedure: COLONOSCOPY;  Surgeon: Rogene Houston, MD;  Location: [facility];  Service: Endoscopy;  Laterality: N/A;  245   History   Social History  . Marital Status: Single    Spouse Name: N/A    Number of Children: 2   Occupational History  .  retired    Social History Main Topics  . Smoking status: Never Smoker   . Smokeless tobacco: Never Used  . Alcohol Use: No  . Drug Use: No   Current Outpatient Prescriptions on File Prior to Visit  Medication Sig Dispense Refill  . amLODipine (NORVASC) 5 MG tablet Take 5 mg by mouth daily.     Marland Kitchen aspirin 81 MG tablet Take 81 mg by mouth daily.    . Calcium Carbonate-Vit D-Min (CALCIUM 1200 PO) Take 1 capsule by mouth daily.     . Cholecalciferol (VITAMIN D-3) 1000 UNITS CAPS Take 1,000 Units by mouth daily.    . DULoxetine (CYMBALTA) 60 MG capsule Take 60 mg by mouth daily.    Marland Kitchen FLUoxetine  (PROZAC) 20 MG capsule Take 20 mg by mouth daily.      . hydrochlorothiazide (HYDRODIURIL) 12.5 MG tablet Take 12.5 mg by mouth daily.     . Insulin Glargine (TOUJEO SOLOSTAR) 300 UNIT/ML SOPN Inject 40 Units into the skin at bedtime. 3 pen 2  . insulin lispro (HUMALOG KWIKPEN) 100 UNIT/ML KiwkPen Inject 0.14-0.18 mLs (14-18 Units total) into the skin 3 (three) times daily. 15 mL 2  . insulin lispro (HUMALOG KWIKPEN) 100 UNIT/ML KiwkPen Inject 0.16 mLs (16 Units total) into the skin 3 (three) times daily. 15 mL 2  . memantine (NAMENDA XR) 28 MG CP24 24 hr capsule Take 28 mg by mouth daily.    . metFORMIN (GLUCOPHAGE) 500 MG tablet Take 500 mg by mouth. Patient takes 2 in the morning and 1 at night    . metoprolol succinate (TOPROL-XL) 50 MG 24 hr tablet Take 1 tablet (50 mg total) by mouth daily. 30 tablet 6  . pantoprazole (PROTONIX) 40 MG tablet TAKE ONE TABLET DAILY 30 tablet 3  . traMADol (ULTRAM) 50 MG tablet Take 50 mg by mouth 2 (two) times daily as needed. For pain      No current facility-administered medications on file prior to visit.   Allergies  Allergen Reactions  . Buprenex [Buprenorphine] Nausea And Vomiting   Family History  Problem Relation Age of Onset  . Hypertension Mother   . Diabetes Mother   . Heart attack Mother    PE: BP 114/58 mmHg  Pulse 91  Temp(Src) 98.2 F (36.8 C) (Oral)  Resp 12  Wt 174 lb 12.8 oz (79.289 kg)  SpO2 97% Body mass index is 30.97 kg/(m^2). Wt Readings from Last 3 Encounters:  01/20/15 174 lb 12.8 oz (79.289 kg)  11/15/14 170 lb 9.6 oz (77.384 kg)  08/15/14 167 lb (75.751 kg)   Constitutional: overweight, in NAD, walks with a walker. Eyes: PERRLA, EOMI, no exophthalmos ENT: moist mucous membranes, no thyromegaly, no cervical lymphadenopathy Cardiovascular: RRR, No MRG Respiratory: CTA B Gastrointestinal: abdomen soft, NT, ND, BS+ Musculoskeletal: + Heberden and Bouchard's nodules in fingers, strength intact in all 4 Skin: moist,  warm, no rashes Neurological: Mild tremor with outstretched hands, DTR normal in all 4  ASSESSMENT: 1. DM2, insulin-dependent, uncontrolled, with complications - Peripheral neuropathy - Cerebrovascular disease, history of TIA  PLAN:  1. Patient with long-standing, uncontrolled diabetes, on basal-bolus insulin regimen, now better after addition of mealtime insulin and increasing the basal insulin at last visit. We also moved Toujeo to am to avoid low CBGs. She is given her Novolog inj's randomly by her son >> they need to be moved 15 min before a meal! - Reviewed together last HbA1c (better: 8.6%). - I advised her to:  Patient Instructions  Please continue: - Toujeo to 40 units in am. - Metformin 1000 mg in am and 500 mg in pm - Novolog 16 units 3x a day  Please inject Novolog 15 min before you eat. It is dangerous to inject it 2h in advance.  Please return in 1.5 month with your sugar log.   - continue checking sugars at different times of the day - check 2-3 times a day, rotating checks - advised for yearly eye exams - she needs a new one - Return to clinic in 3 mo

## 2015-01-23 ENCOUNTER — Telehealth: Payer: Self-pay | Admitting: Internal Medicine

## 2015-01-23 NOTE — Telephone Encounter (Signed)
Patient is taking metoprolol succinate (TOPROL-XL) 50 MG 24 hr tablet she taking three pills a day two in the morning on in the evening in the morning, is this correct, please advise

## 2015-01-23 NOTE — Telephone Encounter (Signed)
Gave pt Dr.arida number to call and get clarification about medication

## 2015-01-23 NOTE — Telephone Encounter (Signed)
Please advise below.

## 2015-01-23 NOTE — Telephone Encounter (Signed)
Please check with Dr Fletcher Anon (cardiology) or PCP. I am not prescribing this for her  -I see her for DM2.

## 2015-03-21 ENCOUNTER — Ambulatory Visit: Payer: Medicare Other | Admitting: Internal Medicine

## 2015-04-03 ENCOUNTER — Other Ambulatory Visit: Payer: Self-pay | Admitting: Internal Medicine

## 2015-05-05 DIAGNOSIS — H8109 Meniere's disease, unspecified ear: Secondary | ICD-10-CM | POA: Diagnosis not present

## 2015-05-05 DIAGNOSIS — G43909 Migraine, unspecified, not intractable, without status migrainosus: Secondary | ICD-10-CM | POA: Diagnosis not present

## 2015-05-05 DIAGNOSIS — E1142 Type 2 diabetes mellitus with diabetic polyneuropathy: Secondary | ICD-10-CM | POA: Diagnosis not present

## 2015-05-17 DIAGNOSIS — M5442 Lumbago with sciatica, left side: Secondary | ICD-10-CM | POA: Diagnosis not present

## 2015-05-17 DIAGNOSIS — M9903 Segmental and somatic dysfunction of lumbar region: Secondary | ICD-10-CM | POA: Diagnosis not present

## 2015-05-17 DIAGNOSIS — M47816 Spondylosis without myelopathy or radiculopathy, lumbar region: Secondary | ICD-10-CM | POA: Diagnosis not present

## 2015-05-18 ENCOUNTER — Ambulatory Visit (INDEPENDENT_AMBULATORY_CARE_PROVIDER_SITE_OTHER): Payer: Medicare Other | Admitting: Internal Medicine

## 2015-05-18 ENCOUNTER — Encounter: Payer: Self-pay | Admitting: Internal Medicine

## 2015-05-18 ENCOUNTER — Other Ambulatory Visit (INDEPENDENT_AMBULATORY_CARE_PROVIDER_SITE_OTHER): Payer: Medicare Other | Admitting: *Deleted

## 2015-05-18 VITALS — BP 122/80 | HR 99 | Temp 98.1°F | Resp 12 | Wt 168.8 lb

## 2015-05-18 DIAGNOSIS — E118 Type 2 diabetes mellitus with unspecified complications: Secondary | ICD-10-CM

## 2015-05-18 DIAGNOSIS — Z794 Long term (current) use of insulin: Secondary | ICD-10-CM | POA: Diagnosis not present

## 2015-05-18 DIAGNOSIS — IMO0002 Reserved for concepts with insufficient information to code with codable children: Secondary | ICD-10-CM

## 2015-05-18 DIAGNOSIS — E1165 Type 2 diabetes mellitus with hyperglycemia: Secondary | ICD-10-CM | POA: Diagnosis not present

## 2015-05-18 LAB — POCT GLYCOSYLATED HEMOGLOBIN (HGB A1C): HEMOGLOBIN A1C: 8.3

## 2015-05-18 MED ORDER — INSULIN GLARGINE 300 UNIT/ML ~~LOC~~ SOPN: 36.0000 [IU] | PEN_INJECTOR | SUBCUTANEOUS | Status: DC

## 2015-05-18 MED ORDER — INSULIN LISPRO 100 UNIT/ML (KWIKPEN): 16.0000 [IU] | PEN_INJECTOR | Freq: Three times a day (TID) | SUBCUTANEOUS | Status: DC

## 2015-05-18 NOTE — Progress Notes (Signed)
Sandra [name], female   DOB: 1940-11-25, 75 y.o.   MRN: [id number]  HPI: Sandra [name] is a 75 y.o.-year-old female, initially referred by her PCP, Dr. Woody Seller, returning for f/u for DM2, dx in 2006, insulin-dependent since 2008, uncontrolled, with complications (PN, cerebro-vascular ds - h/o TIA). Last visit was 4 mo ago.  She has sciatica.  Last hemoglobin A1c was: Lab Results  Component Value Date   HGBA1C 8.6 11/15/2014   HGBA1C 7.7* 04/26/2011  04/13/2014: HbA1c 9.4%  Pt is on a regimen of: - Metformin 1000 mg in am and 500 mg in pm - Toujeo 40 units in am - Novolog 16-18 units 3x a day We stopped Glimepiride 4 mg bid  Pt checks her sugars 2x a day and they are: - am (9 am - 12 pm): 86-150 >> 70, 80 - 1x a month, 200s-340 >> 58x2, 79-246, 342 >> 91-102 - 2h after b'fast: n/c - before lunch: n/c >> 138, 287 >> n/c - 2h after lunch: n/c >> 90x1 >> n/c - before dinner: n/c >> 177-235 >> 212-296 - 2h after dinner: 400-600 >> 300s (?) >> 280, 318 >> n/c - bedtime: 323, 344 >> n/c >> 106, 188-280, 362 - nighttime: n/c >> 98 + lows- mostly in the second part of the night and a.m. Lowest sugar was 70s (am) >> 91 ; she has hypoglycemia awareness at 90s Highest sugar was 300s. She became confused with high sugars before >> did not drive since then.   Glucometer: OneTouch Verio  [name] meals are: - Breakfast: cereal, eggs + bacon, PB cracker - Lunch: sandwich, veggies + meat - Dinner: sandwich, veggies + meat - Snacks: 3-4  - no CKD, last BUN/creatinine:  15/0.75 on 05/07/2013 Lab Results  Component Value Date   BUN 10 04/28/2011   CREATININE 0.49* 04/28/2011   - last set of lipids: No results found for: CHOL, HDL, LDLCALC, LDLDIRECT, TRIG, CHOLHDL - last eye exam was in 10/2014  (cannot remember the exact date). No DR.  - + numbness; no tingling in her feet.  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    ROS: Constitutional: no weight gain/loss, no fatigue, nosubjective hyperthermia/hypothermia, + nocturia Eyes: + blurry vision, no xerophthalmia ENT: no sore throat, no nodules palpated in throat, no dysphagia/odynophagia, no hoarseness, + decreased hearing and tinnitus Cardiovascular: no CP/+ SOB/+ palpitations/no leg swelling Respiratory: no cough/SOB Gastrointestinal: no N/V/D/C Musculoskeletal: no muscle/joint aches Skin: no rashes Neurological: + tremors/no numbness/tingling/dizziness  I reviewed pt's medications, allergies, PMH, social hx, family hx, and changes were documented in the history of present illness. She also sopped Namenda, Prozac and Tramadol. She change the Toptol dose. Otherwise, unchanged from my initial visit note:  Past Medical History  Diagnosis Date  . Diabetes mellitus   . Hypertension   . Arthritis   . Pulmonary embolism ([place]) 04/2011  . DVT (deep venous thrombosis), left 04/2011    after an ankle fracture.   . Mitral regurgitation 07/2011    moderate   Past Surgical History  Procedure Laterality Date  . Abdominal hysterectomy    . Foot surgery    . Cataract extraction    . Hernia repair    . Tonsillectomy    . Colonoscopy  05/07/2012    Procedure: COLONOSCOPY;  Surgeon: Rogene Houston, MD;  Location: [facility];  Service: Endoscopy;  Laterality: N/A;  245   History  Social History  . Marital Status: Single    Spouse Name: N/A    Number of Children: 2   Occupational History  .  retired    Social History Main Topics  . Smoking status: Never Smoker   . Smokeless tobacco: Never Used  . Alcohol Use: No  . Drug Use: No   Current Outpatient Prescriptions on File Prior to Visit  Medication Sig Dispense Refill  . amLODipine (NORVASC) 5 MG tablet Take 5 mg by mouth daily.     Marland Kitchen aspirin 81 MG tablet Take 81 mg by mouth daily.    . Calcium Carbonate-Vit D-Min (CALCIUM 1200 PO) Take 1 capsule by mouth daily.     . Cholecalciferol  (VITAMIN D-3) 1000 UNITS CAPS Take 1,000 Units by mouth daily.    . DULoxetine (CYMBALTA) 60 MG capsule Take 60 mg by mouth daily.    Marland Kitchen FLUoxetine (PROZAC) 20 MG capsule Take 20 mg by mouth daily.      Marland Kitchen HUMALOG KWIKPEN 100 UNIT/ML KiwkPen INJECT 16 UNITS SUBCUTANEOUSLY THREE TIMES DAILY 15 mL 1  . hydrochlorothiazide (HYDRODIURIL) 12.5 MG tablet Take 12.5 mg by mouth daily.     . Insulin Glargine (TOUJEO SOLOSTAR) 300 UNIT/ML SOPN Inject 40 Units into the skin at bedtime. 3 pen 2  . insulin lispro (HUMALOG KWIKPEN) 100 UNIT/ML KiwkPen Inject 0.14-0.18 mLs (14-18 Units total) into the skin 3 (three) times daily. 15 mL 2  . memantine (NAMENDA XR) 28 MG CP24 24 hr capsule Take 28 mg by mouth daily.    . metFORMIN (GLUCOPHAGE) 500 MG tablet Take 500 mg by mouth. Sandra takes 2 in the morning and 1 at night    . metoprolol succinate (TOPROL-XL) 50 MG 24 hr tablet Take 1 tablet (50 mg total) by mouth daily. 30 tablet 6  . pantoprazole (PROTONIX) 40 MG tablet TAKE ONE TABLET DAILY 30 tablet 3  . traMADol (ULTRAM) 50 MG tablet Take 50 mg by mouth 2 (two) times daily as needed. For pain      No current facility-administered medications on file prior to visit.   Allergies  Allergen Reactions  . Buprenex [Buprenorphine] Nausea And Vomiting   Family History  Problem Relation Age of Onset  . Hypertension Mother   . Diabetes Mother   . Heart attack Mother    PE: BP 122/80 mmHg  Pulse 99  Temp(Src) 98.1 F (36.7 C) (Oral)  Resp 12  Wt 168 lb 12.8 oz (76.567 kg)  SpO2 97% Body mass index is 29.91 kg/(m^2). Wt Readings from Last 3 Encounters:  05/18/15 168 lb 12.8 oz (76.567 kg)  01/20/15 174 lb 12.8 oz (79.289 kg)  11/15/14 170 lb 9.6 oz (77.384 kg)   Constitutional: overweight, in NAD, walks with a walker. Eyes: PERRLA, EOMI, no exophthalmos ENT: moist mucous membranes, no thyromegaly, no cervical lymphadenopathy Cardiovascular: tachycardia, RR, No MRG Respiratory: CTA  B Gastrointestinal: abdomen soft, NT, ND, BS+ Musculoskeletal: + Heberden and Bouchard's nodules in fingers, strength intact in all 4 Skin: moist, warm, no rashes Neurological: Mild tremor with outstretched hands, DTR normal in all 4  ASSESSMENT: 1. DM2, insulin-dependent, uncontrolled, with complications - Peripheral neuropathy - Cerebrovascular disease, history of TIA  PLAN:  1. Sandra with long-standing, uncontrolled diabetes, on basal-bolus insulin regimen, with still poor control. Sugars in am >> great, but increase throughout the day, especially before dinner >> will increase her lunch insulin. Will decrease Toujeo to avoid lows. I again advised her to try to inject  Novolog before, rather than after meals. - Reviewed together last HbA1c (better: 8.6%). - I advised her to:  Sandra Instructions  Please continue: - Metformin 1000 mg in am and 500 mg in pm  Please decrease: - Toujeo to 36 units in am  Please increase Novolog: - Breakfast: 16-18 units  - Lunch: 20 units  - Dinner: 16-18 units  Please come back for a follow-up appointment in 3 months.  - continue checking sugars at different times of the day - check 2-3 times a day, rotating checks - advised for yearly eye exams - she is UTD - will need to get BMP and Lipid values from PCP - will check HbA1c today >> 8.3% (better) - Return to clinic in 3 mo

## 2015-05-18 NOTE — Patient Instructions (Addendum)
Please continue: - Metformin 1000 mg in am and 500 mg in pm  Please decrease: - Toujeo to 36 units in am  Please increase Novolog: - Breakfast: 16-18 units  - Lunch: 20 units  - Dinner: 16-18 units  Please stop at the lab.  Please come back for a follow-up appointment in 3 months.

## 2015-05-22 DIAGNOSIS — M47816 Spondylosis without myelopathy or radiculopathy, lumbar region: Secondary | ICD-10-CM | POA: Diagnosis not present

## 2015-05-22 DIAGNOSIS — M5442 Lumbago with sciatica, left side: Secondary | ICD-10-CM | POA: Diagnosis not present

## 2015-05-22 DIAGNOSIS — M9903 Segmental and somatic dysfunction of lumbar region: Secondary | ICD-10-CM | POA: Diagnosis not present

## 2015-05-25 DIAGNOSIS — M5442 Lumbago with sciatica, left side: Secondary | ICD-10-CM | POA: Diagnosis not present

## 2015-05-25 DIAGNOSIS — M47816 Spondylosis without myelopathy or radiculopathy, lumbar region: Secondary | ICD-10-CM | POA: Diagnosis not present

## 2015-05-25 DIAGNOSIS — M9903 Segmental and somatic dysfunction of lumbar region: Secondary | ICD-10-CM | POA: Diagnosis not present

## 2015-05-30 DIAGNOSIS — M5442 Lumbago with sciatica, left side: Secondary | ICD-10-CM | POA: Diagnosis not present

## 2015-05-30 DIAGNOSIS — M9903 Segmental and somatic dysfunction of lumbar region: Secondary | ICD-10-CM | POA: Diagnosis not present

## 2015-05-30 DIAGNOSIS — M47816 Spondylosis without myelopathy or radiculopathy, lumbar region: Secondary | ICD-10-CM | POA: Diagnosis not present

## 2015-06-05 DIAGNOSIS — M9903 Segmental and somatic dysfunction of lumbar region: Secondary | ICD-10-CM | POA: Diagnosis not present

## 2015-06-05 DIAGNOSIS — M5442 Lumbago with sciatica, left side: Secondary | ICD-10-CM | POA: Diagnosis not present

## 2015-06-05 DIAGNOSIS — M47816 Spondylosis without myelopathy or radiculopathy, lumbar region: Secondary | ICD-10-CM | POA: Diagnosis not present

## 2015-06-13 DIAGNOSIS — M5442 Lumbago with sciatica, left side: Secondary | ICD-10-CM | POA: Diagnosis not present

## 2015-06-13 DIAGNOSIS — M47816 Spondylosis without myelopathy or radiculopathy, lumbar region: Secondary | ICD-10-CM | POA: Diagnosis not present

## 2015-06-13 DIAGNOSIS — M9903 Segmental and somatic dysfunction of lumbar region: Secondary | ICD-10-CM | POA: Diagnosis not present

## 2015-06-16 DIAGNOSIS — R413 Other amnesia: Secondary | ICD-10-CM | POA: Diagnosis not present

## 2015-06-16 DIAGNOSIS — G40409 Other generalized epilepsy and epileptic syndromes, not intractable, without status epilepticus: Secondary | ICD-10-CM | POA: Diagnosis not present

## 2015-06-16 DIAGNOSIS — E1142 Type 2 diabetes mellitus with diabetic polyneuropathy: Secondary | ICD-10-CM | POA: Diagnosis not present

## 2015-06-27 ENCOUNTER — Encounter: Payer: Self-pay | Admitting: Cardiovascular Disease

## 2015-06-27 ENCOUNTER — Ambulatory Visit (INDEPENDENT_AMBULATORY_CARE_PROVIDER_SITE_OTHER): Payer: Medicare Other | Admitting: Cardiovascular Disease

## 2015-06-27 VITALS — BP 142/78 | HR 74 | Ht 63.0 in | Wt 162.2 lb

## 2015-06-27 DIAGNOSIS — R002 Palpitations: Secondary | ICD-10-CM

## 2015-06-27 DIAGNOSIS — R55 Syncope and collapse: Secondary | ICD-10-CM

## 2015-06-27 NOTE — Patient Instructions (Signed)

## 2015-06-27 NOTE — Progress Notes (Signed)
Cardiology Office Note   Date:  06/27/2015   ID:  Sandra [name], DOB 10-Mar-1941, MRN [id number]  PCP:  Glenda Chroman., MD  Cardiologist:   Kathlyn Sacramento, MD   No chief complaint on file.     History of Present Illness: Sandra [name] is a 75 y.o. female who presents for a follow up visit regarding palpitations due to PVCs and short runs of SVT.  She has chronic medical conditions that include type 2 diabetes and hypertension. In December of 2012 she had a left ankle fracture after a fall. She presented a month later with bilateral pulmonary embolism and left lower extremity DVT. She was treated with Xarelto .  Her cardiac evaluation in May, 2013 included a nuclear stress test which showed no evidence of ischemia with normal ejection fraction. She was evaluated with a 48-hour Holter monitor which showed short runs of supraventricular tachycardia and frequent PVCs.  She was started on Toprol with improvement in symptoms.    She continues to suffer from  significant anxiety and depression after she lost her husband in November of 2015.  She had 2 syncopal episodes last year of unclear etiology. Echocardiogram in April 2016 showed normal LV systolic function with grade 1 diastolic dysfunction, mild tricuspid regurgitation and minimal pulmonary hypertension. Carotid Doppler showed mild nonobstructive atherosclerosis bilaterally. She reports no further episodes of syncope. She has been diagnosed with Mnire's disease. She reports palpitations only at night when she is trying to sleep but she has been very anxious.   Past Medical History  Diagnosis Date  . Diabetes mellitus   . Hypertension   . Arthritis   . Pulmonary embolism ([place]) 04/2011  . DVT (deep venous thrombosis), left 04/2011    after an ankle fracture.   . Mitral regurgitation 07/2011    moderate    Past Surgical History  Procedure Laterality Date  . Abdominal hysterectomy    . Foot surgery    . Cataract extraction     . Hernia repair    . Tonsillectomy    . Colonoscopy  05/07/2012    Procedure: COLONOSCOPY;  Surgeon: Rogene Houston, MD;  Location: [facility];  Service: Endoscopy;  Laterality: N/A;  245     Current Outpatient Prescriptions  Medication Sig Dispense Refill  . amLODipine (NORVASC) 5 MG tablet Take 5 mg by mouth daily.     . Calcium Carbonate-Vit D-Min (CALCIUM 1200 PO) Take 1 capsule by mouth daily.     . Cholecalciferol (VITAMIN D-3) 1000 UNITS CAPS Take 1,000 Units by mouth daily.    . hydrochlorothiazide (HYDRODIURIL) 12.5 MG tablet Take 12.5 mg by mouth daily.     . Insulin Glargine (TOUJEO SOLOSTAR) 300 UNIT/ML SOPN Inject 36 Units into the skin every morning. 3 pen 2  . insulin lispro (HUMALOG KWIKPEN) 100 UNIT/ML KiwkPen Inject 0.16-0.2 mLs (16-20 Units total) into the skin 3 (three) times daily. 15 mL 2  . metFORMIN (GLUCOPHAGE) 500 MG tablet Take 500 mg by mouth. Patient takes 2 in the morning and 1 at night    . metoprolol succinate (TOPROL-XL) 50 MG 24 hr tablet Take 1 tablet (50 mg total) by mouth daily. (Patient taking differently: Take 150 mg by mouth daily. ) 30 tablet 6  . pantoprazole (PROTONIX) 40 MG tablet TAKE ONE TABLET DAILY 30 tablet 3   No current facility-administered medications for this visit.    Allergies:   Buprenex and Other    Social History:  The patient  reports that she has never smoked. She has never used smokeless tobacco. She reports that she does not drink alcohol or use illicit drugs.   Family History:  The patient's family history includes Diabetes in her mother; Heart attack in her mother; Hypertension in her mother.      PHYSICAL EXAM: VS:  Ht 5\' 3"  (1.6 m)  Wt 162 lb 3.2 oz (73.573 kg)  BMI 28.74 kg/m2 , BMI Body mass index is 28.74 kg/(m^2). GEN: Well nourished, well developed, in no acute distress HEENT: normal Neck: no JVD, carotid bruits, or masses Cardiac: RRR; no murmurs, rubs, or gallops,no edema  Respiratory:  clear to  auscultation bilaterally, normal work of breathing GI: soft, nontender, nondistended, + BS MS: no deformity or atrophy Skin: warm and dry, no rash Neuro:  Strength and sensation are intact Psych: euthymic mood, full affect   EKG:  EKG is ordered today. The ekg ordered today demonstrates  normal sinus rhythm with nonspecific ST and T wave changes.   Recent Labs: No results found for requested labs within last 365 days.    Lipid Panel No results found for: CHOL, TRIG, HDL, CHOLHDL, VLDL, LDLCALC, LDLDIRECT    Wt Readings from Last 3 Encounters:  06/27/15 162 lb 3.2 oz (73.573 kg)  05/18/15 168 lb 12.8 oz (76.567 kg)  01/20/15 174 lb 12.8 oz (79.289 kg)        ASSESSMENT AND PLAN:  1.  Palpitations: Due to known PACs and PVCs. Her symptoms seem to be stable overall on Toprol. I suspect that nighttime palpitations are due to anxiety and stress. She has been using Valium with improvement in symptoms.  2. Essential hypertension: Blood pressure is reasonably controlled on current medications.  3. Depression: She follows up with neurology.      Disposition:   FU with me in 1 year  Signed, Kathlyn Sacramento, MD  06/27/2015 11:17 AM    [address]

## 2015-07-04 DIAGNOSIS — M47816 Spondylosis without myelopathy or radiculopathy, lumbar region: Secondary | ICD-10-CM | POA: Diagnosis not present

## 2015-07-04 DIAGNOSIS — M9903 Segmental and somatic dysfunction of lumbar region: Secondary | ICD-10-CM | POA: Diagnosis not present

## 2015-07-04 DIAGNOSIS — M5442 Lumbago with sciatica, left side: Secondary | ICD-10-CM | POA: Diagnosis not present

## 2015-07-25 DIAGNOSIS — M47816 Spondylosis without myelopathy or radiculopathy, lumbar region: Secondary | ICD-10-CM | POA: Diagnosis not present

## 2015-07-25 DIAGNOSIS — M9903 Segmental and somatic dysfunction of lumbar region: Secondary | ICD-10-CM | POA: Diagnosis not present

## 2015-07-25 DIAGNOSIS — M5442 Lumbago with sciatica, left side: Secondary | ICD-10-CM | POA: Diagnosis not present

## 2015-08-07 DIAGNOSIS — I10 Essential (primary) hypertension: Secondary | ICD-10-CM | POA: Diagnosis not present

## 2015-08-07 DIAGNOSIS — E119 Type 2 diabetes mellitus without complications: Secondary | ICD-10-CM | POA: Diagnosis not present

## 2015-08-15 ENCOUNTER — Telehealth: Payer: Self-pay | Admitting: *Deleted

## 2015-08-15 NOTE — Telephone Encounter (Signed)
Okay, no problem, I will check them when she comes on Thursday. Thank you for checking on this.

## 2015-08-15 NOTE — Telephone Encounter (Signed)
Spoke with Medical Records at Dr Woody Seller' office. She advised that the dr ordered her to have those labs (and others) done in October. She did not have them done. She also checked at the hospital there and the cardiology office and they were not done there either. There are no recent records for a CMP or lipids. Please advise.

## 2015-08-17 ENCOUNTER — Ambulatory Visit: Payer: Medicare Other | Admitting: Internal Medicine

## 2015-08-29 DIAGNOSIS — E119 Type 2 diabetes mellitus without complications: Secondary | ICD-10-CM | POA: Diagnosis not present

## 2015-08-29 DIAGNOSIS — I10 Essential (primary) hypertension: Secondary | ICD-10-CM | POA: Diagnosis not present

## 2015-09-01 DIAGNOSIS — Z1231 Encounter for screening mammogram for malignant neoplasm of breast: Secondary | ICD-10-CM | POA: Diagnosis not present

## 2015-10-10 DIAGNOSIS — E1140 Type 2 diabetes mellitus with diabetic neuropathy, unspecified: Secondary | ICD-10-CM | POA: Diagnosis not present

## 2015-10-10 DIAGNOSIS — L110 Acquired keratosis follicularis: Secondary | ICD-10-CM | POA: Diagnosis not present

## 2015-10-10 DIAGNOSIS — L609 Nail disorder, unspecified: Secondary | ICD-10-CM | POA: Diagnosis not present

## 2015-10-19 DIAGNOSIS — I10 Essential (primary) hypertension: Secondary | ICD-10-CM | POA: Diagnosis not present

## 2015-10-19 DIAGNOSIS — E119 Type 2 diabetes mellitus without complications: Secondary | ICD-10-CM | POA: Diagnosis not present

## 2015-10-23 ENCOUNTER — Ambulatory Visit: Payer: Medicare Other | Admitting: Internal Medicine

## 2015-10-29 DIAGNOSIS — Z86718 Personal history of other venous thrombosis and embolism: Secondary | ICD-10-CM | POA: Diagnosis not present

## 2015-10-29 DIAGNOSIS — M1990 Unspecified osteoarthritis, unspecified site: Secondary | ICD-10-CM | POA: Diagnosis not present

## 2015-10-29 DIAGNOSIS — M79604 Pain in right leg: Secondary | ICD-10-CM | POA: Diagnosis not present

## 2015-10-29 DIAGNOSIS — Z86711 Personal history of pulmonary embolism: Secondary | ICD-10-CM | POA: Diagnosis not present

## 2015-10-29 DIAGNOSIS — M7989 Other specified soft tissue disorders: Secondary | ICD-10-CM | POA: Diagnosis not present

## 2015-10-29 DIAGNOSIS — E1165 Type 2 diabetes mellitus with hyperglycemia: Secondary | ICD-10-CM | POA: Diagnosis not present

## 2015-10-29 DIAGNOSIS — I10 Essential (primary) hypertension: Secondary | ICD-10-CM | POA: Diagnosis not present

## 2015-10-29 DIAGNOSIS — I82411 Acute embolism and thrombosis of right femoral vein: Secondary | ICD-10-CM | POA: Diagnosis not present

## 2015-10-30 DIAGNOSIS — E119 Type 2 diabetes mellitus without complications: Secondary | ICD-10-CM | POA: Diagnosis not present

## 2015-10-30 DIAGNOSIS — I825Z9 Chronic embolism and thrombosis of unspecified deep veins of unspecified distal lower extremity: Secondary | ICD-10-CM | POA: Diagnosis not present

## 2015-10-30 DIAGNOSIS — Z86718 Personal history of other venous thrombosis and embolism: Secondary | ICD-10-CM | POA: Diagnosis not present

## 2015-10-30 DIAGNOSIS — F329 Major depressive disorder, single episode, unspecified: Secondary | ICD-10-CM | POA: Diagnosis not present

## 2015-10-30 DIAGNOSIS — M6281 Muscle weakness (generalized): Secondary | ICD-10-CM | POA: Diagnosis not present

## 2015-10-30 DIAGNOSIS — Z803 Family history of malignant neoplasm of breast: Secondary | ICD-10-CM | POA: Diagnosis not present

## 2015-10-30 DIAGNOSIS — Z794 Long term (current) use of insulin: Secondary | ICD-10-CM | POA: Diagnosis not present

## 2015-10-30 DIAGNOSIS — M1990 Unspecified osteoarthritis, unspecified site: Secondary | ICD-10-CM | POA: Diagnosis present

## 2015-10-30 DIAGNOSIS — I82411 Acute embolism and thrombosis of right femoral vein: Secondary | ICD-10-CM | POA: Diagnosis not present

## 2015-10-30 DIAGNOSIS — Z7901 Long term (current) use of anticoagulants: Secondary | ICD-10-CM | POA: Diagnosis not present

## 2015-10-30 DIAGNOSIS — Z9119 Patient's noncompliance with other medical treatment and regimen: Secondary | ICD-10-CM | POA: Diagnosis not present

## 2015-10-30 DIAGNOSIS — E1165 Type 2 diabetes mellitus with hyperglycemia: Secondary | ICD-10-CM | POA: Diagnosis not present

## 2015-10-30 DIAGNOSIS — Z8673 Personal history of transient ischemic attack (TIA), and cerebral infarction without residual deficits: Secondary | ICD-10-CM | POA: Diagnosis not present

## 2015-10-30 DIAGNOSIS — Z79899 Other long term (current) drug therapy: Secondary | ICD-10-CM | POA: Diagnosis not present

## 2015-10-30 DIAGNOSIS — I10 Essential (primary) hypertension: Secondary | ICD-10-CM | POA: Diagnosis not present

## 2015-10-30 DIAGNOSIS — I824Z1 Acute embolism and thrombosis of unspecified deep veins of right distal lower extremity: Secondary | ICD-10-CM | POA: Diagnosis not present

## 2015-10-30 DIAGNOSIS — Z800 Family history of malignant neoplasm of digestive organs: Secondary | ICD-10-CM | POA: Diagnosis not present

## 2015-10-30 DIAGNOSIS — J302 Other seasonal allergic rhinitis: Secondary | ICD-10-CM | POA: Diagnosis not present

## 2015-10-30 DIAGNOSIS — R262 Difficulty in walking, not elsewhere classified: Secondary | ICD-10-CM | POA: Diagnosis not present

## 2015-10-30 DIAGNOSIS — Z888 Allergy status to other drugs, medicaments and biological substances status: Secondary | ICD-10-CM | POA: Diagnosis not present

## 2015-10-30 DIAGNOSIS — Z86711 Personal history of pulmonary embolism: Secondary | ICD-10-CM | POA: Diagnosis not present

## 2015-11-02 DIAGNOSIS — Z86718 Personal history of other venous thrombosis and embolism: Secondary | ICD-10-CM | POA: Diagnosis not present

## 2015-11-02 DIAGNOSIS — J302 Other seasonal allergic rhinitis: Secondary | ICD-10-CM | POA: Diagnosis not present

## 2015-11-02 DIAGNOSIS — E1165 Type 2 diabetes mellitus with hyperglycemia: Secondary | ICD-10-CM | POA: Diagnosis not present

## 2015-11-02 DIAGNOSIS — Z7901 Long term (current) use of anticoagulants: Secondary | ICD-10-CM | POA: Diagnosis not present

## 2015-11-02 DIAGNOSIS — I82411 Acute embolism and thrombosis of right femoral vein: Secondary | ICD-10-CM | POA: Diagnosis not present

## 2015-11-02 DIAGNOSIS — R300 Dysuria: Secondary | ICD-10-CM | POA: Diagnosis not present

## 2015-11-02 DIAGNOSIS — E119 Type 2 diabetes mellitus without complications: Secondary | ICD-10-CM | POA: Diagnosis not present

## 2015-11-02 DIAGNOSIS — I825Z9 Chronic embolism and thrombosis of unspecified deep veins of unspecified distal lower extremity: Secondary | ICD-10-CM | POA: Diagnosis not present

## 2015-11-02 DIAGNOSIS — I82509 Chronic embolism and thrombosis of unspecified deep veins of unspecified lower extremity: Secondary | ICD-10-CM | POA: Diagnosis not present

## 2015-11-02 DIAGNOSIS — M1990 Unspecified osteoarthritis, unspecified site: Secondary | ICD-10-CM | POA: Diagnosis not present

## 2015-11-02 DIAGNOSIS — R262 Difficulty in walking, not elsewhere classified: Secondary | ICD-10-CM | POA: Diagnosis not present

## 2015-11-02 DIAGNOSIS — I824Z1 Acute embolism and thrombosis of unspecified deep veins of right distal lower extremity: Secondary | ICD-10-CM | POA: Diagnosis not present

## 2015-11-02 DIAGNOSIS — I82409 Acute embolism and thrombosis of unspecified deep veins of unspecified lower extremity: Secondary | ICD-10-CM | POA: Diagnosis not present

## 2015-11-02 DIAGNOSIS — I10 Essential (primary) hypertension: Secondary | ICD-10-CM | POA: Diagnosis not present

## 2015-11-02 DIAGNOSIS — F329 Major depressive disorder, single episode, unspecified: Secondary | ICD-10-CM | POA: Diagnosis not present

## 2015-11-02 DIAGNOSIS — M6281 Muscle weakness (generalized): Secondary | ICD-10-CM | POA: Diagnosis not present

## 2015-11-02 DIAGNOSIS — Z8673 Personal history of transient ischemic attack (TIA), and cerebral infarction without residual deficits: Secondary | ICD-10-CM | POA: Diagnosis not present

## 2015-11-03 DIAGNOSIS — I82409 Acute embolism and thrombosis of unspecified deep veins of unspecified lower extremity: Secondary | ICD-10-CM | POA: Diagnosis not present

## 2015-11-03 DIAGNOSIS — I10 Essential (primary) hypertension: Secondary | ICD-10-CM | POA: Diagnosis not present

## 2015-11-03 DIAGNOSIS — E119 Type 2 diabetes mellitus without complications: Secondary | ICD-10-CM | POA: Diagnosis not present

## 2015-11-28 DIAGNOSIS — I82509 Chronic embolism and thrombosis of unspecified deep veins of unspecified lower extremity: Secondary | ICD-10-CM | POA: Diagnosis not present

## 2015-11-28 DIAGNOSIS — R300 Dysuria: Secondary | ICD-10-CM | POA: Diagnosis not present

## 2015-12-16 DIAGNOSIS — F329 Major depressive disorder, single episode, unspecified: Secondary | ICD-10-CM | POA: Diagnosis not present

## 2015-12-16 DIAGNOSIS — Z794 Long term (current) use of insulin: Secondary | ICD-10-CM | POA: Diagnosis not present

## 2015-12-16 DIAGNOSIS — Z7984 Long term (current) use of oral hypoglycemic drugs: Secondary | ICD-10-CM | POA: Diagnosis not present

## 2015-12-16 DIAGNOSIS — M6281 Muscle weakness (generalized): Secondary | ICD-10-CM | POA: Diagnosis not present

## 2015-12-16 DIAGNOSIS — E119 Type 2 diabetes mellitus without complications: Secondary | ICD-10-CM | POA: Diagnosis not present

## 2015-12-16 DIAGNOSIS — M1990 Unspecified osteoarthritis, unspecified site: Secondary | ICD-10-CM | POA: Diagnosis not present

## 2015-12-16 DIAGNOSIS — I82401 Acute embolism and thrombosis of unspecified deep veins of right lower extremity: Secondary | ICD-10-CM | POA: Diagnosis not present

## 2015-12-16 DIAGNOSIS — Z5181 Encounter for therapeutic drug level monitoring: Secondary | ICD-10-CM | POA: Diagnosis not present

## 2015-12-16 DIAGNOSIS — I10 Essential (primary) hypertension: Secondary | ICD-10-CM | POA: Diagnosis not present

## 2015-12-18 DIAGNOSIS — M6281 Muscle weakness (generalized): Secondary | ICD-10-CM | POA: Diagnosis not present

## 2015-12-18 DIAGNOSIS — M1990 Unspecified osteoarthritis, unspecified site: Secondary | ICD-10-CM | POA: Diagnosis not present

## 2015-12-18 DIAGNOSIS — I82401 Acute embolism and thrombosis of unspecified deep veins of right lower extremity: Secondary | ICD-10-CM | POA: Diagnosis not present

## 2015-12-18 DIAGNOSIS — E119 Type 2 diabetes mellitus without complications: Secondary | ICD-10-CM | POA: Diagnosis not present

## 2015-12-18 DIAGNOSIS — I10 Essential (primary) hypertension: Secondary | ICD-10-CM | POA: Diagnosis not present

## 2015-12-18 DIAGNOSIS — F329 Major depressive disorder, single episode, unspecified: Secondary | ICD-10-CM | POA: Diagnosis not present

## 2015-12-19 DIAGNOSIS — I82401 Acute embolism and thrombosis of unspecified deep veins of right lower extremity: Secondary | ICD-10-CM | POA: Diagnosis not present

## 2015-12-19 DIAGNOSIS — M6281 Muscle weakness (generalized): Secondary | ICD-10-CM | POA: Diagnosis not present

## 2015-12-19 DIAGNOSIS — F329 Major depressive disorder, single episode, unspecified: Secondary | ICD-10-CM | POA: Diagnosis not present

## 2015-12-19 DIAGNOSIS — E119 Type 2 diabetes mellitus without complications: Secondary | ICD-10-CM | POA: Diagnosis not present

## 2015-12-19 DIAGNOSIS — M1990 Unspecified osteoarthritis, unspecified site: Secondary | ICD-10-CM | POA: Diagnosis not present

## 2015-12-19 DIAGNOSIS — I10 Essential (primary) hypertension: Secondary | ICD-10-CM | POA: Diagnosis not present

## 2015-12-20 DIAGNOSIS — E119 Type 2 diabetes mellitus without complications: Secondary | ICD-10-CM | POA: Diagnosis not present

## 2015-12-20 DIAGNOSIS — M1990 Unspecified osteoarthritis, unspecified site: Secondary | ICD-10-CM | POA: Diagnosis not present

## 2015-12-20 DIAGNOSIS — F329 Major depressive disorder, single episode, unspecified: Secondary | ICD-10-CM | POA: Diagnosis not present

## 2015-12-20 DIAGNOSIS — I10 Essential (primary) hypertension: Secondary | ICD-10-CM | POA: Diagnosis not present

## 2015-12-20 DIAGNOSIS — I82401 Acute embolism and thrombosis of unspecified deep veins of right lower extremity: Secondary | ICD-10-CM | POA: Diagnosis not present

## 2015-12-20 DIAGNOSIS — M6281 Muscle weakness (generalized): Secondary | ICD-10-CM | POA: Diagnosis not present

## 2015-12-21 DIAGNOSIS — Z09 Encounter for follow-up examination after completed treatment for conditions other than malignant neoplasm: Secondary | ICD-10-CM | POA: Diagnosis not present

## 2015-12-21 DIAGNOSIS — M1990 Unspecified osteoarthritis, unspecified site: Secondary | ICD-10-CM | POA: Diagnosis not present

## 2015-12-21 DIAGNOSIS — E119 Type 2 diabetes mellitus without complications: Secondary | ICD-10-CM | POA: Diagnosis not present

## 2015-12-21 DIAGNOSIS — R5383 Other fatigue: Secondary | ICD-10-CM | POA: Diagnosis not present

## 2015-12-21 DIAGNOSIS — M6281 Muscle weakness (generalized): Secondary | ICD-10-CM | POA: Diagnosis not present

## 2015-12-21 DIAGNOSIS — I82401 Acute embolism and thrombosis of unspecified deep veins of right lower extremity: Secondary | ICD-10-CM | POA: Diagnosis not present

## 2015-12-21 DIAGNOSIS — I10 Essential (primary) hypertension: Secondary | ICD-10-CM | POA: Diagnosis not present

## 2015-12-21 DIAGNOSIS — F329 Major depressive disorder, single episode, unspecified: Secondary | ICD-10-CM | POA: Diagnosis not present

## 2015-12-25 DIAGNOSIS — I10 Essential (primary) hypertension: Secondary | ICD-10-CM | POA: Diagnosis not present

## 2015-12-25 DIAGNOSIS — F329 Major depressive disorder, single episode, unspecified: Secondary | ICD-10-CM | POA: Diagnosis not present

## 2015-12-25 DIAGNOSIS — M6281 Muscle weakness (generalized): Secondary | ICD-10-CM | POA: Diagnosis not present

## 2015-12-25 DIAGNOSIS — M1990 Unspecified osteoarthritis, unspecified site: Secondary | ICD-10-CM | POA: Diagnosis not present

## 2015-12-25 DIAGNOSIS — E119 Type 2 diabetes mellitus without complications: Secondary | ICD-10-CM | POA: Diagnosis not present

## 2015-12-25 DIAGNOSIS — I82401 Acute embolism and thrombosis of unspecified deep veins of right lower extremity: Secondary | ICD-10-CM | POA: Diagnosis not present

## 2015-12-26 DIAGNOSIS — I10 Essential (primary) hypertension: Secondary | ICD-10-CM | POA: Diagnosis not present

## 2015-12-26 DIAGNOSIS — E119 Type 2 diabetes mellitus without complications: Secondary | ICD-10-CM | POA: Diagnosis not present

## 2015-12-26 DIAGNOSIS — F329 Major depressive disorder, single episode, unspecified: Secondary | ICD-10-CM | POA: Diagnosis not present

## 2015-12-26 DIAGNOSIS — M6281 Muscle weakness (generalized): Secondary | ICD-10-CM | POA: Diagnosis not present

## 2015-12-26 DIAGNOSIS — I82401 Acute embolism and thrombosis of unspecified deep veins of right lower extremity: Secondary | ICD-10-CM | POA: Diagnosis not present

## 2015-12-26 DIAGNOSIS — M1990 Unspecified osteoarthritis, unspecified site: Secondary | ICD-10-CM | POA: Diagnosis not present

## 2015-12-28 DIAGNOSIS — E119 Type 2 diabetes mellitus without complications: Secondary | ICD-10-CM | POA: Diagnosis not present

## 2015-12-28 DIAGNOSIS — M1990 Unspecified osteoarthritis, unspecified site: Secondary | ICD-10-CM | POA: Diagnosis not present

## 2015-12-28 DIAGNOSIS — I82401 Acute embolism and thrombosis of unspecified deep veins of right lower extremity: Secondary | ICD-10-CM | POA: Diagnosis not present

## 2015-12-28 DIAGNOSIS — M6281 Muscle weakness (generalized): Secondary | ICD-10-CM | POA: Diagnosis not present

## 2015-12-28 DIAGNOSIS — F329 Major depressive disorder, single episode, unspecified: Secondary | ICD-10-CM | POA: Diagnosis not present

## 2015-12-28 DIAGNOSIS — I10 Essential (primary) hypertension: Secondary | ICD-10-CM | POA: Diagnosis not present

## 2015-12-29 ENCOUNTER — Encounter: Payer: Self-pay | Admitting: Internal Medicine

## 2015-12-29 ENCOUNTER — Ambulatory Visit (INDEPENDENT_AMBULATORY_CARE_PROVIDER_SITE_OTHER): Payer: Medicare Other | Admitting: Internal Medicine

## 2015-12-29 VITALS — BP 124/70 | HR 81 | Ht 61.0 in | Wt 171.0 lb

## 2015-12-29 DIAGNOSIS — M6281 Muscle weakness (generalized): Secondary | ICD-10-CM | POA: Diagnosis not present

## 2015-12-29 DIAGNOSIS — E119 Type 2 diabetes mellitus without complications: Secondary | ICD-10-CM | POA: Diagnosis not present

## 2015-12-29 DIAGNOSIS — I10 Essential (primary) hypertension: Secondary | ICD-10-CM | POA: Diagnosis not present

## 2015-12-29 DIAGNOSIS — E118 Type 2 diabetes mellitus with unspecified complications: Secondary | ICD-10-CM | POA: Diagnosis not present

## 2015-12-29 DIAGNOSIS — I82401 Acute embolism and thrombosis of unspecified deep veins of right lower extremity: Secondary | ICD-10-CM | POA: Diagnosis not present

## 2015-12-29 DIAGNOSIS — F329 Major depressive disorder, single episode, unspecified: Secondary | ICD-10-CM | POA: Diagnosis not present

## 2015-12-29 DIAGNOSIS — Z794 Long term (current) use of insulin: Secondary | ICD-10-CM | POA: Diagnosis not present

## 2015-12-29 DIAGNOSIS — M1990 Unspecified osteoarthritis, unspecified site: Secondary | ICD-10-CM | POA: Diagnosis not present

## 2015-12-29 DIAGNOSIS — E1165 Type 2 diabetes mellitus with hyperglycemia: Secondary | ICD-10-CM | POA: Diagnosis not present

## 2015-12-29 DIAGNOSIS — IMO0002 Reserved for concepts with insufficient information to code with codable children: Secondary | ICD-10-CM

## 2015-12-29 LAB — POCT GLYCOSYLATED HEMOGLOBIN (HGB A1C)

## 2015-12-29 MED ORDER — METFORMIN HCL 500 MG PO TABS: 500.0000 mg | ORAL_TABLET | Freq: Two times a day (BID) | ORAL | 3 refills | Status: DC

## 2015-12-29 MED ORDER — INSULIN GLARGINE 300 UNIT/ML ~~LOC~~ SOPN: 36.0000 [IU] | PEN_INJECTOR | SUBCUTANEOUS | 3 refills | Status: DC

## 2015-12-29 MED ORDER — INSULIN LISPRO 100 UNIT/ML (KWIKPEN): 16.0000 [IU] | PEN_INJECTOR | Freq: Three times a day (TID) | SUBCUTANEOUS | 3 refills | Status: DC

## 2015-12-29 NOTE — Patient Instructions (Addendum)
Please change the regimen as follows: - Metformin 500 mg 2x a day - Toujeo or Lantus 36 units in am - Humalog: - Breakfast: 16-18 units  - Lunch: 20 units  - Dinner: 16-18 units  Please come back for a follow-up appointment in 1.5 months.

## 2015-12-29 NOTE — Progress Notes (Signed)
Patient ID: Sandra [name], female   DOB: June 11, 1940, 75 y.o.   MRN: [id number]  HPI: Sandra [name] is a 75 y.o.-year-old female, initially referred by her PCP, Dr. Woody [name], returning for f/u for DM2, dx in 2006, insulin-dependent since 2008, uncontrolled, with complications (PN, cerebro-vascular ds - h/o TIA). Last visit was 4 mo ago.  She has sciatica.  Last hemoglobin A1c was: Lab Results  Component Value Date   HGBA1C 8.3 05/18/2015   HGBA1C 8.6 11/15/2014   HGBA1C 7.7 (H) 04/26/2011  04/13/2014: HbA1c 9.4%  Pt is on a regimen of: - Metformin 1000 mg in am and 500 mg in pm - Toujeo 40 units in am - Novolog 16-18 units 3x a day We stopped Glimepiride 4 mg bid  Pt checks her sugars 2x a day and they are: - am (9 am - 12 pm): 86-150 >> 70, 80 - 1x a month, 200s-340 >> 58x2, 79-246, 342 >> 91-102 >> 98-174, 194 - 2h after b'fast: n/c  - before lunch: n/c >> 138, 287 >> n/c >> 270-309 - 2h after lunch: n/c >> 90x1 >> n/c - before dinner: n/c >> 177-235 >> 212-296 >> 178-412, 500 - 2h after dinner: 400-600 >> 300s (?) >> 280, 318 >> n/c >> 214-509 - bedtime: 323, 344 >> n/c >> 106, 188-280, 362 >>  see above - nighttime: n/c >> 98 >> n/c + lows- mostly in the second part of the night and a.m. Lowest sugar was 70s (am) >> 91 >> 98 ; she has hypoglycemia awareness at 90s Highest sugar was 300s >> 500s. She became confused with high sugars before >> did not drive since then.   Glucometer: OneTouch Verio  [name] meals are: - Breakfast: cereal, eggs + bacon, PB cracker - Lunch: sandwich, veggies + meat - Dinner: sandwich, veggies + meat - Snacks: 3-4  - no CKD, last BUN/creatinine:  15/0.75 on 05/07/2013 Lab Results  Component Value Date   BUN 10 04/28/2011   CREATININE 0.49 (L) 04/28/2011   - last set of lipids: No results found for: CHOL, HDL, LDLCALC, LDLDIRECT, TRIG, CHOLHDL - last eye exam was in 10/2014  (cannot remember the exact date). No DR.  - + numbness; no tingling  in her feet.  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   ROS: Constitutional: no weight gain/loss, no fatigue, nosubjective hyperthermia/hypothermia, + nocturia Eyes: + blurry vision, no xerophthalmia ENT: no sore throat, no nodules palpated in throat, no dysphagia/odynophagia, no hoarseness, + decreased hearing and tinnitus Cardiovascular: no CP/SOB/palpitations/+ leg swelling Respiratory: no cough/SOB Gastrointestinal: no N/V/D/C Musculoskeletal: no muscle/joint aches Skin: no rashes Neurological: + tremors/no numbness/tingling/dizziness  I reviewed pt's medications, allergies, PMH, social hx, family hx, and changes were documented in the history of present illness.  Otherwise, unchanged from my initial visit note:  Past Medical History:  Diagnosis Date  . Arthritis   . Diabetes mellitus   . DVT (deep venous thrombosis), left 04/2011   after an ankle fracture.   . Hypertension   . Mitral regurgitation 07/2011   moderate  . Pulmonary embolism ([place]) 04/2011   Past Surgical History:  Procedure Laterality Date  . ABDOMINAL HYSTERECTOMY    . CATARACT EXTRACTION    . COLONOSCOPY  05/07/2012   Procedure: COLONOSCOPY;  Surgeon: Rogene Houston, MD;  Location: [facility];  Service: Endoscopy;  Laterality: N/A;  245  . FOOT SURGERY    . HERNIA  REPAIR    . TONSILLECTOMY     History   Social History  . Marital Status: Single    Spouse Name: N/A    Number of Children: 2   Occupational History  .  retired    Social History Main Topics  . Smoking status: Never Smoker   . Smokeless tobacco: Never Used  . Alcohol Use: No  . Drug Use: No   Current Outpatient Prescriptions on File Prior to Visit  Medication Sig Dispense Refill  . amLODipine (NORVASC) 5 MG tablet Take 5 mg by mouth daily.     . Calcium Carbonate-Vit D-Min (CALCIUM 1200 PO) Take 1 capsule by mouth daily.     . Cholecalciferol (VITAMIN D-3) 1000 UNITS CAPS Take 1,000  Units by mouth daily.    . hydrochlorothiazide (HYDRODIURIL) 12.5 MG tablet Take 12.5 mg by mouth daily.     . Insulin Glargine (TOUJEO SOLOSTAR) 300 UNIT/ML SOPN Inject 36 Units into the skin every morning. 3 pen 2  . insulin lispro (HUMALOG KWIKPEN) 100 UNIT/ML KiwkPen Inject 0.16-0.2 mLs (16-20 Units total) into the skin 3 (three) times daily. 15 mL 2  . metFORMIN (GLUCOPHAGE) 500 MG tablet Take 500 mg by mouth. Patient takes 2 in the morning and 1 at night    . pantoprazole (PROTONIX) 40 MG tablet TAKE ONE TABLET DAILY 30 tablet 3  . metoprolol succinate (TOPROL-XL) 50 MG 24 hr tablet Take 1 tablet (50 mg total) by mouth daily. (Patient taking differently: Take 150 mg by mouth daily. ) 30 tablet 6   No current facility-administered medications on file prior to visit.    Allergies  Allergen Reactions  . Buprenex [Buprenorphine] Nausea And Vomiting  . Namenda [Memantine Hcl] Other (See Comments)    Sores in the mouth  . Other Other (See Comments)    Shot for pain caused severe nausea. Had to spend the night in the hospital. Shot to numb mouth at the dentist = made her feel like she was smothering.    Family History  Problem Relation Age of Onset  . Hypertension Mother   . Diabetes Mother   . Heart attack Mother    PE: BP 124/70 (BP Location: Left Arm, Patient Position: Sitting)   Pulse 81   Ht 5\' 1"  (1.549 m)   Wt 171 lb (77.6 kg)   SpO2 97%   BMI 32.31 kg/m  Body mass index is 32.31 kg/m. Wt Readings from Last 3 Encounters:  12/29/15 171 lb (77.6 kg)  06/27/15 162 lb 3.2 oz (73.6 kg)  05/18/15 168 lb 12.8 oz (76.6 kg)   Constitutional: overweight, in NAD, walks with a walker. Eyes: PERRLA, EOMI, no exophthalmos ENT: moist mucous membranes, no thyromegaly, no cervical lymphadenopathy Cardiovascular: tachycardia, RR, No MRG, + Bilateral lower extremity edema Respiratory: CTA B Gastrointestinal: abdomen soft, NT, ND, BS+ Musculoskeletal: + Heberden and Bouchard's nodules  in fingers, strength intact in all 4 Skin: moist, warm, + stasis dermatitis bilaterally Neurological: Mild tremor with outstretched hands, DTR normal in all 4  ASSESSMENT: 1. DM2, insulin-dependent, uncontrolled, with complications - Peripheral neuropathy - Cerebrovascular disease, history of TIA  PLAN:  1. Patient with long-standing, uncontrolled diabetes, on basal-bolus insulin regimen, now with decreased metformin dose, and without any mealtime insulin after changes made in the hospital. Sugars are markedly worse. - Will advise her to go back to the previous regimen of insulin (toujeo is too expensive, we can continue with Lantus). She absolutely needs to add back the  mealtime insulin and will stop the sliding scale for now to simplify the regimen. We can continue with the metformin half maximal dose, until I obtain more information about her recent kidney function. - I advised her to:  Patient Instructions  Please change the regimen as follows: - Metformin 500 mg 2x a day - Toujeo or Lantus 36 units in am - Humalog: - Breakfast: 16-18 units  - Lunch: 20 units  - Dinner: 16-18 units  Please come back for a follow-up appointment in 1.5 months.  - continue checking sugars at different times of the day - check 2-3 times a day, rotating checks - advised for yearly eye exams - she is UTD - will need to get BMP and Lipid values from PCP - will check HbA1c today >> 8.8% (higher) - Return to clinic in 1.5 mo   Philemon Kingdom, MD PhD [facility]

## 2016-01-01 ENCOUNTER — Other Ambulatory Visit: Payer: Self-pay

## 2016-01-01 DIAGNOSIS — E119 Type 2 diabetes mellitus without complications: Secondary | ICD-10-CM | POA: Diagnosis not present

## 2016-01-01 DIAGNOSIS — F329 Major depressive disorder, single episode, unspecified: Secondary | ICD-10-CM | POA: Diagnosis not present

## 2016-01-01 DIAGNOSIS — I10 Essential (primary) hypertension: Secondary | ICD-10-CM | POA: Diagnosis not present

## 2016-01-01 DIAGNOSIS — M6281 Muscle weakness (generalized): Secondary | ICD-10-CM | POA: Diagnosis not present

## 2016-01-01 DIAGNOSIS — M1990 Unspecified osteoarthritis, unspecified site: Secondary | ICD-10-CM | POA: Diagnosis not present

## 2016-01-01 DIAGNOSIS — I82401 Acute embolism and thrombosis of unspecified deep veins of right lower extremity: Secondary | ICD-10-CM | POA: Diagnosis not present

## 2016-01-03 DIAGNOSIS — M6281 Muscle weakness (generalized): Secondary | ICD-10-CM | POA: Diagnosis not present

## 2016-01-03 DIAGNOSIS — I82401 Acute embolism and thrombosis of unspecified deep veins of right lower extremity: Secondary | ICD-10-CM | POA: Diagnosis not present

## 2016-01-03 DIAGNOSIS — I10 Essential (primary) hypertension: Secondary | ICD-10-CM | POA: Diagnosis not present

## 2016-01-03 DIAGNOSIS — E119 Type 2 diabetes mellitus without complications: Secondary | ICD-10-CM | POA: Diagnosis not present

## 2016-01-03 DIAGNOSIS — F329 Major depressive disorder, single episode, unspecified: Secondary | ICD-10-CM | POA: Diagnosis not present

## 2016-01-03 DIAGNOSIS — M1990 Unspecified osteoarthritis, unspecified site: Secondary | ICD-10-CM | POA: Diagnosis not present

## 2016-01-04 DIAGNOSIS — I10 Essential (primary) hypertension: Secondary | ICD-10-CM | POA: Diagnosis not present

## 2016-01-04 DIAGNOSIS — M6281 Muscle weakness (generalized): Secondary | ICD-10-CM | POA: Diagnosis not present

## 2016-01-04 DIAGNOSIS — E119 Type 2 diabetes mellitus without complications: Secondary | ICD-10-CM | POA: Diagnosis not present

## 2016-01-04 DIAGNOSIS — F329 Major depressive disorder, single episode, unspecified: Secondary | ICD-10-CM | POA: Diagnosis not present

## 2016-01-04 DIAGNOSIS — M1990 Unspecified osteoarthritis, unspecified site: Secondary | ICD-10-CM | POA: Diagnosis not present

## 2016-01-04 DIAGNOSIS — I82401 Acute embolism and thrombosis of unspecified deep veins of right lower extremity: Secondary | ICD-10-CM | POA: Diagnosis not present

## 2016-01-08 ENCOUNTER — Other Ambulatory Visit: Payer: Self-pay

## 2016-01-08 ENCOUNTER — Telehealth: Payer: Self-pay | Admitting: Internal Medicine

## 2016-01-08 MED ORDER — INSULIN LISPRO 100 UNIT/ML (KWIKPEN): 16.0000 [IU] | PEN_INJECTOR | Freq: Three times a day (TID) | SUBCUTANEOUS | 3 refills | Status: DC

## 2016-01-08 NOTE — Telephone Encounter (Signed)
Pt is asking for [place] to make [facility] primary for her insulin rxs but we can use [facility] for other things.  Please call in the humalog rx 90 day supply

## 2016-01-08 NOTE — Telephone Encounter (Signed)
PT said she has received Toujeo and Metformin but has not received the Humulog and she is almost out, would like to know what is going on.

## 2016-01-08 NOTE — Telephone Encounter (Signed)
Resubmitted patient humalog to pharmacy.

## 2016-01-08 NOTE — Telephone Encounter (Signed)
Primary pharmacy added as [facility].

## 2016-01-09 ENCOUNTER — Other Ambulatory Visit: Payer: Self-pay

## 2016-01-09 DIAGNOSIS — F329 Major depressive disorder, single episode, unspecified: Secondary | ICD-10-CM | POA: Diagnosis not present

## 2016-01-09 DIAGNOSIS — I82401 Acute embolism and thrombosis of unspecified deep veins of right lower extremity: Secondary | ICD-10-CM | POA: Diagnosis not present

## 2016-01-09 DIAGNOSIS — I10 Essential (primary) hypertension: Secondary | ICD-10-CM | POA: Diagnosis not present

## 2016-01-09 DIAGNOSIS — M6281 Muscle weakness (generalized): Secondary | ICD-10-CM | POA: Diagnosis not present

## 2016-01-09 DIAGNOSIS — E119 Type 2 diabetes mellitus without complications: Secondary | ICD-10-CM | POA: Diagnosis not present

## 2016-01-09 DIAGNOSIS — M1990 Unspecified osteoarthritis, unspecified site: Secondary | ICD-10-CM | POA: Diagnosis not present

## 2016-01-11 DIAGNOSIS — E119 Type 2 diabetes mellitus without complications: Secondary | ICD-10-CM | POA: Diagnosis not present

## 2016-01-11 DIAGNOSIS — I10 Essential (primary) hypertension: Secondary | ICD-10-CM | POA: Diagnosis not present

## 2016-01-11 DIAGNOSIS — M1990 Unspecified osteoarthritis, unspecified site: Secondary | ICD-10-CM | POA: Diagnosis not present

## 2016-01-11 DIAGNOSIS — M6281 Muscle weakness (generalized): Secondary | ICD-10-CM | POA: Diagnosis not present

## 2016-01-11 DIAGNOSIS — I82401 Acute embolism and thrombosis of unspecified deep veins of right lower extremity: Secondary | ICD-10-CM | POA: Diagnosis not present

## 2016-01-11 DIAGNOSIS — F329 Major depressive disorder, single episode, unspecified: Secondary | ICD-10-CM | POA: Diagnosis not present

## 2016-01-15 DIAGNOSIS — M1990 Unspecified osteoarthritis, unspecified site: Secondary | ICD-10-CM | POA: Diagnosis not present

## 2016-01-15 DIAGNOSIS — M6281 Muscle weakness (generalized): Secondary | ICD-10-CM | POA: Diagnosis not present

## 2016-01-15 DIAGNOSIS — F329 Major depressive disorder, single episode, unspecified: Secondary | ICD-10-CM | POA: Diagnosis not present

## 2016-01-15 DIAGNOSIS — E119 Type 2 diabetes mellitus without complications: Secondary | ICD-10-CM | POA: Diagnosis not present

## 2016-01-15 DIAGNOSIS — I82401 Acute embolism and thrombosis of unspecified deep veins of right lower extremity: Secondary | ICD-10-CM | POA: Diagnosis not present

## 2016-01-15 DIAGNOSIS — I10 Essential (primary) hypertension: Secondary | ICD-10-CM | POA: Diagnosis not present

## 2016-01-30 DIAGNOSIS — F329 Major depressive disorder, single episode, unspecified: Secondary | ICD-10-CM | POA: Diagnosis not present

## 2016-01-30 DIAGNOSIS — I82401 Acute embolism and thrombosis of unspecified deep veins of right lower extremity: Secondary | ICD-10-CM | POA: Diagnosis not present

## 2016-01-30 DIAGNOSIS — M1990 Unspecified osteoarthritis, unspecified site: Secondary | ICD-10-CM | POA: Diagnosis not present

## 2016-01-30 DIAGNOSIS — I10 Essential (primary) hypertension: Secondary | ICD-10-CM | POA: Diagnosis not present

## 2016-01-30 DIAGNOSIS — E119 Type 2 diabetes mellitus without complications: Secondary | ICD-10-CM | POA: Diagnosis not present

## 2016-01-30 DIAGNOSIS — M6281 Muscle weakness (generalized): Secondary | ICD-10-CM | POA: Diagnosis not present

## 2016-02-08 DIAGNOSIS — I10 Essential (primary) hypertension: Secondary | ICD-10-CM | POA: Diagnosis not present

## 2016-02-08 DIAGNOSIS — E119 Type 2 diabetes mellitus without complications: Secondary | ICD-10-CM | POA: Diagnosis not present

## 2016-02-12 DIAGNOSIS — F329 Major depressive disorder, single episode, unspecified: Secondary | ICD-10-CM | POA: Diagnosis not present

## 2016-02-12 DIAGNOSIS — M1990 Unspecified osteoarthritis, unspecified site: Secondary | ICD-10-CM | POA: Diagnosis not present

## 2016-02-12 DIAGNOSIS — E119 Type 2 diabetes mellitus without complications: Secondary | ICD-10-CM | POA: Diagnosis not present

## 2016-02-12 DIAGNOSIS — M6281 Muscle weakness (generalized): Secondary | ICD-10-CM | POA: Diagnosis not present

## 2016-02-12 DIAGNOSIS — I10 Essential (primary) hypertension: Secondary | ICD-10-CM | POA: Diagnosis not present

## 2016-02-12 DIAGNOSIS — I82401 Acute embolism and thrombosis of unspecified deep veins of right lower extremity: Secondary | ICD-10-CM | POA: Diagnosis not present

## 2016-02-13 DIAGNOSIS — I82401 Acute embolism and thrombosis of unspecified deep veins of right lower extremity: Secondary | ICD-10-CM | POA: Diagnosis not present

## 2016-02-13 DIAGNOSIS — E119 Type 2 diabetes mellitus without complications: Secondary | ICD-10-CM | POA: Diagnosis not present

## 2016-02-13 DIAGNOSIS — F329 Major depressive disorder, single episode, unspecified: Secondary | ICD-10-CM | POA: Diagnosis not present

## 2016-02-13 DIAGNOSIS — M1990 Unspecified osteoarthritis, unspecified site: Secondary | ICD-10-CM | POA: Diagnosis not present

## 2016-02-13 DIAGNOSIS — M6281 Muscle weakness (generalized): Secondary | ICD-10-CM | POA: Diagnosis not present

## 2016-02-13 DIAGNOSIS — I10 Essential (primary) hypertension: Secondary | ICD-10-CM | POA: Diagnosis not present

## 2016-02-14 DIAGNOSIS — M1990 Unspecified osteoarthritis, unspecified site: Secondary | ICD-10-CM | POA: Diagnosis not present

## 2016-02-14 DIAGNOSIS — M6281 Muscle weakness (generalized): Secondary | ICD-10-CM | POA: Diagnosis not present

## 2016-02-14 DIAGNOSIS — I10 Essential (primary) hypertension: Secondary | ICD-10-CM | POA: Diagnosis not present

## 2016-02-14 DIAGNOSIS — Z794 Long term (current) use of insulin: Secondary | ICD-10-CM | POA: Diagnosis not present

## 2016-02-14 DIAGNOSIS — E119 Type 2 diabetes mellitus without complications: Secondary | ICD-10-CM | POA: Diagnosis not present

## 2016-02-14 DIAGNOSIS — Z5181 Encounter for therapeutic drug level monitoring: Secondary | ICD-10-CM | POA: Diagnosis not present

## 2016-02-14 DIAGNOSIS — Z7984 Long term (current) use of oral hypoglycemic drugs: Secondary | ICD-10-CM | POA: Diagnosis not present

## 2016-02-14 DIAGNOSIS — I82401 Acute embolism and thrombosis of unspecified deep veins of right lower extremity: Secondary | ICD-10-CM | POA: Diagnosis not present

## 2016-02-14 DIAGNOSIS — F329 Major depressive disorder, single episode, unspecified: Secondary | ICD-10-CM | POA: Diagnosis not present

## 2016-02-15 DIAGNOSIS — I82401 Acute embolism and thrombosis of unspecified deep veins of right lower extremity: Secondary | ICD-10-CM | POA: Diagnosis not present

## 2016-02-15 DIAGNOSIS — M6281 Muscle weakness (generalized): Secondary | ICD-10-CM | POA: Diagnosis not present

## 2016-02-15 DIAGNOSIS — I10 Essential (primary) hypertension: Secondary | ICD-10-CM | POA: Diagnosis not present

## 2016-02-15 DIAGNOSIS — M1990 Unspecified osteoarthritis, unspecified site: Secondary | ICD-10-CM | POA: Diagnosis not present

## 2016-02-15 DIAGNOSIS — E119 Type 2 diabetes mellitus without complications: Secondary | ICD-10-CM | POA: Diagnosis not present

## 2016-02-15 DIAGNOSIS — F329 Major depressive disorder, single episode, unspecified: Secondary | ICD-10-CM | POA: Diagnosis not present

## 2016-02-16 DIAGNOSIS — E1142 Type 2 diabetes mellitus with diabetic polyneuropathy: Secondary | ICD-10-CM | POA: Diagnosis not present

## 2016-02-16 DIAGNOSIS — R569 Unspecified convulsions: Secondary | ICD-10-CM | POA: Diagnosis not present

## 2016-02-19 DIAGNOSIS — E1140 Type 2 diabetes mellitus with diabetic neuropathy, unspecified: Secondary | ICD-10-CM | POA: Diagnosis not present

## 2016-02-19 DIAGNOSIS — I82401 Acute embolism and thrombosis of unspecified deep veins of right lower extremity: Secondary | ICD-10-CM | POA: Diagnosis not present

## 2016-02-19 DIAGNOSIS — E7800 Pure hypercholesterolemia, unspecified: Secondary | ICD-10-CM | POA: Diagnosis not present

## 2016-02-19 DIAGNOSIS — Z79899 Other long term (current) drug therapy: Secondary | ICD-10-CM | POA: Diagnosis not present

## 2016-02-19 DIAGNOSIS — Z7189 Other specified counseling: Secondary | ICD-10-CM | POA: Diagnosis not present

## 2016-02-19 DIAGNOSIS — Z713 Dietary counseling and surveillance: Secondary | ICD-10-CM | POA: Diagnosis not present

## 2016-02-19 DIAGNOSIS — E119 Type 2 diabetes mellitus without complications: Secondary | ICD-10-CM | POA: Diagnosis not present

## 2016-02-19 DIAGNOSIS — I10 Essential (primary) hypertension: Secondary | ICD-10-CM | POA: Diagnosis not present

## 2016-02-19 DIAGNOSIS — M545 Low back pain: Secondary | ICD-10-CM | POA: Diagnosis not present

## 2016-02-19 DIAGNOSIS — M6281 Muscle weakness (generalized): Secondary | ICD-10-CM | POA: Diagnosis not present

## 2016-02-19 DIAGNOSIS — Z299 Encounter for prophylactic measures, unspecified: Secondary | ICD-10-CM | POA: Diagnosis not present

## 2016-02-19 DIAGNOSIS — F329 Major depressive disorder, single episode, unspecified: Secondary | ICD-10-CM | POA: Diagnosis not present

## 2016-02-19 DIAGNOSIS — Z1389 Encounter for screening for other disorder: Secondary | ICD-10-CM | POA: Diagnosis not present

## 2016-02-19 DIAGNOSIS — Z1211 Encounter for screening for malignant neoplasm of colon: Secondary | ICD-10-CM | POA: Diagnosis not present

## 2016-02-19 DIAGNOSIS — M1990 Unspecified osteoarthritis, unspecified site: Secondary | ICD-10-CM | POA: Diagnosis not present

## 2016-02-19 DIAGNOSIS — R5383 Other fatigue: Secondary | ICD-10-CM | POA: Diagnosis not present

## 2016-02-19 DIAGNOSIS — Z0000 Encounter for general adult medical examination without abnormal findings: Secondary | ICD-10-CM | POA: Diagnosis not present

## 2016-02-20 DIAGNOSIS — E119 Type 2 diabetes mellitus without complications: Secondary | ICD-10-CM | POA: Diagnosis not present

## 2016-02-20 DIAGNOSIS — I10 Essential (primary) hypertension: Secondary | ICD-10-CM | POA: Diagnosis not present

## 2016-02-20 DIAGNOSIS — I82401 Acute embolism and thrombosis of unspecified deep veins of right lower extremity: Secondary | ICD-10-CM | POA: Diagnosis not present

## 2016-02-20 DIAGNOSIS — F329 Major depressive disorder, single episode, unspecified: Secondary | ICD-10-CM | POA: Diagnosis not present

## 2016-02-20 DIAGNOSIS — M6281 Muscle weakness (generalized): Secondary | ICD-10-CM | POA: Diagnosis not present

## 2016-02-20 DIAGNOSIS — M1990 Unspecified osteoarthritis, unspecified site: Secondary | ICD-10-CM | POA: Diagnosis not present

## 2016-02-22 DIAGNOSIS — I82401 Acute embolism and thrombosis of unspecified deep veins of right lower extremity: Secondary | ICD-10-CM | POA: Diagnosis not present

## 2016-02-22 DIAGNOSIS — E119 Type 2 diabetes mellitus without complications: Secondary | ICD-10-CM | POA: Diagnosis not present

## 2016-02-22 DIAGNOSIS — M6281 Muscle weakness (generalized): Secondary | ICD-10-CM | POA: Diagnosis not present

## 2016-02-22 DIAGNOSIS — M1990 Unspecified osteoarthritis, unspecified site: Secondary | ICD-10-CM | POA: Diagnosis not present

## 2016-02-22 DIAGNOSIS — F329 Major depressive disorder, single episode, unspecified: Secondary | ICD-10-CM | POA: Diagnosis not present

## 2016-02-22 DIAGNOSIS — I10 Essential (primary) hypertension: Secondary | ICD-10-CM | POA: Diagnosis not present

## 2016-02-26 ENCOUNTER — Encounter: Payer: Self-pay | Admitting: Internal Medicine

## 2016-02-26 ENCOUNTER — Ambulatory Visit (INDEPENDENT_AMBULATORY_CARE_PROVIDER_SITE_OTHER): Payer: Medicare Other | Admitting: Internal Medicine

## 2016-02-26 VITALS — BP 122/82 | HR 85 | Wt 174.0 lb

## 2016-02-26 DIAGNOSIS — I82401 Acute embolism and thrombosis of unspecified deep veins of right lower extremity: Secondary | ICD-10-CM | POA: Diagnosis not present

## 2016-02-26 DIAGNOSIS — IMO0002 Reserved for concepts with insufficient information to code with codable children: Secondary | ICD-10-CM

## 2016-02-26 DIAGNOSIS — E1165 Type 2 diabetes mellitus with hyperglycemia: Secondary | ICD-10-CM | POA: Diagnosis not present

## 2016-02-26 DIAGNOSIS — M1990 Unspecified osteoarthritis, unspecified site: Secondary | ICD-10-CM | POA: Diagnosis not present

## 2016-02-26 DIAGNOSIS — M6281 Muscle weakness (generalized): Secondary | ICD-10-CM | POA: Diagnosis not present

## 2016-02-26 DIAGNOSIS — F329 Major depressive disorder, single episode, unspecified: Secondary | ICD-10-CM | POA: Diagnosis not present

## 2016-02-26 DIAGNOSIS — I10 Essential (primary) hypertension: Secondary | ICD-10-CM | POA: Diagnosis not present

## 2016-02-26 DIAGNOSIS — Z23 Encounter for immunization: Secondary | ICD-10-CM

## 2016-02-26 DIAGNOSIS — Z794 Long term (current) use of insulin: Secondary | ICD-10-CM | POA: Diagnosis not present

## 2016-02-26 DIAGNOSIS — E119 Type 2 diabetes mellitus without complications: Secondary | ICD-10-CM | POA: Diagnosis not present

## 2016-02-26 DIAGNOSIS — E118 Type 2 diabetes mellitus with unspecified complications: Secondary | ICD-10-CM | POA: Diagnosis not present

## 2016-02-26 MED ORDER — INSULIN GLARGINE 300 UNIT/ML ~~LOC~~ SOPN: 38.0000 [IU] | PEN_INJECTOR | SUBCUTANEOUS | 3 refills | Status: DC

## 2016-02-26 NOTE — Progress Notes (Signed)
Patient ID: Sandra [name], female   DOB: January 07, 1941, 75 y.o.   MRN: [id number]  HPI: Sandra [name] is a 75 y.o.-year-old female, initially referred by her PCP, Dr. Woody Seller, returning for f/u for DM2, dx in 2006, insulin-dependent since 2008, uncontrolled, with complications (PN, cerebro-vascular ds - h/o TIA). Last visit was 4 mo ago.  She had a recent DVT >> was hospitalized for 6 weeks.  Last hemoglobin A1c was: Lab Results  Component Value Date   HGBA1C 8.40f 12/29/2015   HGBA1C 8.3 05/18/2015   HGBA1C 8.6 11/15/2014  04/13/2014: HbA1c 9.4%  Pt is on a regimen of: - Metformin 1000 mg in am and 500 mg in pm - Toujeo 40 units in am - Novolog 16-20 units 3x a day We stopped Glimepiride 4 mg bid  Pt checks her sugars 2x a day and they are: - am (9 am - 12 pm): 858x2, 79-246, 342 >> 91-102 >> 98-174, 194 >> 139, 174-300 - 2h after b'fast: n/c  - before lunch: n/c >> 138, 287 >> n/c >> 270-309 >> 67, 160-215 - 2h after lunch: n/c >> 90x1 >> n/c  - before dinner: n/c >> 177-235 >> 212-296 >> 178-412, 500 >> n/c - 2h after dinner: 400-600 >> 300s (?) >> 280, 318 >> n/c >> 214-509 >> 198-362 - bedtime: 323, 344 >> n/c >> 106, 188-280, 362 >>  see above - nighttime: n/c >> 98 >> n/c + lows- mostly in the second part of the night and a.m. Lowest sugar was 70s (am) >> 91 >> 98 >> 67 x1; she has hypoglycemia awareness at 90s Highest sugar was 300s >> 500s >> 300s. She became confused with high sugars before >> did not drive since then.   Glucometer: OneTouch Verio  [name] meals are: - Breakfast: cereal, eggs + bacon, PB cracker - Lunch: sandwich, veggies + meat - Dinner: sandwich, veggies + meat - Snacks: 3-4  - no CKD, last BUN/creatinine:  15/0.75 on 05/07/2013 Lab Results  Component Value Date   BUN 10 04/28/2011   CREATININE 0.49 (L) 04/28/2011   - last set of lipids: No results found for: CHOL, HDL, LDLCALC, LDLDIRECT, TRIG, CHOLHDL - last eye exam was in 10/2014  (cannot  remember the exact date). No DR.  - + numbness; no tingling in her feet.  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   ROS: Constitutional: no weight gain/loss, no fatigue, nosubjective hyperthermia/hypothermia, + nocturia Eyes: + blurry vision, no xerophthalmia ENT: no sore throat, no nodules palpated in throat, no dysphagia/odynophagia, no hoarseness, + decreased hearing and tinnitus Cardiovascular: no CP/SOB/palpitations/+ leg swelling Respiratory: no cough/SOB Gastrointestinal: no N/V/D/C Musculoskeletal: no muscle/joint aches Skin: no rashes Neurological: + tremors/no numbness/tingling/dizziness  I reviewed pt's medications, allergies, PMH, social hx, family hx, and changes were documented in the history of present illness.  Otherwise, unchanged from my initial visit note:  Past Medical History:  Diagnosis Date  . Arthritis   . Diabetes mellitus   . DVT (deep venous thrombosis), left 04/2011   after an ankle fracture.   . Hypertension   . Mitral regurgitation 07/2011   moderate  . Pulmonary embolism ([place]) 04/2011   Past Surgical History:  Procedure Laterality Date  . ABDOMINAL HYSTERECTOMY    . CATARACT EXTRACTION    . COLONOSCOPY  05/07/2012   Procedure: COLONOSCOPY;  Surgeon: Rogene Houston, MD;  Location[facility];  Service: Endoscopy;  Laterality:  N/A;  245  . FOOT SURGERY    . HERNIA REPAIR    . TONSILLECTOMY     History   Social History  . Marital Status: Single    Spouse Name: N/A    Number of Children: 2   Occupational History  .  retired    Social History Main Topics  . Smoking status: Never Smoker   . Smokeless tobacco: Never Used  . Alcohol Use: No  . Drug Use: No   Current Outpatient Prescriptions on File Prior to Visit  Medication Sig Dispense Refill  . amLODipine (NORVASC) 5 MG tablet Take 5 mg by mouth daily.     . Calcium Carbonate-Vit D-Min (CALCIUM 1200 PO) Take 1 capsule by mouth daily.      . Cholecalciferol (VITAMIN D-3) 1000 UNITS CAPS Take 1,000 Units by mouth daily.    . hydrochlorothiazide (HYDRODIURIL) 12.5 MG tablet Take 12.5 mg by mouth daily.     . Insulin Glargine (TOUJEO SOLOSTAR) 300 UNIT/ML SOPN Inject 36 Units into the skin every morning. 9 pen 3  . insulin lispro (HUMALOG KWIKPEN) 100 UNIT/ML KiwkPen Inject 0.16-0.2 mLs (16-20 Units total) into the skin 3 (three) times daily. 45 mL 3  . metFORMIN (GLUCOPHAGE) 500 MG tablet Take 1 tablet (500 mg total) by mouth 2 (two) times daily with a meal. Patient takes 2 in the morning and 1 at night 180 tablet 3  . metoprolol succinate (TOPROL-XL) 50 MG 24 hr tablet     . pantoprazole (PROTONIX) 40 MG tablet TAKE ONE TABLET DAILY 30 tablet 3  . XARELTO 20 MG TABS tablet     . metoprolol succinate (TOPROL-XL) 50 MG 24 hr tablet Take 1 tablet (50 mg total) by mouth daily. (Patient taking differently: Take 150 mg by mouth daily. ) 30 tablet 6   No current facility-administered medications on file prior to visit.    Allergies  Allergen Reactions  . Buprenex [Buprenorphine] Nausea And Vomiting  . Namenda [Memantine Hcl] Other (See Comments)    Sores in the mouth  . Other Other (See Comments)    Shot for pain caused severe nausea. Had to spend the night in the hospital. Shot to numb mouth at the dentist = made her feel like she was smothering.    Family History  Problem Relation Age of Onset  . Hypertension Mother   . Diabetes Mother   . Heart attack Mother    PE: BP 122/82 (BP Location: Left Arm, Patient Position: Sitting)   Pulse 85   Wt 174 lb (78.9 kg)   SpO2 98%   BMI 32.88 kg/m  Body mass index is 32.88 kg/m. Wt Readings from Last 3 Encounters:  02/26/16 174 lb (78.9 kg)  12/29/15 171 lb (77.6 kg)  06/27/15 162 lb 3.2 oz (73.6 kg)   Constitutional: overweight, in NAD, walks with a walker. Eyes: PERRLA, EOMI, no exophthalmos ENT: moist mucous membranes, no thyromegaly, no cervical  lymphadenopathy Cardiovascular: tachycardia, RR, No MRG, + Bilateral lower extremity edema Respiratory: CTA B Gastrointestinal: abdomen soft, NT, ND, BS+ Musculoskeletal: + Heberden and Bouchard's nodules in fingers, strength intact in all 4 Skin: moist, warm, + stasis dermatitis bilaterally Neurological: Mild tremor with outstretched hands, DTR normal in all 4  ASSESSMENT: 1. DM2, insulin-dependent, uncontrolled, with complications - Peripheral neuropathy - Cerebrovascular disease, history of TIA  PLAN:  1. Patient with long-standing, uncontrolled diabetes, on basal-bolus insulin regimen, now with decreased metformin dose. Sugars are still very variable, maybe slightly  better. Will try to increase Levemir a little. - last HbA1c 8.8% (higher) - I advised her to:  Patient Instructions  Please continue: - Metformin 500 mg 2x a day - Humalog: - Breakfast: 16-18 units  - Lunch: 20 units  - Dinner: 16-18 units  Try to increase the Toujeo: - Toujeo 38-40 units in am  Please come back for a follow-up appointment in 3 months.  - continue checking sugars at different times of the day - check 2-3 times a day, rotating checks - advised for yearly eye exams - she is UTD - given flu shot today - will need to get BMP and Lipid values from PCP - will check HbA1c today >> 8.8% (higher) - Return to clinic in 3 mo   Philemon Kingdom, MD PhD [facility]

## 2016-02-26 NOTE — Patient Instructions (Addendum)
Please continue: - Metformin 500 mg 2x a day - Humalog: - Breakfast: 16-18 units  - Lunch: 20 units  - Dinner: 16-18 units  Try to increase the Toujeo: - Toujeo 38-40 units in am  Please come back for a follow-up appointment in 3 months.

## 2016-02-27 DIAGNOSIS — M6281 Muscle weakness (generalized): Secondary | ICD-10-CM | POA: Diagnosis not present

## 2016-02-27 DIAGNOSIS — F329 Major depressive disorder, single episode, unspecified: Secondary | ICD-10-CM | POA: Diagnosis not present

## 2016-02-27 DIAGNOSIS — I10 Essential (primary) hypertension: Secondary | ICD-10-CM | POA: Diagnosis not present

## 2016-02-27 DIAGNOSIS — E119 Type 2 diabetes mellitus without complications: Secondary | ICD-10-CM | POA: Diagnosis not present

## 2016-02-27 DIAGNOSIS — M1990 Unspecified osteoarthritis, unspecified site: Secondary | ICD-10-CM | POA: Diagnosis not present

## 2016-02-27 DIAGNOSIS — I82401 Acute embolism and thrombosis of unspecified deep veins of right lower extremity: Secondary | ICD-10-CM | POA: Diagnosis not present

## 2016-02-29 DIAGNOSIS — I10 Essential (primary) hypertension: Secondary | ICD-10-CM | POA: Diagnosis not present

## 2016-02-29 DIAGNOSIS — M6281 Muscle weakness (generalized): Secondary | ICD-10-CM | POA: Diagnosis not present

## 2016-02-29 DIAGNOSIS — F329 Major depressive disorder, single episode, unspecified: Secondary | ICD-10-CM | POA: Diagnosis not present

## 2016-02-29 DIAGNOSIS — M1990 Unspecified osteoarthritis, unspecified site: Secondary | ICD-10-CM | POA: Diagnosis not present

## 2016-02-29 DIAGNOSIS — E119 Type 2 diabetes mellitus without complications: Secondary | ICD-10-CM | POA: Diagnosis not present

## 2016-02-29 DIAGNOSIS — I82401 Acute embolism and thrombosis of unspecified deep veins of right lower extremity: Secondary | ICD-10-CM | POA: Diagnosis not present

## 2016-03-04 DIAGNOSIS — F329 Major depressive disorder, single episode, unspecified: Secondary | ICD-10-CM | POA: Diagnosis not present

## 2016-03-04 DIAGNOSIS — I10 Essential (primary) hypertension: Secondary | ICD-10-CM | POA: Diagnosis not present

## 2016-03-04 DIAGNOSIS — I82401 Acute embolism and thrombosis of unspecified deep veins of right lower extremity: Secondary | ICD-10-CM | POA: Diagnosis not present

## 2016-03-04 DIAGNOSIS — M6281 Muscle weakness (generalized): Secondary | ICD-10-CM | POA: Diagnosis not present

## 2016-03-04 DIAGNOSIS — M1990 Unspecified osteoarthritis, unspecified site: Secondary | ICD-10-CM | POA: Diagnosis not present

## 2016-03-04 DIAGNOSIS — E119 Type 2 diabetes mellitus without complications: Secondary | ICD-10-CM | POA: Diagnosis not present

## 2016-03-07 DIAGNOSIS — F329 Major depressive disorder, single episode, unspecified: Secondary | ICD-10-CM | POA: Diagnosis not present

## 2016-03-07 DIAGNOSIS — E119 Type 2 diabetes mellitus without complications: Secondary | ICD-10-CM | POA: Diagnosis not present

## 2016-03-07 DIAGNOSIS — I10 Essential (primary) hypertension: Secondary | ICD-10-CM | POA: Diagnosis not present

## 2016-03-07 DIAGNOSIS — I82401 Acute embolism and thrombosis of unspecified deep veins of right lower extremity: Secondary | ICD-10-CM | POA: Diagnosis not present

## 2016-03-07 DIAGNOSIS — M1990 Unspecified osteoarthritis, unspecified site: Secondary | ICD-10-CM | POA: Diagnosis not present

## 2016-03-07 DIAGNOSIS — E2839 Other primary ovarian failure: Secondary | ICD-10-CM | POA: Diagnosis not present

## 2016-03-07 DIAGNOSIS — M6281 Muscle weakness (generalized): Secondary | ICD-10-CM | POA: Diagnosis not present

## 2016-03-18 DIAGNOSIS — E119 Type 2 diabetes mellitus without complications: Secondary | ICD-10-CM | POA: Diagnosis not present

## 2016-03-18 DIAGNOSIS — M6281 Muscle weakness (generalized): Secondary | ICD-10-CM | POA: Diagnosis not present

## 2016-03-18 DIAGNOSIS — I10 Essential (primary) hypertension: Secondary | ICD-10-CM | POA: Diagnosis not present

## 2016-03-18 DIAGNOSIS — M1990 Unspecified osteoarthritis, unspecified site: Secondary | ICD-10-CM | POA: Diagnosis not present

## 2016-03-18 DIAGNOSIS — I82401 Acute embolism and thrombosis of unspecified deep veins of right lower extremity: Secondary | ICD-10-CM | POA: Diagnosis not present

## 2016-03-18 DIAGNOSIS — F329 Major depressive disorder, single episode, unspecified: Secondary | ICD-10-CM | POA: Diagnosis not present

## 2016-03-26 DIAGNOSIS — R569 Unspecified convulsions: Secondary | ICD-10-CM | POA: Diagnosis not present

## 2016-03-26 DIAGNOSIS — E1142 Type 2 diabetes mellitus with diabetic polyneuropathy: Secondary | ICD-10-CM | POA: Diagnosis not present

## 2016-03-26 DIAGNOSIS — G43919 Migraine, unspecified, intractable, without status migrainosus: Secondary | ICD-10-CM | POA: Diagnosis not present

## 2016-04-01 DIAGNOSIS — F329 Major depressive disorder, single episode, unspecified: Secondary | ICD-10-CM | POA: Diagnosis not present

## 2016-04-01 DIAGNOSIS — E119 Type 2 diabetes mellitus without complications: Secondary | ICD-10-CM | POA: Diagnosis not present

## 2016-04-01 DIAGNOSIS — I82401 Acute embolism and thrombosis of unspecified deep veins of right lower extremity: Secondary | ICD-10-CM | POA: Diagnosis not present

## 2016-04-01 DIAGNOSIS — M6281 Muscle weakness (generalized): Secondary | ICD-10-CM | POA: Diagnosis not present

## 2016-04-01 DIAGNOSIS — I10 Essential (primary) hypertension: Secondary | ICD-10-CM | POA: Diagnosis not present

## 2016-04-01 DIAGNOSIS — M1990 Unspecified osteoarthritis, unspecified site: Secondary | ICD-10-CM | POA: Diagnosis not present

## 2016-04-09 DIAGNOSIS — F329 Major depressive disorder, single episode, unspecified: Secondary | ICD-10-CM | POA: Diagnosis not present

## 2016-04-09 DIAGNOSIS — E119 Type 2 diabetes mellitus without complications: Secondary | ICD-10-CM | POA: Diagnosis not present

## 2016-04-09 DIAGNOSIS — M1990 Unspecified osteoarthritis, unspecified site: Secondary | ICD-10-CM | POA: Diagnosis not present

## 2016-04-09 DIAGNOSIS — I10 Essential (primary) hypertension: Secondary | ICD-10-CM | POA: Diagnosis not present

## 2016-04-09 DIAGNOSIS — I82401 Acute embolism and thrombosis of unspecified deep veins of right lower extremity: Secondary | ICD-10-CM | POA: Diagnosis not present

## 2016-04-09 DIAGNOSIS — M6281 Muscle weakness (generalized): Secondary | ICD-10-CM | POA: Diagnosis not present

## 2016-04-10 DIAGNOSIS — Z961 Presence of intraocular lens: Secondary | ICD-10-CM | POA: Diagnosis not present

## 2016-04-16 DIAGNOSIS — E119 Type 2 diabetes mellitus without complications: Secondary | ICD-10-CM | POA: Diagnosis not present

## 2016-04-16 DIAGNOSIS — I10 Essential (primary) hypertension: Secondary | ICD-10-CM | POA: Diagnosis not present

## 2016-04-22 DIAGNOSIS — G459 Transient cerebral ischemic attack, unspecified: Secondary | ICD-10-CM

## 2016-04-22 DIAGNOSIS — I639 Cerebral infarction, unspecified: Secondary | ICD-10-CM

## 2016-04-22 HISTORY — DX: Transient cerebral ischemic attack, unspecified: G45.9

## 2016-04-22 HISTORY — DX: Cerebral infarction, unspecified: I63.9

## 2016-05-10 DIAGNOSIS — B309 Viral conjunctivitis, unspecified: Secondary | ICD-10-CM | POA: Diagnosis not present

## 2016-05-31 ENCOUNTER — Ambulatory Visit: Payer: Medicare Other | Admitting: Internal Medicine

## 2016-05-31 ENCOUNTER — Telehealth: Payer: Self-pay | Admitting: Internal Medicine

## 2016-05-31 NOTE — Telephone Encounter (Signed)
No show

## 2016-06-03 DIAGNOSIS — Z794 Long term (current) use of insulin: Secondary | ICD-10-CM | POA: Diagnosis not present

## 2016-06-03 DIAGNOSIS — Z79899 Other long term (current) drug therapy: Secondary | ICD-10-CM | POA: Diagnosis not present

## 2016-06-03 DIAGNOSIS — Z8052 Family history of malignant neoplasm of bladder: Secondary | ICD-10-CM | POA: Diagnosis not present

## 2016-06-03 DIAGNOSIS — R410 Disorientation, unspecified: Secondary | ICD-10-CM | POA: Diagnosis present

## 2016-06-03 DIAGNOSIS — R531 Weakness: Secondary | ICD-10-CM | POA: Diagnosis not present

## 2016-06-03 DIAGNOSIS — Z86711 Personal history of pulmonary embolism: Secondary | ICD-10-CM | POA: Diagnosis not present

## 2016-06-03 DIAGNOSIS — I10 Essential (primary) hypertension: Secondary | ICD-10-CM | POA: Diagnosis present

## 2016-06-03 DIAGNOSIS — R05 Cough: Secondary | ICD-10-CM | POA: Diagnosis not present

## 2016-06-03 DIAGNOSIS — R51 Headache: Secondary | ICD-10-CM | POA: Diagnosis not present

## 2016-06-03 DIAGNOSIS — E1165 Type 2 diabetes mellitus with hyperglycemia: Secondary | ICD-10-CM | POA: Diagnosis not present

## 2016-06-03 DIAGNOSIS — Z800 Family history of malignant neoplasm of digestive organs: Secondary | ICD-10-CM | POA: Diagnosis not present

## 2016-06-03 DIAGNOSIS — R000 Tachycardia, unspecified: Secondary | ICD-10-CM | POA: Diagnosis not present

## 2016-06-03 DIAGNOSIS — I639 Cerebral infarction, unspecified: Secondary | ICD-10-CM | POA: Diagnosis not present

## 2016-06-03 DIAGNOSIS — R4182 Altered mental status, unspecified: Secondary | ICD-10-CM | POA: Diagnosis present

## 2016-06-03 DIAGNOSIS — M1990 Unspecified osteoarthritis, unspecified site: Secondary | ICD-10-CM | POA: Diagnosis present

## 2016-06-03 NOTE — Telephone Encounter (Signed)
No show

## 2016-06-07 DIAGNOSIS — Z794 Long term (current) use of insulin: Secondary | ICD-10-CM | POA: Diagnosis not present

## 2016-06-07 DIAGNOSIS — Z86711 Personal history of pulmonary embolism: Secondary | ICD-10-CM | POA: Diagnosis not present

## 2016-06-07 DIAGNOSIS — H8109 Meniere's disease, unspecified ear: Secondary | ICD-10-CM | POA: Diagnosis not present

## 2016-06-07 DIAGNOSIS — E1165 Type 2 diabetes mellitus with hyperglycemia: Secondary | ICD-10-CM | POA: Diagnosis not present

## 2016-06-07 DIAGNOSIS — I69398 Other sequelae of cerebral infarction: Secondary | ICD-10-CM | POA: Diagnosis not present

## 2016-06-07 DIAGNOSIS — Z7901 Long term (current) use of anticoagulants: Secondary | ICD-10-CM | POA: Diagnosis not present

## 2016-06-07 DIAGNOSIS — I10 Essential (primary) hypertension: Secondary | ICD-10-CM | POA: Diagnosis not present

## 2016-06-07 DIAGNOSIS — Z86718 Personal history of other venous thrombosis and embolism: Secondary | ICD-10-CM | POA: Diagnosis not present

## 2016-06-07 DIAGNOSIS — M1991 Primary osteoarthritis, unspecified site: Secondary | ICD-10-CM | POA: Diagnosis not present

## 2016-06-07 DIAGNOSIS — M6281 Muscle weakness (generalized): Secondary | ICD-10-CM | POA: Diagnosis not present

## 2016-06-10 DIAGNOSIS — I69398 Other sequelae of cerebral infarction: Secondary | ICD-10-CM | POA: Diagnosis not present

## 2016-06-10 DIAGNOSIS — E1165 Type 2 diabetes mellitus with hyperglycemia: Secondary | ICD-10-CM | POA: Diagnosis not present

## 2016-06-10 DIAGNOSIS — M1991 Primary osteoarthritis, unspecified site: Secondary | ICD-10-CM | POA: Diagnosis not present

## 2016-06-10 DIAGNOSIS — M6281 Muscle weakness (generalized): Secondary | ICD-10-CM | POA: Diagnosis not present

## 2016-06-10 DIAGNOSIS — H8109 Meniere's disease, unspecified ear: Secondary | ICD-10-CM | POA: Diagnosis not present

## 2016-06-10 DIAGNOSIS — I10 Essential (primary) hypertension: Secondary | ICD-10-CM | POA: Diagnosis not present

## 2016-06-11 DIAGNOSIS — H8109 Meniere's disease, unspecified ear: Secondary | ICD-10-CM | POA: Diagnosis not present

## 2016-06-11 DIAGNOSIS — M6281 Muscle weakness (generalized): Secondary | ICD-10-CM | POA: Diagnosis not present

## 2016-06-11 DIAGNOSIS — I69398 Other sequelae of cerebral infarction: Secondary | ICD-10-CM | POA: Diagnosis not present

## 2016-06-11 DIAGNOSIS — M1991 Primary osteoarthritis, unspecified site: Secondary | ICD-10-CM | POA: Diagnosis not present

## 2016-06-11 DIAGNOSIS — E1165 Type 2 diabetes mellitus with hyperglycemia: Secondary | ICD-10-CM | POA: Diagnosis not present

## 2016-06-11 DIAGNOSIS — I10 Essential (primary) hypertension: Secondary | ICD-10-CM | POA: Diagnosis not present

## 2016-06-12 DIAGNOSIS — M1991 Primary osteoarthritis, unspecified site: Secondary | ICD-10-CM | POA: Diagnosis not present

## 2016-06-12 DIAGNOSIS — H8109 Meniere's disease, unspecified ear: Secondary | ICD-10-CM | POA: Diagnosis not present

## 2016-06-12 DIAGNOSIS — I10 Essential (primary) hypertension: Secondary | ICD-10-CM | POA: Diagnosis not present

## 2016-06-12 DIAGNOSIS — E1165 Type 2 diabetes mellitus with hyperglycemia: Secondary | ICD-10-CM | POA: Diagnosis not present

## 2016-06-12 DIAGNOSIS — M6281 Muscle weakness (generalized): Secondary | ICD-10-CM | POA: Diagnosis not present

## 2016-06-12 DIAGNOSIS — I69398 Other sequelae of cerebral infarction: Secondary | ICD-10-CM | POA: Diagnosis not present

## 2016-06-13 DIAGNOSIS — I6930 Unspecified sequelae of cerebral infarction: Secondary | ICD-10-CM | POA: Diagnosis not present

## 2016-06-13 DIAGNOSIS — H8109 Meniere's disease, unspecified ear: Secondary | ICD-10-CM | POA: Diagnosis not present

## 2016-06-13 DIAGNOSIS — R569 Unspecified convulsions: Secondary | ICD-10-CM | POA: Diagnosis not present

## 2016-06-13 DIAGNOSIS — G43919 Migraine, unspecified, intractable, without status migrainosus: Secondary | ICD-10-CM | POA: Diagnosis not present

## 2016-06-14 DIAGNOSIS — E1165 Type 2 diabetes mellitus with hyperglycemia: Secondary | ICD-10-CM | POA: Diagnosis not present

## 2016-06-14 DIAGNOSIS — M6281 Muscle weakness (generalized): Secondary | ICD-10-CM | POA: Diagnosis not present

## 2016-06-14 DIAGNOSIS — H8109 Meniere's disease, unspecified ear: Secondary | ICD-10-CM | POA: Diagnosis not present

## 2016-06-14 DIAGNOSIS — I69398 Other sequelae of cerebral infarction: Secondary | ICD-10-CM | POA: Diagnosis not present

## 2016-06-14 DIAGNOSIS — M1991 Primary osteoarthritis, unspecified site: Secondary | ICD-10-CM | POA: Diagnosis not present

## 2016-06-14 DIAGNOSIS — I10 Essential (primary) hypertension: Secondary | ICD-10-CM | POA: Diagnosis not present

## 2016-06-17 DIAGNOSIS — H8109 Meniere's disease, unspecified ear: Secondary | ICD-10-CM | POA: Diagnosis not present

## 2016-06-17 DIAGNOSIS — E1165 Type 2 diabetes mellitus with hyperglycemia: Secondary | ICD-10-CM | POA: Diagnosis not present

## 2016-06-17 DIAGNOSIS — I69398 Other sequelae of cerebral infarction: Secondary | ICD-10-CM | POA: Diagnosis not present

## 2016-06-17 DIAGNOSIS — M1991 Primary osteoarthritis, unspecified site: Secondary | ICD-10-CM | POA: Diagnosis not present

## 2016-06-17 DIAGNOSIS — M6281 Muscle weakness (generalized): Secondary | ICD-10-CM | POA: Diagnosis not present

## 2016-06-17 DIAGNOSIS — I10 Essential (primary) hypertension: Secondary | ICD-10-CM | POA: Diagnosis not present

## 2016-06-18 ENCOUNTER — Ambulatory Visit: Payer: Medicare Other | Admitting: Internal Medicine

## 2016-06-18 DIAGNOSIS — H8109 Meniere's disease, unspecified ear: Secondary | ICD-10-CM | POA: Diagnosis not present

## 2016-06-18 DIAGNOSIS — M1991 Primary osteoarthritis, unspecified site: Secondary | ICD-10-CM | POA: Diagnosis not present

## 2016-06-18 DIAGNOSIS — E1165 Type 2 diabetes mellitus with hyperglycemia: Secondary | ICD-10-CM | POA: Diagnosis not present

## 2016-06-18 DIAGNOSIS — I10 Essential (primary) hypertension: Secondary | ICD-10-CM | POA: Diagnosis not present

## 2016-06-18 DIAGNOSIS — I69398 Other sequelae of cerebral infarction: Secondary | ICD-10-CM | POA: Diagnosis not present

## 2016-06-18 DIAGNOSIS — M6281 Muscle weakness (generalized): Secondary | ICD-10-CM | POA: Diagnosis not present

## 2016-06-19 DIAGNOSIS — I10 Essential (primary) hypertension: Secondary | ICD-10-CM | POA: Diagnosis not present

## 2016-06-19 DIAGNOSIS — E1165 Type 2 diabetes mellitus with hyperglycemia: Secondary | ICD-10-CM | POA: Diagnosis not present

## 2016-06-19 DIAGNOSIS — H8109 Meniere's disease, unspecified ear: Secondary | ICD-10-CM | POA: Diagnosis not present

## 2016-06-19 DIAGNOSIS — M1991 Primary osteoarthritis, unspecified site: Secondary | ICD-10-CM | POA: Diagnosis not present

## 2016-06-19 DIAGNOSIS — I69398 Other sequelae of cerebral infarction: Secondary | ICD-10-CM | POA: Diagnosis not present

## 2016-06-19 DIAGNOSIS — M6281 Muscle weakness (generalized): Secondary | ICD-10-CM | POA: Diagnosis not present

## 2016-06-20 DIAGNOSIS — H8109 Meniere's disease, unspecified ear: Secondary | ICD-10-CM | POA: Diagnosis not present

## 2016-06-20 DIAGNOSIS — Z299 Encounter for prophylactic measures, unspecified: Secondary | ICD-10-CM | POA: Diagnosis not present

## 2016-06-20 DIAGNOSIS — I10 Essential (primary) hypertension: Secondary | ICD-10-CM | POA: Diagnosis not present

## 2016-06-20 DIAGNOSIS — M6281 Muscle weakness (generalized): Secondary | ICD-10-CM | POA: Diagnosis not present

## 2016-06-20 DIAGNOSIS — Z713 Dietary counseling and surveillance: Secondary | ICD-10-CM | POA: Diagnosis not present

## 2016-06-20 DIAGNOSIS — E1165 Type 2 diabetes mellitus with hyperglycemia: Secondary | ICD-10-CM | POA: Diagnosis not present

## 2016-06-20 DIAGNOSIS — G40409 Other generalized epilepsy and epileptic syndromes, not intractable, without status epilepticus: Secondary | ICD-10-CM | POA: Diagnosis not present

## 2016-06-20 DIAGNOSIS — I2699 Other pulmonary embolism without acute cor pulmonale: Secondary | ICD-10-CM | POA: Diagnosis not present

## 2016-06-20 DIAGNOSIS — E1140 Type 2 diabetes mellitus with diabetic neuropathy, unspecified: Secondary | ICD-10-CM | POA: Diagnosis not present

## 2016-06-20 DIAGNOSIS — M1991 Primary osteoarthritis, unspecified site: Secondary | ICD-10-CM | POA: Diagnosis not present

## 2016-06-20 DIAGNOSIS — I69398 Other sequelae of cerebral infarction: Secondary | ICD-10-CM | POA: Diagnosis not present

## 2016-06-20 DIAGNOSIS — Z6828 Body mass index (BMI) 28.0-28.9, adult: Secondary | ICD-10-CM | POA: Diagnosis not present

## 2016-06-21 DIAGNOSIS — I10 Essential (primary) hypertension: Secondary | ICD-10-CM | POA: Diagnosis not present

## 2016-06-21 DIAGNOSIS — M1991 Primary osteoarthritis, unspecified site: Secondary | ICD-10-CM | POA: Diagnosis not present

## 2016-06-21 DIAGNOSIS — I69398 Other sequelae of cerebral infarction: Secondary | ICD-10-CM | POA: Diagnosis not present

## 2016-06-21 DIAGNOSIS — H8109 Meniere's disease, unspecified ear: Secondary | ICD-10-CM | POA: Diagnosis not present

## 2016-06-21 DIAGNOSIS — M6281 Muscle weakness (generalized): Secondary | ICD-10-CM | POA: Diagnosis not present

## 2016-06-21 DIAGNOSIS — E1165 Type 2 diabetes mellitus with hyperglycemia: Secondary | ICD-10-CM | POA: Diagnosis not present

## 2016-06-24 DIAGNOSIS — E1165 Type 2 diabetes mellitus with hyperglycemia: Secondary | ICD-10-CM | POA: Diagnosis not present

## 2016-06-24 DIAGNOSIS — I69398 Other sequelae of cerebral infarction: Secondary | ICD-10-CM | POA: Diagnosis not present

## 2016-06-24 DIAGNOSIS — M6281 Muscle weakness (generalized): Secondary | ICD-10-CM | POA: Diagnosis not present

## 2016-06-24 DIAGNOSIS — H8109 Meniere's disease, unspecified ear: Secondary | ICD-10-CM | POA: Diagnosis not present

## 2016-06-24 DIAGNOSIS — M1991 Primary osteoarthritis, unspecified site: Secondary | ICD-10-CM | POA: Diagnosis not present

## 2016-06-24 DIAGNOSIS — I10 Essential (primary) hypertension: Secondary | ICD-10-CM | POA: Diagnosis not present

## 2016-06-26 DIAGNOSIS — E1165 Type 2 diabetes mellitus with hyperglycemia: Secondary | ICD-10-CM | POA: Diagnosis not present

## 2016-06-26 DIAGNOSIS — I69398 Other sequelae of cerebral infarction: Secondary | ICD-10-CM | POA: Diagnosis not present

## 2016-06-26 DIAGNOSIS — M6281 Muscle weakness (generalized): Secondary | ICD-10-CM | POA: Diagnosis not present

## 2016-06-26 DIAGNOSIS — H8109 Meniere's disease, unspecified ear: Secondary | ICD-10-CM | POA: Diagnosis not present

## 2016-06-26 DIAGNOSIS — I10 Essential (primary) hypertension: Secondary | ICD-10-CM | POA: Diagnosis not present

## 2016-06-26 DIAGNOSIS — M1991 Primary osteoarthritis, unspecified site: Secondary | ICD-10-CM | POA: Diagnosis not present

## 2016-06-28 DIAGNOSIS — M6281 Muscle weakness (generalized): Secondary | ICD-10-CM | POA: Diagnosis not present

## 2016-06-28 DIAGNOSIS — I69398 Other sequelae of cerebral infarction: Secondary | ICD-10-CM | POA: Diagnosis not present

## 2016-06-28 DIAGNOSIS — H8109 Meniere's disease, unspecified ear: Secondary | ICD-10-CM | POA: Diagnosis not present

## 2016-06-28 DIAGNOSIS — I10 Essential (primary) hypertension: Secondary | ICD-10-CM | POA: Diagnosis not present

## 2016-06-28 DIAGNOSIS — E1165 Type 2 diabetes mellitus with hyperglycemia: Secondary | ICD-10-CM | POA: Diagnosis not present

## 2016-06-28 DIAGNOSIS — M1991 Primary osteoarthritis, unspecified site: Secondary | ICD-10-CM | POA: Diagnosis not present

## 2016-07-01 DIAGNOSIS — H8109 Meniere's disease, unspecified ear: Secondary | ICD-10-CM | POA: Diagnosis not present

## 2016-07-01 DIAGNOSIS — E1165 Type 2 diabetes mellitus with hyperglycemia: Secondary | ICD-10-CM | POA: Diagnosis not present

## 2016-07-01 DIAGNOSIS — I10 Essential (primary) hypertension: Secondary | ICD-10-CM | POA: Diagnosis not present

## 2016-07-01 DIAGNOSIS — M1991 Primary osteoarthritis, unspecified site: Secondary | ICD-10-CM | POA: Diagnosis not present

## 2016-07-01 DIAGNOSIS — I69398 Other sequelae of cerebral infarction: Secondary | ICD-10-CM | POA: Diagnosis not present

## 2016-07-01 DIAGNOSIS — M6281 Muscle weakness (generalized): Secondary | ICD-10-CM | POA: Diagnosis not present

## 2016-07-02 DIAGNOSIS — M1991 Primary osteoarthritis, unspecified site: Secondary | ICD-10-CM | POA: Diagnosis not present

## 2016-07-02 DIAGNOSIS — I69398 Other sequelae of cerebral infarction: Secondary | ICD-10-CM | POA: Diagnosis not present

## 2016-07-02 DIAGNOSIS — E1165 Type 2 diabetes mellitus with hyperglycemia: Secondary | ICD-10-CM | POA: Diagnosis not present

## 2016-07-02 DIAGNOSIS — H8109 Meniere's disease, unspecified ear: Secondary | ICD-10-CM | POA: Diagnosis not present

## 2016-07-02 DIAGNOSIS — I10 Essential (primary) hypertension: Secondary | ICD-10-CM | POA: Diagnosis not present

## 2016-07-02 DIAGNOSIS — M6281 Muscle weakness (generalized): Secondary | ICD-10-CM | POA: Diagnosis not present

## 2016-07-03 DIAGNOSIS — Z7901 Long term (current) use of anticoagulants: Secondary | ICD-10-CM | POA: Diagnosis not present

## 2016-07-03 DIAGNOSIS — S42002A Fracture of unspecified part of left clavicle, initial encounter for closed fracture: Secondary | ICD-10-CM | POA: Diagnosis not present

## 2016-07-03 DIAGNOSIS — Z79899 Other long term (current) drug therapy: Secondary | ICD-10-CM | POA: Diagnosis not present

## 2016-07-03 DIAGNOSIS — I69398 Other sequelae of cerebral infarction: Secondary | ICD-10-CM | POA: Diagnosis not present

## 2016-07-03 DIAGNOSIS — Z86718 Personal history of other venous thrombosis and embolism: Secondary | ICD-10-CM | POA: Diagnosis not present

## 2016-07-03 DIAGNOSIS — E119 Type 2 diabetes mellitus without complications: Secondary | ICD-10-CM | POA: Diagnosis not present

## 2016-07-03 DIAGNOSIS — R51 Headache: Secondary | ICD-10-CM | POA: Diagnosis not present

## 2016-07-03 DIAGNOSIS — M25531 Pain in right wrist: Secondary | ICD-10-CM | POA: Diagnosis not present

## 2016-07-03 DIAGNOSIS — S6992XA Unspecified injury of left wrist, hand and finger(s), initial encounter: Secondary | ICD-10-CM | POA: Diagnosis not present

## 2016-07-03 DIAGNOSIS — Z86711 Personal history of pulmonary embolism: Secondary | ICD-10-CM | POA: Diagnosis not present

## 2016-07-03 DIAGNOSIS — S62511A Displaced fracture of proximal phalanx of right thumb, initial encounter for closed fracture: Secondary | ICD-10-CM | POA: Diagnosis not present

## 2016-07-03 DIAGNOSIS — Z794 Long term (current) use of insulin: Secondary | ICD-10-CM | POA: Diagnosis not present

## 2016-07-03 DIAGNOSIS — M79644 Pain in right finger(s): Secondary | ICD-10-CM | POA: Diagnosis not present

## 2016-07-03 DIAGNOSIS — M6281 Muscle weakness (generalized): Secondary | ICD-10-CM | POA: Diagnosis not present

## 2016-07-03 DIAGNOSIS — M542 Cervicalgia: Secondary | ICD-10-CM | POA: Diagnosis not present

## 2016-07-03 DIAGNOSIS — S42032A Displaced fracture of lateral end of left clavicle, initial encounter for closed fracture: Secondary | ICD-10-CM | POA: Diagnosis not present

## 2016-07-03 DIAGNOSIS — M1991 Primary osteoarthritis, unspecified site: Secondary | ICD-10-CM | POA: Diagnosis not present

## 2016-07-03 DIAGNOSIS — H8109 Meniere's disease, unspecified ear: Secondary | ICD-10-CM | POA: Diagnosis not present

## 2016-07-03 DIAGNOSIS — S0990XA Unspecified injury of head, initial encounter: Secondary | ICD-10-CM | POA: Diagnosis not present

## 2016-07-03 DIAGNOSIS — S199XXA Unspecified injury of neck, initial encounter: Secondary | ICD-10-CM | POA: Diagnosis not present

## 2016-07-03 DIAGNOSIS — E1165 Type 2 diabetes mellitus with hyperglycemia: Secondary | ICD-10-CM | POA: Diagnosis not present

## 2016-07-03 DIAGNOSIS — I10 Essential (primary) hypertension: Secondary | ICD-10-CM | POA: Diagnosis not present

## 2016-07-04 DIAGNOSIS — H8109 Meniere's disease, unspecified ear: Secondary | ICD-10-CM | POA: Diagnosis not present

## 2016-07-04 DIAGNOSIS — E1165 Type 2 diabetes mellitus with hyperglycemia: Secondary | ICD-10-CM | POA: Diagnosis not present

## 2016-07-04 DIAGNOSIS — I10 Essential (primary) hypertension: Secondary | ICD-10-CM | POA: Diagnosis not present

## 2016-07-04 DIAGNOSIS — M1991 Primary osteoarthritis, unspecified site: Secondary | ICD-10-CM | POA: Diagnosis not present

## 2016-07-04 DIAGNOSIS — M6281 Muscle weakness (generalized): Secondary | ICD-10-CM | POA: Diagnosis not present

## 2016-07-04 DIAGNOSIS — I69398 Other sequelae of cerebral infarction: Secondary | ICD-10-CM | POA: Diagnosis not present

## 2016-07-05 DIAGNOSIS — I69398 Other sequelae of cerebral infarction: Secondary | ICD-10-CM | POA: Diagnosis not present

## 2016-07-05 DIAGNOSIS — E1165 Type 2 diabetes mellitus with hyperglycemia: Secondary | ICD-10-CM | POA: Diagnosis not present

## 2016-07-05 DIAGNOSIS — M1991 Primary osteoarthritis, unspecified site: Secondary | ICD-10-CM | POA: Diagnosis not present

## 2016-07-05 DIAGNOSIS — I10 Essential (primary) hypertension: Secondary | ICD-10-CM | POA: Diagnosis not present

## 2016-07-05 DIAGNOSIS — H8109 Meniere's disease, unspecified ear: Secondary | ICD-10-CM | POA: Diagnosis not present

## 2016-07-05 DIAGNOSIS — M6281 Muscle weakness (generalized): Secondary | ICD-10-CM | POA: Diagnosis not present

## 2016-07-08 DIAGNOSIS — M1991 Primary osteoarthritis, unspecified site: Secondary | ICD-10-CM | POA: Diagnosis not present

## 2016-07-08 DIAGNOSIS — M6281 Muscle weakness (generalized): Secondary | ICD-10-CM | POA: Diagnosis not present

## 2016-07-08 DIAGNOSIS — H8109 Meniere's disease, unspecified ear: Secondary | ICD-10-CM | POA: Diagnosis not present

## 2016-07-08 DIAGNOSIS — I10 Essential (primary) hypertension: Secondary | ICD-10-CM | POA: Diagnosis not present

## 2016-07-08 DIAGNOSIS — I69398 Other sequelae of cerebral infarction: Secondary | ICD-10-CM | POA: Diagnosis not present

## 2016-07-08 DIAGNOSIS — E1165 Type 2 diabetes mellitus with hyperglycemia: Secondary | ICD-10-CM | POA: Diagnosis not present

## 2016-07-08 DIAGNOSIS — N390 Urinary tract infection, site not specified: Secondary | ICD-10-CM | POA: Diagnosis not present

## 2016-07-11 DIAGNOSIS — E1165 Type 2 diabetes mellitus with hyperglycemia: Secondary | ICD-10-CM | POA: Diagnosis not present

## 2016-07-11 DIAGNOSIS — H8109 Meniere's disease, unspecified ear: Secondary | ICD-10-CM | POA: Diagnosis not present

## 2016-07-11 DIAGNOSIS — M1991 Primary osteoarthritis, unspecified site: Secondary | ICD-10-CM | POA: Diagnosis not present

## 2016-07-11 DIAGNOSIS — I10 Essential (primary) hypertension: Secondary | ICD-10-CM | POA: Diagnosis not present

## 2016-07-11 DIAGNOSIS — I69398 Other sequelae of cerebral infarction: Secondary | ICD-10-CM | POA: Diagnosis not present

## 2016-07-11 DIAGNOSIS — M6281 Muscle weakness (generalized): Secondary | ICD-10-CM | POA: Diagnosis not present

## 2016-07-15 DIAGNOSIS — M1991 Primary osteoarthritis, unspecified site: Secondary | ICD-10-CM | POA: Diagnosis not present

## 2016-07-15 DIAGNOSIS — E1165 Type 2 diabetes mellitus with hyperglycemia: Secondary | ICD-10-CM | POA: Diagnosis not present

## 2016-07-15 DIAGNOSIS — M6281 Muscle weakness (generalized): Secondary | ICD-10-CM | POA: Diagnosis not present

## 2016-07-15 DIAGNOSIS — H8109 Meniere's disease, unspecified ear: Secondary | ICD-10-CM | POA: Diagnosis not present

## 2016-07-15 DIAGNOSIS — I10 Essential (primary) hypertension: Secondary | ICD-10-CM | POA: Diagnosis not present

## 2016-07-15 DIAGNOSIS — I69398 Other sequelae of cerebral infarction: Secondary | ICD-10-CM | POA: Diagnosis not present

## 2016-07-23 DIAGNOSIS — H8109 Meniere's disease, unspecified ear: Secondary | ICD-10-CM | POA: Diagnosis not present

## 2016-07-23 DIAGNOSIS — E1165 Type 2 diabetes mellitus with hyperglycemia: Secondary | ICD-10-CM | POA: Diagnosis not present

## 2016-07-23 DIAGNOSIS — M1991 Primary osteoarthritis, unspecified site: Secondary | ICD-10-CM | POA: Diagnosis not present

## 2016-07-23 DIAGNOSIS — I10 Essential (primary) hypertension: Secondary | ICD-10-CM | POA: Diagnosis not present

## 2016-07-23 DIAGNOSIS — M6281 Muscle weakness (generalized): Secondary | ICD-10-CM | POA: Diagnosis not present

## 2016-07-23 DIAGNOSIS — I69398 Other sequelae of cerebral infarction: Secondary | ICD-10-CM | POA: Diagnosis not present

## 2016-07-25 DIAGNOSIS — G459 Transient cerebral ischemic attack, unspecified: Secondary | ICD-10-CM | POA: Diagnosis not present

## 2016-07-25 DIAGNOSIS — R479 Unspecified speech disturbances: Secondary | ICD-10-CM | POA: Diagnosis not present

## 2016-07-30 DIAGNOSIS — H903 Sensorineural hearing loss, bilateral: Secondary | ICD-10-CM | POA: Diagnosis not present

## 2016-07-30 DIAGNOSIS — R2689 Other abnormalities of gait and mobility: Secondary | ICD-10-CM | POA: Diagnosis not present

## 2016-07-30 DIAGNOSIS — R42 Dizziness and giddiness: Secondary | ICD-10-CM | POA: Diagnosis not present

## 2016-08-01 DIAGNOSIS — I10 Essential (primary) hypertension: Secondary | ICD-10-CM | POA: Diagnosis not present

## 2016-08-01 DIAGNOSIS — I69398 Other sequelae of cerebral infarction: Secondary | ICD-10-CM | POA: Diagnosis not present

## 2016-08-01 DIAGNOSIS — E1165 Type 2 diabetes mellitus with hyperglycemia: Secondary | ICD-10-CM | POA: Diagnosis not present

## 2016-08-01 DIAGNOSIS — H8109 Meniere's disease, unspecified ear: Secondary | ICD-10-CM | POA: Diagnosis not present

## 2016-08-01 DIAGNOSIS — M1991 Primary osteoarthritis, unspecified site: Secondary | ICD-10-CM | POA: Diagnosis not present

## 2016-08-01 DIAGNOSIS — M6281 Muscle weakness (generalized): Secondary | ICD-10-CM | POA: Diagnosis not present

## 2016-08-02 DIAGNOSIS — I2699 Other pulmonary embolism without acute cor pulmonale: Secondary | ICD-10-CM | POA: Diagnosis not present

## 2016-08-02 DIAGNOSIS — I10 Essential (primary) hypertension: Secondary | ICD-10-CM | POA: Diagnosis not present

## 2016-08-02 DIAGNOSIS — I69398 Other sequelae of cerebral infarction: Secondary | ICD-10-CM | POA: Diagnosis not present

## 2016-08-02 DIAGNOSIS — Z6830 Body mass index (BMI) 30.0-30.9, adult: Secondary | ICD-10-CM | POA: Diagnosis not present

## 2016-08-02 DIAGNOSIS — Z713 Dietary counseling and surveillance: Secondary | ICD-10-CM | POA: Diagnosis not present

## 2016-08-02 DIAGNOSIS — H8109 Meniere's disease, unspecified ear: Secondary | ICD-10-CM | POA: Diagnosis not present

## 2016-08-02 DIAGNOSIS — M1991 Primary osteoarthritis, unspecified site: Secondary | ICD-10-CM | POA: Diagnosis not present

## 2016-08-02 DIAGNOSIS — E1165 Type 2 diabetes mellitus with hyperglycemia: Secondary | ICD-10-CM | POA: Diagnosis not present

## 2016-08-02 DIAGNOSIS — M6281 Muscle weakness (generalized): Secondary | ICD-10-CM | POA: Diagnosis not present

## 2016-08-02 DIAGNOSIS — Z299 Encounter for prophylactic measures, unspecified: Secondary | ICD-10-CM | POA: Diagnosis not present

## 2016-08-05 DIAGNOSIS — E1165 Type 2 diabetes mellitus with hyperglycemia: Secondary | ICD-10-CM | POA: Diagnosis not present

## 2016-08-05 DIAGNOSIS — H8109 Meniere's disease, unspecified ear: Secondary | ICD-10-CM | POA: Diagnosis not present

## 2016-08-05 DIAGNOSIS — I69398 Other sequelae of cerebral infarction: Secondary | ICD-10-CM | POA: Diagnosis not present

## 2016-08-05 DIAGNOSIS — I10 Essential (primary) hypertension: Secondary | ICD-10-CM | POA: Diagnosis not present

## 2016-08-05 DIAGNOSIS — M1991 Primary osteoarthritis, unspecified site: Secondary | ICD-10-CM | POA: Diagnosis not present

## 2016-08-05 DIAGNOSIS — M6281 Muscle weakness (generalized): Secondary | ICD-10-CM | POA: Diagnosis not present

## 2016-08-06 DIAGNOSIS — H8109 Meniere's disease, unspecified ear: Secondary | ICD-10-CM | POA: Diagnosis not present

## 2016-08-06 DIAGNOSIS — I69398 Other sequelae of cerebral infarction: Secondary | ICD-10-CM | POA: Diagnosis not present

## 2016-08-06 DIAGNOSIS — R8290 Unspecified abnormal findings in urine: Secondary | ICD-10-CM | POA: Diagnosis not present

## 2016-08-06 DIAGNOSIS — M6281 Muscle weakness (generalized): Secondary | ICD-10-CM | POA: Diagnosis not present

## 2016-08-06 DIAGNOSIS — E1165 Type 2 diabetes mellitus with hyperglycemia: Secondary | ICD-10-CM | POA: Diagnosis not present

## 2016-08-06 DIAGNOSIS — Z7901 Long term (current) use of anticoagulants: Secondary | ICD-10-CM | POA: Diagnosis not present

## 2016-08-06 DIAGNOSIS — I10 Essential (primary) hypertension: Secondary | ICD-10-CM | POA: Diagnosis not present

## 2016-08-06 DIAGNOSIS — M1991 Primary osteoarthritis, unspecified site: Secondary | ICD-10-CM | POA: Diagnosis not present

## 2016-08-06 DIAGNOSIS — Z86711 Personal history of pulmonary embolism: Secondary | ICD-10-CM | POA: Diagnosis not present

## 2016-08-06 DIAGNOSIS — Z794 Long term (current) use of insulin: Secondary | ICD-10-CM | POA: Diagnosis not present

## 2016-08-06 DIAGNOSIS — Z86718 Personal history of other venous thrombosis and embolism: Secondary | ICD-10-CM | POA: Diagnosis not present

## 2016-08-08 DIAGNOSIS — M1991 Primary osteoarthritis, unspecified site: Secondary | ICD-10-CM | POA: Diagnosis not present

## 2016-08-08 DIAGNOSIS — M6281 Muscle weakness (generalized): Secondary | ICD-10-CM | POA: Diagnosis not present

## 2016-08-08 DIAGNOSIS — H8109 Meniere's disease, unspecified ear: Secondary | ICD-10-CM | POA: Diagnosis not present

## 2016-08-08 DIAGNOSIS — I69398 Other sequelae of cerebral infarction: Secondary | ICD-10-CM | POA: Diagnosis not present

## 2016-08-08 DIAGNOSIS — E1165 Type 2 diabetes mellitus with hyperglycemia: Secondary | ICD-10-CM | POA: Diagnosis not present

## 2016-08-08 DIAGNOSIS — I10 Essential (primary) hypertension: Secondary | ICD-10-CM | POA: Diagnosis not present

## 2016-08-09 ENCOUNTER — Ambulatory Visit: Payer: Medicare Other | Admitting: Internal Medicine

## 2016-08-09 DIAGNOSIS — I69398 Other sequelae of cerebral infarction: Secondary | ICD-10-CM | POA: Diagnosis not present

## 2016-08-09 DIAGNOSIS — H8109 Meniere's disease, unspecified ear: Secondary | ICD-10-CM | POA: Diagnosis not present

## 2016-08-09 DIAGNOSIS — E1165 Type 2 diabetes mellitus with hyperglycemia: Secondary | ICD-10-CM | POA: Diagnosis not present

## 2016-08-09 DIAGNOSIS — I10 Essential (primary) hypertension: Secondary | ICD-10-CM | POA: Diagnosis not present

## 2016-08-09 DIAGNOSIS — M6281 Muscle weakness (generalized): Secondary | ICD-10-CM | POA: Diagnosis not present

## 2016-08-09 DIAGNOSIS — M1991 Primary osteoarthritis, unspecified site: Secondary | ICD-10-CM | POA: Diagnosis not present

## 2016-08-12 ENCOUNTER — Telehealth: Payer: Self-pay | Admitting: Internal Medicine

## 2016-08-12 DIAGNOSIS — M1991 Primary osteoarthritis, unspecified site: Secondary | ICD-10-CM | POA: Diagnosis not present

## 2016-08-12 DIAGNOSIS — I10 Essential (primary) hypertension: Secondary | ICD-10-CM | POA: Diagnosis not present

## 2016-08-12 DIAGNOSIS — E1165 Type 2 diabetes mellitus with hyperglycemia: Secondary | ICD-10-CM | POA: Diagnosis not present

## 2016-08-12 DIAGNOSIS — M6281 Muscle weakness (generalized): Secondary | ICD-10-CM | POA: Diagnosis not present

## 2016-08-12 DIAGNOSIS — I69398 Other sequelae of cerebral infarction: Secondary | ICD-10-CM | POA: Diagnosis not present

## 2016-08-12 DIAGNOSIS — H8109 Meniere's disease, unspecified ear: Secondary | ICD-10-CM | POA: Diagnosis not present

## 2016-08-12 NOTE — Telephone Encounter (Signed)
3mo

## 2016-08-12 NOTE — Telephone Encounter (Signed)
Patient no showed today's appt. Please advise on how to follow up. °A. No follow up necessary. °B. Follow up urgent. Contact patient immediately. °C. Follow up necessary. Contact patient and schedule visit in ___ days. °D. Follow up advised. Contact patient and schedule visit in ____weeks. ° °

## 2016-08-13 DIAGNOSIS — I10 Essential (primary) hypertension: Secondary | ICD-10-CM | POA: Diagnosis not present

## 2016-08-13 DIAGNOSIS — I69398 Other sequelae of cerebral infarction: Secondary | ICD-10-CM | POA: Diagnosis not present

## 2016-08-13 DIAGNOSIS — E119 Type 2 diabetes mellitus without complications: Secondary | ICD-10-CM | POA: Diagnosis not present

## 2016-08-13 DIAGNOSIS — E1165 Type 2 diabetes mellitus with hyperglycemia: Secondary | ICD-10-CM | POA: Diagnosis not present

## 2016-08-13 DIAGNOSIS — M6281 Muscle weakness (generalized): Secondary | ICD-10-CM | POA: Diagnosis not present

## 2016-08-13 DIAGNOSIS — M1991 Primary osteoarthritis, unspecified site: Secondary | ICD-10-CM | POA: Diagnosis not present

## 2016-08-13 DIAGNOSIS — H8109 Meniere's disease, unspecified ear: Secondary | ICD-10-CM | POA: Diagnosis not present

## 2016-08-15 DIAGNOSIS — M1991 Primary osteoarthritis, unspecified site: Secondary | ICD-10-CM | POA: Diagnosis not present

## 2016-08-15 DIAGNOSIS — I69398 Other sequelae of cerebral infarction: Secondary | ICD-10-CM | POA: Diagnosis not present

## 2016-08-15 DIAGNOSIS — H8109 Meniere's disease, unspecified ear: Secondary | ICD-10-CM | POA: Diagnosis not present

## 2016-08-15 DIAGNOSIS — E1165 Type 2 diabetes mellitus with hyperglycemia: Secondary | ICD-10-CM | POA: Diagnosis not present

## 2016-08-15 DIAGNOSIS — M6281 Muscle weakness (generalized): Secondary | ICD-10-CM | POA: Diagnosis not present

## 2016-08-15 DIAGNOSIS — I10 Essential (primary) hypertension: Secondary | ICD-10-CM | POA: Diagnosis not present

## 2016-08-16 DIAGNOSIS — I2699 Other pulmonary embolism without acute cor pulmonale: Secondary | ICD-10-CM | POA: Diagnosis not present

## 2016-08-16 DIAGNOSIS — I82401 Acute embolism and thrombosis of unspecified deep veins of right lower extremity: Secondary | ICD-10-CM | POA: Diagnosis not present

## 2016-08-16 DIAGNOSIS — Z6829 Body mass index (BMI) 29.0-29.9, adult: Secondary | ICD-10-CM | POA: Diagnosis not present

## 2016-08-16 DIAGNOSIS — E1165 Type 2 diabetes mellitus with hyperglycemia: Secondary | ICD-10-CM | POA: Diagnosis not present

## 2016-08-16 DIAGNOSIS — E1142 Type 2 diabetes mellitus with diabetic polyneuropathy: Secondary | ICD-10-CM | POA: Diagnosis not present

## 2016-08-16 DIAGNOSIS — Z299 Encounter for prophylactic measures, unspecified: Secondary | ICD-10-CM | POA: Diagnosis not present

## 2016-08-16 DIAGNOSIS — I639 Cerebral infarction, unspecified: Secondary | ICD-10-CM | POA: Diagnosis not present

## 2016-08-16 DIAGNOSIS — G40409 Other generalized epilepsy and epileptic syndromes, not intractable, without status epilepticus: Secondary | ICD-10-CM | POA: Diagnosis not present

## 2016-08-16 DIAGNOSIS — F419 Anxiety disorder, unspecified: Secondary | ICD-10-CM | POA: Diagnosis not present

## 2016-08-19 DIAGNOSIS — M1991 Primary osteoarthritis, unspecified site: Secondary | ICD-10-CM | POA: Diagnosis not present

## 2016-08-19 DIAGNOSIS — E1165 Type 2 diabetes mellitus with hyperglycemia: Secondary | ICD-10-CM | POA: Diagnosis not present

## 2016-08-19 DIAGNOSIS — I10 Essential (primary) hypertension: Secondary | ICD-10-CM | POA: Diagnosis not present

## 2016-08-19 DIAGNOSIS — I69398 Other sequelae of cerebral infarction: Secondary | ICD-10-CM | POA: Diagnosis not present

## 2016-08-19 DIAGNOSIS — M6281 Muscle weakness (generalized): Secondary | ICD-10-CM | POA: Diagnosis not present

## 2016-08-19 DIAGNOSIS — H8109 Meniere's disease, unspecified ear: Secondary | ICD-10-CM | POA: Diagnosis not present

## 2016-08-20 DIAGNOSIS — M6281 Muscle weakness (generalized): Secondary | ICD-10-CM | POA: Diagnosis not present

## 2016-08-20 DIAGNOSIS — H8109 Meniere's disease, unspecified ear: Secondary | ICD-10-CM | POA: Diagnosis not present

## 2016-08-20 DIAGNOSIS — M1991 Primary osteoarthritis, unspecified site: Secondary | ICD-10-CM | POA: Diagnosis not present

## 2016-08-20 DIAGNOSIS — I69398 Other sequelae of cerebral infarction: Secondary | ICD-10-CM | POA: Diagnosis not present

## 2016-08-20 DIAGNOSIS — E1165 Type 2 diabetes mellitus with hyperglycemia: Secondary | ICD-10-CM | POA: Diagnosis not present

## 2016-08-20 DIAGNOSIS — I10 Essential (primary) hypertension: Secondary | ICD-10-CM | POA: Diagnosis not present

## 2016-08-21 DIAGNOSIS — I69398 Other sequelae of cerebral infarction: Secondary | ICD-10-CM | POA: Diagnosis not present

## 2016-08-21 DIAGNOSIS — E1165 Type 2 diabetes mellitus with hyperglycemia: Secondary | ICD-10-CM | POA: Diagnosis not present

## 2016-08-21 DIAGNOSIS — H8109 Meniere's disease, unspecified ear: Secondary | ICD-10-CM | POA: Diagnosis not present

## 2016-08-21 DIAGNOSIS — M1991 Primary osteoarthritis, unspecified site: Secondary | ICD-10-CM | POA: Diagnosis not present

## 2016-08-21 DIAGNOSIS — M6281 Muscle weakness (generalized): Secondary | ICD-10-CM | POA: Diagnosis not present

## 2016-08-21 DIAGNOSIS — I10 Essential (primary) hypertension: Secondary | ICD-10-CM | POA: Diagnosis not present

## 2016-08-22 DIAGNOSIS — M6281 Muscle weakness (generalized): Secondary | ICD-10-CM | POA: Diagnosis not present

## 2016-08-22 DIAGNOSIS — H8109 Meniere's disease, unspecified ear: Secondary | ICD-10-CM | POA: Diagnosis not present

## 2016-08-22 DIAGNOSIS — I10 Essential (primary) hypertension: Secondary | ICD-10-CM | POA: Diagnosis not present

## 2016-08-22 DIAGNOSIS — E1165 Type 2 diabetes mellitus with hyperglycemia: Secondary | ICD-10-CM | POA: Diagnosis not present

## 2016-08-22 DIAGNOSIS — I69398 Other sequelae of cerebral infarction: Secondary | ICD-10-CM | POA: Diagnosis not present

## 2016-08-22 DIAGNOSIS — M1991 Primary osteoarthritis, unspecified site: Secondary | ICD-10-CM | POA: Diagnosis not present

## 2016-08-23 DIAGNOSIS — M1991 Primary osteoarthritis, unspecified site: Secondary | ICD-10-CM | POA: Diagnosis not present

## 2016-08-23 DIAGNOSIS — H8109 Meniere's disease, unspecified ear: Secondary | ICD-10-CM | POA: Diagnosis not present

## 2016-08-23 DIAGNOSIS — E1165 Type 2 diabetes mellitus with hyperglycemia: Secondary | ICD-10-CM | POA: Diagnosis not present

## 2016-08-23 DIAGNOSIS — M6281 Muscle weakness (generalized): Secondary | ICD-10-CM | POA: Diagnosis not present

## 2016-08-23 DIAGNOSIS — I69398 Other sequelae of cerebral infarction: Secondary | ICD-10-CM | POA: Diagnosis not present

## 2016-08-23 DIAGNOSIS — I10 Essential (primary) hypertension: Secondary | ICD-10-CM | POA: Diagnosis not present

## 2016-08-26 DIAGNOSIS — I10 Essential (primary) hypertension: Secondary | ICD-10-CM | POA: Diagnosis not present

## 2016-08-26 DIAGNOSIS — M6281 Muscle weakness (generalized): Secondary | ICD-10-CM | POA: Diagnosis not present

## 2016-08-26 DIAGNOSIS — M1991 Primary osteoarthritis, unspecified site: Secondary | ICD-10-CM | POA: Diagnosis not present

## 2016-08-26 DIAGNOSIS — H8109 Meniere's disease, unspecified ear: Secondary | ICD-10-CM | POA: Diagnosis not present

## 2016-08-26 DIAGNOSIS — I69398 Other sequelae of cerebral infarction: Secondary | ICD-10-CM | POA: Diagnosis not present

## 2016-08-26 DIAGNOSIS — E1165 Type 2 diabetes mellitus with hyperglycemia: Secondary | ICD-10-CM | POA: Diagnosis not present

## 2016-08-27 ENCOUNTER — Encounter: Payer: Self-pay | Admitting: Cardiovascular Disease

## 2016-08-27 ENCOUNTER — Ambulatory Visit (INDEPENDENT_AMBULATORY_CARE_PROVIDER_SITE_OTHER): Payer: Medicare Other | Admitting: Cardiovascular Disease

## 2016-08-27 VITALS — BP 136/76 | HR 86 | Ht 63.0 in | Wt 176.6 lb

## 2016-08-27 DIAGNOSIS — I10 Essential (primary) hypertension: Secondary | ICD-10-CM

## 2016-08-27 DIAGNOSIS — H8109 Meniere's disease, unspecified ear: Secondary | ICD-10-CM | POA: Diagnosis not present

## 2016-08-27 DIAGNOSIS — R002 Palpitations: Secondary | ICD-10-CM

## 2016-08-27 DIAGNOSIS — E1165 Type 2 diabetes mellitus with hyperglycemia: Secondary | ICD-10-CM | POA: Diagnosis not present

## 2016-08-27 DIAGNOSIS — M1991 Primary osteoarthritis, unspecified site: Secondary | ICD-10-CM | POA: Diagnosis not present

## 2016-08-27 DIAGNOSIS — M6281 Muscle weakness (generalized): Secondary | ICD-10-CM | POA: Diagnosis not present

## 2016-08-27 DIAGNOSIS — I69398 Other sequelae of cerebral infarction: Secondary | ICD-10-CM | POA: Diagnosis not present

## 2016-08-27 NOTE — Progress Notes (Signed)
Cardiology Office Note   Date:  08/27/2016   ID:  Sandra [name], DOB 1941-01-27, MRN [id number]  PCP:  Glenda Chroman, MD  Cardiologist:   Kathlyn Sacramento, MD   Chief Complaint  Patient presents with  . Follow-up      History of Present Illness: Sandra [name] is a 76 y.o. female who presents for a follow up visit regarding palpitations due to PVCs and short runs of SVT.  She has chronic medical conditions that include type 2 diabetes, DVT/ PE on anticoagulation and hypertension.  Her cardiac evaluation in May, 2013 included a nuclear stress test which showed no evidence of ischemia with normal ejection fraction. Holter monitor in the same year showed short runs of supraventricular tachycardia and frequent PVCs.  She was started on Toprol with improvement in symptoms.  Echocardiogram in April 2016 showed normal LV systolic function with grade 1 diastolic dysfunction, mild tricuspid regurgitation and minimal pulmonary hypertension. Carotid Doppler showed mild nonobstructive atherosclerosis bilaterally.  She has been doing reasonably well but she reports having TIAs recently. No chest pain or shortness of breath. She reports stable palpitations. She was switched from Xarelto to warfarin due to cost.  Past Medical History:  Diagnosis Date  . Arthritis   . Diabetes mellitus   . DVT (deep venous thrombosis), left 04/2011   after an ankle fracture.   . Hypertension   . Mitral regurgitation 07/2011   moderate  . Pulmonary embolism ([place]) 04/2011    Past Surgical History:  Procedure Laterality Date  . ABDOMINAL HYSTERECTOMY    . CATARACT EXTRACTION    . COLONOSCOPY  05/07/2012   Procedure: COLONOSCOPY;  Surgeon: Rogene Houston, MD;  [facility];  Service: Endoscopy;  Laterality: N/A;  245  . FOOT SURGERY    . HERNIA REPAIR    . TONSILLECTOMY       Current Outpatient Prescriptions  Medication Sig Dispense Refill  . amLODipine (NORVASC) 5 MG tablet Take 5 mg by  mouth daily.     . Cholecalciferol (VITAMIN D-3) 1000 UNITS CAPS Take 1,000 Units by mouth daily.    . diazepam (VALIUM) 2 MG tablet Take 2 mg by mouth daily.    . DULoxetine (CYMBALTA) 60 MG capsule     . hydrochlorothiazide (HYDRODIURIL) 12.5 MG tablet Take 12.5 mg by mouth daily.     . Insulin Glargine (TOUJEO SOLOSTAR) 300 UNIT/ML SOPN Inject 38 Units into the skin every morning. 9 pen 3  . insulin lispro (HUMALOG KWIKPEN) 100 UNIT/ML KiwkPen Inject 0.16-0.2 mLs (16-20 Units total) into the skin 3 (three) times daily. 45 mL 3  . metFORMIN (GLUCOPHAGE) 500 MG tablet Take 1 tablet (500 mg total) by mouth 2 (two) times daily with a meal. Patient takes 2 in the morning and 1 at night 180 tablet 3  . metoprolol succinate (TOPROL-XL) 50 MG 24 hr tablet Take 1 tablet (50 mg total) by mouth daily. (Patient taking differently: Take 150 mg by mouth daily. ) 30 tablet 6  . metoprolol succinate (TOPROL-XL) 50 MG 24 hr tablet     . pantoprazole (PROTONIX) 40 MG tablet TAKE ONE TABLET DAILY 30 tablet 3  . warfarin (COUMADIN) 5 MG tablet Take as directed by the coumadin clinic     No current facility-administered medications for this visit.     Allergies:   Buprenex [buprenorphine]; Namenda [memantine hcl]; and Other    Social History:  The patient  reports that she has  never smoked. She has never used smokeless tobacco. She reports that she does not drink alcohol or use drugs.   Family History:  The patient's family history includes Diabetes in her mother; Heart attack in her mother; Hypertension in her mother.      PHYSICAL EXAM: VS:  BP 136/76   Pulse 86   Ht 5\' 3"  (1.6 m)   Wt 176 lb 9.6 oz (80.1 kg)   BMI 31.28 kg/m  , BMI Body mass index is 31.28 kg/m. GEN: Well nourished, well developed, in no acute distress  HEENT: normal  Neck: no JVD, carotid bruits, or masses Cardiac: RRR; no murmurs, rubs, or gallops,no edema  Respiratory:  clear to auscultation bilaterally, normal work of  breathing GI: soft, nontender, nondistended, + BS MS: no deformity or atrophy  Skin: warm and dry, no rash Neuro:  Strength and sensation are intact Psych: euthymic mood, full affect   EKG:  EKG is ordered today. The ekg ordered today demonstrates  normal sinus rhythm with nonspecific ST and T wave changes.   Recent Labs: No results found for requested labs within last 8760 hours.    Lipid Panel No results found for: CHOL, TRIG, HDL, CHOLHDL, VLDL, LDLCALC, LDLDIRECT    Wt Readings from Last 3 Encounters:  08/27/16 176 lb 9.6 oz (80.1 kg)  02/26/16 174 lb (78.9 kg)  12/29/15 171 lb (77.6 kg)        ASSESSMENT AND PLAN:  1.  Palpitations: Due to known PACs and PVCs. Her symptoms seem to be stable overall on Toprol.   2. Essential hypertension: Blood pressure is reasonably controlled on current medications.  3. Depression: She follows up with neurology.    Disposition:   FU with me as needed.   Signed, Kathlyn Sacramento, MD  08/27/2016 11:38 AM    [facility]

## 2016-08-27 NOTE — Patient Instructions (Signed)

## 2016-08-28 DIAGNOSIS — I10 Essential (primary) hypertension: Secondary | ICD-10-CM | POA: Diagnosis not present

## 2016-08-28 DIAGNOSIS — H8109 Meniere's disease, unspecified ear: Secondary | ICD-10-CM | POA: Diagnosis not present

## 2016-08-28 DIAGNOSIS — M6281 Muscle weakness (generalized): Secondary | ICD-10-CM | POA: Diagnosis not present

## 2016-08-28 DIAGNOSIS — E1165 Type 2 diabetes mellitus with hyperglycemia: Secondary | ICD-10-CM | POA: Diagnosis not present

## 2016-08-28 DIAGNOSIS — G40409 Other generalized epilepsy and epileptic syndromes, not intractable, without status epilepticus: Secondary | ICD-10-CM | POA: Diagnosis not present

## 2016-08-28 DIAGNOSIS — M1991 Primary osteoarthritis, unspecified site: Secondary | ICD-10-CM | POA: Diagnosis not present

## 2016-08-28 DIAGNOSIS — I69398 Other sequelae of cerebral infarction: Secondary | ICD-10-CM | POA: Diagnosis not present

## 2016-08-29 DIAGNOSIS — E1165 Type 2 diabetes mellitus with hyperglycemia: Secondary | ICD-10-CM | POA: Diagnosis not present

## 2016-08-29 DIAGNOSIS — I10 Essential (primary) hypertension: Secondary | ICD-10-CM | POA: Diagnosis not present

## 2016-08-29 DIAGNOSIS — I69398 Other sequelae of cerebral infarction: Secondary | ICD-10-CM | POA: Diagnosis not present

## 2016-08-29 DIAGNOSIS — M1991 Primary osteoarthritis, unspecified site: Secondary | ICD-10-CM | POA: Diagnosis not present

## 2016-08-29 DIAGNOSIS — H8109 Meniere's disease, unspecified ear: Secondary | ICD-10-CM | POA: Diagnosis not present

## 2016-08-29 DIAGNOSIS — M6281 Muscle weakness (generalized): Secondary | ICD-10-CM | POA: Diagnosis not present

## 2016-08-30 DIAGNOSIS — E1165 Type 2 diabetes mellitus with hyperglycemia: Secondary | ICD-10-CM | POA: Diagnosis not present

## 2016-08-30 DIAGNOSIS — H8109 Meniere's disease, unspecified ear: Secondary | ICD-10-CM | POA: Diagnosis not present

## 2016-08-30 DIAGNOSIS — M1991 Primary osteoarthritis, unspecified site: Secondary | ICD-10-CM | POA: Diagnosis not present

## 2016-08-30 DIAGNOSIS — M6281 Muscle weakness (generalized): Secondary | ICD-10-CM | POA: Diagnosis not present

## 2016-08-30 DIAGNOSIS — I69398 Other sequelae of cerebral infarction: Secondary | ICD-10-CM | POA: Diagnosis not present

## 2016-08-30 DIAGNOSIS — I10 Essential (primary) hypertension: Secondary | ICD-10-CM | POA: Diagnosis not present

## 2016-09-02 DIAGNOSIS — Z299 Encounter for prophylactic measures, unspecified: Secondary | ICD-10-CM | POA: Diagnosis not present

## 2016-09-02 DIAGNOSIS — I82401 Acute embolism and thrombosis of unspecified deep veins of right lower extremity: Secondary | ICD-10-CM | POA: Diagnosis not present

## 2016-09-02 DIAGNOSIS — Z713 Dietary counseling and surveillance: Secondary | ICD-10-CM | POA: Diagnosis not present

## 2016-09-02 DIAGNOSIS — I10 Essential (primary) hypertension: Secondary | ICD-10-CM | POA: Diagnosis not present

## 2016-09-02 DIAGNOSIS — Z6830 Body mass index (BMI) 30.0-30.9, adult: Secondary | ICD-10-CM | POA: Diagnosis not present

## 2016-09-03 DIAGNOSIS — E1165 Type 2 diabetes mellitus with hyperglycemia: Secondary | ICD-10-CM | POA: Diagnosis not present

## 2016-09-03 DIAGNOSIS — M6281 Muscle weakness (generalized): Secondary | ICD-10-CM | POA: Diagnosis not present

## 2016-09-03 DIAGNOSIS — M1991 Primary osteoarthritis, unspecified site: Secondary | ICD-10-CM | POA: Diagnosis not present

## 2016-09-03 DIAGNOSIS — I10 Essential (primary) hypertension: Secondary | ICD-10-CM | POA: Diagnosis not present

## 2016-09-03 DIAGNOSIS — H8109 Meniere's disease, unspecified ear: Secondary | ICD-10-CM | POA: Diagnosis not present

## 2016-09-03 DIAGNOSIS — I69398 Other sequelae of cerebral infarction: Secondary | ICD-10-CM | POA: Diagnosis not present

## 2016-09-05 DIAGNOSIS — N3946 Mixed incontinence: Secondary | ICD-10-CM | POA: Diagnosis not present

## 2016-09-05 DIAGNOSIS — R350 Frequency of micturition: Secondary | ICD-10-CM | POA: Diagnosis not present

## 2016-09-05 DIAGNOSIS — I69398 Other sequelae of cerebral infarction: Secondary | ICD-10-CM | POA: Diagnosis not present

## 2016-09-05 DIAGNOSIS — M6281 Muscle weakness (generalized): Secondary | ICD-10-CM | POA: Diagnosis not present

## 2016-09-05 DIAGNOSIS — I10 Essential (primary) hypertension: Secondary | ICD-10-CM | POA: Diagnosis not present

## 2016-09-05 DIAGNOSIS — E1165 Type 2 diabetes mellitus with hyperglycemia: Secondary | ICD-10-CM | POA: Diagnosis not present

## 2016-09-05 DIAGNOSIS — R3915 Urgency of urination: Secondary | ICD-10-CM | POA: Diagnosis not present

## 2016-09-05 DIAGNOSIS — M1991 Primary osteoarthritis, unspecified site: Secondary | ICD-10-CM | POA: Diagnosis not present

## 2016-09-05 DIAGNOSIS — H8109 Meniere's disease, unspecified ear: Secondary | ICD-10-CM | POA: Diagnosis not present

## 2016-09-09 DIAGNOSIS — E1165 Type 2 diabetes mellitus with hyperglycemia: Secondary | ICD-10-CM | POA: Diagnosis not present

## 2016-09-09 DIAGNOSIS — M1991 Primary osteoarthritis, unspecified site: Secondary | ICD-10-CM | POA: Diagnosis not present

## 2016-09-09 DIAGNOSIS — I10 Essential (primary) hypertension: Secondary | ICD-10-CM | POA: Diagnosis not present

## 2016-09-09 DIAGNOSIS — I69398 Other sequelae of cerebral infarction: Secondary | ICD-10-CM | POA: Diagnosis not present

## 2016-09-09 DIAGNOSIS — H8109 Meniere's disease, unspecified ear: Secondary | ICD-10-CM | POA: Diagnosis not present

## 2016-09-09 DIAGNOSIS — M6281 Muscle weakness (generalized): Secondary | ICD-10-CM | POA: Diagnosis not present

## 2016-09-11 ENCOUNTER — Telehealth: Payer: Self-pay | Admitting: Internal Medicine

## 2016-09-11 ENCOUNTER — Ambulatory Visit: Payer: Medicare Other | Admitting: Internal Medicine

## 2016-09-11 DIAGNOSIS — Z0289 Encounter for other administrative examinations: Secondary | ICD-10-CM

## 2016-09-11 NOTE — Telephone Encounter (Signed)
Patient no showed today's appt. Please advise on how to follow up. °A. No follow up necessary. °B. Follow up urgent. Contact patient immediately. °C. Follow up necessary. Contact patient and schedule visit in ___ days. °D. Follow up advised. Contact patient and schedule visit in ____weeks. ° °

## 2016-09-11 NOTE — Telephone Encounter (Signed)
Ok, I'll let stephanie know

## 2016-09-11 NOTE — Telephone Encounter (Signed)
Noncompliant >> no showed 3 appts >> will need to d/c her

## 2016-09-12 ENCOUNTER — Encounter: Payer: Self-pay | Admitting: Internal Medicine

## 2016-09-12 DIAGNOSIS — M1991 Primary osteoarthritis, unspecified site: Secondary | ICD-10-CM | POA: Diagnosis not present

## 2016-09-12 DIAGNOSIS — I69398 Other sequelae of cerebral infarction: Secondary | ICD-10-CM | POA: Diagnosis not present

## 2016-09-12 DIAGNOSIS — E1165 Type 2 diabetes mellitus with hyperglycemia: Secondary | ICD-10-CM | POA: Diagnosis not present

## 2016-09-12 DIAGNOSIS — I10 Essential (primary) hypertension: Secondary | ICD-10-CM | POA: Diagnosis not present

## 2016-09-12 DIAGNOSIS — H8109 Meniere's disease, unspecified ear: Secondary | ICD-10-CM | POA: Diagnosis not present

## 2016-09-12 DIAGNOSIS — M6281 Muscle weakness (generalized): Secondary | ICD-10-CM | POA: Diagnosis not present

## 2016-09-12 DIAGNOSIS — E119 Type 2 diabetes mellitus without complications: Secondary | ICD-10-CM | POA: Diagnosis not present

## 2016-09-12 NOTE — Telephone Encounter (Signed)
Given papers to [place].

## 2016-09-12 NOTE — Telephone Encounter (Signed)
Made the appt note reflect to not reschedule. Please complete the dismissal procedure.

## 2016-09-16 DIAGNOSIS — E1165 Type 2 diabetes mellitus with hyperglycemia: Secondary | ICD-10-CM | POA: Diagnosis not present

## 2016-09-16 DIAGNOSIS — M1991 Primary osteoarthritis, unspecified site: Secondary | ICD-10-CM | POA: Diagnosis not present

## 2016-09-16 DIAGNOSIS — I10 Essential (primary) hypertension: Secondary | ICD-10-CM | POA: Diagnosis not present

## 2016-09-16 DIAGNOSIS — I69398 Other sequelae of cerebral infarction: Secondary | ICD-10-CM | POA: Diagnosis not present

## 2016-09-16 DIAGNOSIS — M6281 Muscle weakness (generalized): Secondary | ICD-10-CM | POA: Diagnosis not present

## 2016-09-16 DIAGNOSIS — H8109 Meniere's disease, unspecified ear: Secondary | ICD-10-CM | POA: Diagnosis not present

## 2016-09-17 ENCOUNTER — Telehealth: Payer: Self-pay | Admitting: Internal Medicine

## 2016-09-17 NOTE — Telephone Encounter (Signed)
Patient dismissed from [facility] by Layla Maw MD , effective Sep 12, 2016. Dismissal letter sent out by certified / registered mail.  daj

## 2016-09-18 DIAGNOSIS — M6281 Muscle weakness (generalized): Secondary | ICD-10-CM | POA: Diagnosis not present

## 2016-09-18 DIAGNOSIS — E1165 Type 2 diabetes mellitus with hyperglycemia: Secondary | ICD-10-CM | POA: Diagnosis not present

## 2016-09-18 DIAGNOSIS — I69398 Other sequelae of cerebral infarction: Secondary | ICD-10-CM | POA: Diagnosis not present

## 2016-09-18 DIAGNOSIS — H8109 Meniere's disease, unspecified ear: Secondary | ICD-10-CM | POA: Diagnosis not present

## 2016-09-18 DIAGNOSIS — I10 Essential (primary) hypertension: Secondary | ICD-10-CM | POA: Diagnosis not present

## 2016-09-18 DIAGNOSIS — M1991 Primary osteoarthritis, unspecified site: Secondary | ICD-10-CM | POA: Diagnosis not present

## 2016-09-23 NOTE — Telephone Encounter (Signed)
Received signed domestic return receipt verifying delivery of certified letter on Sep 18, 2016. Article number [id number]

## 2016-09-24 DIAGNOSIS — I10 Essential (primary) hypertension: Secondary | ICD-10-CM | POA: Diagnosis not present

## 2016-09-24 DIAGNOSIS — I69398 Other sequelae of cerebral infarction: Secondary | ICD-10-CM | POA: Diagnosis not present

## 2016-09-24 DIAGNOSIS — H8109 Meniere's disease, unspecified ear: Secondary | ICD-10-CM | POA: Diagnosis not present

## 2016-09-24 DIAGNOSIS — E1165 Type 2 diabetes mellitus with hyperglycemia: Secondary | ICD-10-CM | POA: Diagnosis not present

## 2016-09-24 DIAGNOSIS — M6281 Muscle weakness (generalized): Secondary | ICD-10-CM | POA: Diagnosis not present

## 2016-09-24 DIAGNOSIS — M1991 Primary osteoarthritis, unspecified site: Secondary | ICD-10-CM | POA: Diagnosis not present

## 2016-09-30 DIAGNOSIS — E119 Type 2 diabetes mellitus without complications: Secondary | ICD-10-CM | POA: Diagnosis not present

## 2016-09-30 DIAGNOSIS — Z86718 Personal history of other venous thrombosis and embolism: Secondary | ICD-10-CM | POA: Diagnosis not present

## 2016-09-30 DIAGNOSIS — M1990 Unspecified osteoarthritis, unspecified site: Secondary | ICD-10-CM | POA: Diagnosis not present

## 2016-09-30 DIAGNOSIS — F329 Major depressive disorder, single episode, unspecified: Secondary | ICD-10-CM | POA: Diagnosis not present

## 2016-09-30 DIAGNOSIS — I10 Essential (primary) hypertension: Secondary | ICD-10-CM | POA: Diagnosis not present

## 2016-09-30 DIAGNOSIS — Z79899 Other long term (current) drug therapy: Secondary | ICD-10-CM | POA: Diagnosis not present

## 2016-09-30 DIAGNOSIS — Z794 Long term (current) use of insulin: Secondary | ICD-10-CM | POA: Diagnosis not present

## 2016-09-30 DIAGNOSIS — M810 Age-related osteoporosis without current pathological fracture: Secondary | ICD-10-CM | POA: Diagnosis not present

## 2016-09-30 DIAGNOSIS — Z01810 Encounter for preprocedural cardiovascular examination: Secondary | ICD-10-CM | POA: Diagnosis not present

## 2016-09-30 DIAGNOSIS — N393 Stress incontinence (female) (male): Secondary | ICD-10-CM | POA: Diagnosis not present

## 2016-10-02 DIAGNOSIS — M1990 Unspecified osteoarthritis, unspecified site: Secondary | ICD-10-CM | POA: Diagnosis not present

## 2016-10-02 DIAGNOSIS — R32 Unspecified urinary incontinence: Secondary | ICD-10-CM | POA: Diagnosis not present

## 2016-10-02 DIAGNOSIS — E119 Type 2 diabetes mellitus without complications: Secondary | ICD-10-CM | POA: Diagnosis not present

## 2016-10-02 DIAGNOSIS — F329 Major depressive disorder, single episode, unspecified: Secondary | ICD-10-CM | POA: Diagnosis not present

## 2016-10-02 DIAGNOSIS — Z86718 Personal history of other venous thrombosis and embolism: Secondary | ICD-10-CM | POA: Diagnosis not present

## 2016-10-02 DIAGNOSIS — I10 Essential (primary) hypertension: Secondary | ICD-10-CM | POA: Diagnosis not present

## 2016-10-02 DIAGNOSIS — N393 Stress incontinence (female) (male): Secondary | ICD-10-CM | POA: Diagnosis not present

## 2016-10-03 DIAGNOSIS — I10 Essential (primary) hypertension: Secondary | ICD-10-CM | POA: Diagnosis not present

## 2016-10-03 DIAGNOSIS — M6281 Muscle weakness (generalized): Secondary | ICD-10-CM | POA: Diagnosis not present

## 2016-10-03 DIAGNOSIS — E1165 Type 2 diabetes mellitus with hyperglycemia: Secondary | ICD-10-CM | POA: Diagnosis not present

## 2016-10-03 DIAGNOSIS — M1991 Primary osteoarthritis, unspecified site: Secondary | ICD-10-CM | POA: Diagnosis not present

## 2016-10-03 DIAGNOSIS — I69398 Other sequelae of cerebral infarction: Secondary | ICD-10-CM | POA: Diagnosis not present

## 2016-10-03 DIAGNOSIS — H8109 Meniere's disease, unspecified ear: Secondary | ICD-10-CM | POA: Diagnosis not present

## 2016-10-08 ENCOUNTER — Other Ambulatory Visit: Payer: Self-pay | Admitting: Internal Medicine

## 2016-10-18 DIAGNOSIS — Z6830 Body mass index (BMI) 30.0-30.9, adult: Secondary | ICD-10-CM | POA: Diagnosis not present

## 2016-10-18 DIAGNOSIS — Z713 Dietary counseling and surveillance: Secondary | ICD-10-CM | POA: Diagnosis not present

## 2016-10-18 DIAGNOSIS — Z299 Encounter for prophylactic measures, unspecified: Secondary | ICD-10-CM | POA: Diagnosis not present

## 2016-10-18 DIAGNOSIS — B370 Candidal stomatitis: Secondary | ICD-10-CM | POA: Diagnosis not present

## 2016-10-22 ENCOUNTER — Other Ambulatory Visit: Payer: Self-pay

## 2016-10-22 MED ORDER — INSULIN GLARGINE 300 UNIT/ML ~~LOC~~ SOPN: 38.0000 [IU] | PEN_INJECTOR | SUBCUTANEOUS | 2 refills | Status: DC

## 2016-10-24 DIAGNOSIS — N390 Urinary tract infection, site not specified: Secondary | ICD-10-CM | POA: Diagnosis not present

## 2016-10-24 DIAGNOSIS — R350 Frequency of micturition: Secondary | ICD-10-CM | POA: Diagnosis not present

## 2016-10-24 DIAGNOSIS — Z09 Encounter for follow-up examination after completed treatment for conditions other than malignant neoplasm: Secondary | ICD-10-CM | POA: Diagnosis not present

## 2016-10-24 DIAGNOSIS — R3915 Urgency of urination: Secondary | ICD-10-CM | POA: Diagnosis not present

## 2016-10-25 DIAGNOSIS — E119 Type 2 diabetes mellitus without complications: Secondary | ICD-10-CM | POA: Diagnosis not present

## 2016-10-25 DIAGNOSIS — I10 Essential (primary) hypertension: Secondary | ICD-10-CM | POA: Diagnosis not present

## 2016-11-14 DIAGNOSIS — R3915 Urgency of urination: Secondary | ICD-10-CM | POA: Diagnosis not present

## 2016-11-14 DIAGNOSIS — Z5181 Encounter for therapeutic drug level monitoring: Secondary | ICD-10-CM | POA: Diagnosis not present

## 2016-11-19 DIAGNOSIS — I639 Cerebral infarction, unspecified: Secondary | ICD-10-CM | POA: Diagnosis not present

## 2016-11-19 DIAGNOSIS — Z6830 Body mass index (BMI) 30.0-30.9, adult: Secondary | ICD-10-CM | POA: Diagnosis not present

## 2016-11-19 DIAGNOSIS — Z299 Encounter for prophylactic measures, unspecified: Secondary | ICD-10-CM | POA: Diagnosis not present

## 2016-11-19 DIAGNOSIS — E1165 Type 2 diabetes mellitus with hyperglycemia: Secondary | ICD-10-CM | POA: Diagnosis not present

## 2016-11-19 DIAGNOSIS — I10 Essential (primary) hypertension: Secondary | ICD-10-CM | POA: Diagnosis not present

## 2016-11-19 DIAGNOSIS — J069 Acute upper respiratory infection, unspecified: Secondary | ICD-10-CM | POA: Diagnosis not present

## 2016-11-19 DIAGNOSIS — E1142 Type 2 diabetes mellitus with diabetic polyneuropathy: Secondary | ICD-10-CM | POA: Diagnosis not present

## 2017-01-02 DIAGNOSIS — R339 Retention of urine, unspecified: Secondary | ICD-10-CM | POA: Diagnosis not present

## 2017-01-02 DIAGNOSIS — Z299 Encounter for prophylactic measures, unspecified: Secondary | ICD-10-CM | POA: Diagnosis not present

## 2017-01-02 DIAGNOSIS — E1165 Type 2 diabetes mellitus with hyperglycemia: Secondary | ICD-10-CM | POA: Diagnosis not present

## 2017-01-02 DIAGNOSIS — E1142 Type 2 diabetes mellitus with diabetic polyneuropathy: Secondary | ICD-10-CM | POA: Diagnosis not present

## 2017-01-02 DIAGNOSIS — I2699 Other pulmonary embolism without acute cor pulmonale: Secondary | ICD-10-CM | POA: Diagnosis not present

## 2017-01-02 DIAGNOSIS — I10 Essential (primary) hypertension: Secondary | ICD-10-CM | POA: Diagnosis not present

## 2017-01-02 DIAGNOSIS — I639 Cerebral infarction, unspecified: Secondary | ICD-10-CM | POA: Diagnosis not present

## 2017-01-02 DIAGNOSIS — Z6830 Body mass index (BMI) 30.0-30.9, adult: Secondary | ICD-10-CM | POA: Diagnosis not present

## 2017-01-02 DIAGNOSIS — Z713 Dietary counseling and surveillance: Secondary | ICD-10-CM | POA: Diagnosis not present

## 2017-01-02 DIAGNOSIS — F419 Anxiety disorder, unspecified: Secondary | ICD-10-CM | POA: Diagnosis not present

## 2017-01-04 ENCOUNTER — Other Ambulatory Visit: Payer: Self-pay | Admitting: Internal Medicine

## 2017-01-20 ENCOUNTER — Other Ambulatory Visit: Payer: Self-pay | Admitting: Internal Medicine

## 2017-01-23 DIAGNOSIS — N3946 Mixed incontinence: Secondary | ICD-10-CM | POA: Diagnosis not present

## 2017-01-23 DIAGNOSIS — R3915 Urgency of urination: Secondary | ICD-10-CM | POA: Diagnosis not present

## 2017-01-23 DIAGNOSIS — R350 Frequency of micturition: Secondary | ICD-10-CM | POA: Diagnosis not present

## 2017-02-19 DIAGNOSIS — Z23 Encounter for immunization: Secondary | ICD-10-CM | POA: Diagnosis not present

## 2017-02-20 DIAGNOSIS — E119 Type 2 diabetes mellitus without complications: Secondary | ICD-10-CM | POA: Diagnosis not present

## 2017-02-20 DIAGNOSIS — I10 Essential (primary) hypertension: Secondary | ICD-10-CM | POA: Diagnosis not present

## 2017-02-26 DIAGNOSIS — Z7189 Other specified counseling: Secondary | ICD-10-CM | POA: Diagnosis not present

## 2017-02-26 DIAGNOSIS — Z299 Encounter for prophylactic measures, unspecified: Secondary | ICD-10-CM | POA: Diagnosis not present

## 2017-02-26 DIAGNOSIS — I10 Essential (primary) hypertension: Secondary | ICD-10-CM | POA: Diagnosis not present

## 2017-02-26 DIAGNOSIS — R5383 Other fatigue: Secondary | ICD-10-CM | POA: Diagnosis not present

## 2017-02-26 DIAGNOSIS — Z6830 Body mass index (BMI) 30.0-30.9, adult: Secondary | ICD-10-CM | POA: Diagnosis not present

## 2017-02-26 DIAGNOSIS — Z1339 Encounter for screening examination for other mental health and behavioral disorders: Secondary | ICD-10-CM | POA: Diagnosis not present

## 2017-02-26 DIAGNOSIS — Z0000 Encounter for general adult medical examination without abnormal findings: Secondary | ICD-10-CM | POA: Diagnosis not present

## 2017-02-26 DIAGNOSIS — Z1331 Encounter for screening for depression: Secondary | ICD-10-CM | POA: Diagnosis not present

## 2017-02-26 DIAGNOSIS — E7800 Pure hypercholesterolemia, unspecified: Secondary | ICD-10-CM | POA: Diagnosis not present

## 2017-02-26 DIAGNOSIS — Z1211 Encounter for screening for malignant neoplasm of colon: Secondary | ICD-10-CM | POA: Diagnosis not present

## 2017-02-26 DIAGNOSIS — F419 Anxiety disorder, unspecified: Secondary | ICD-10-CM | POA: Diagnosis not present

## 2017-02-26 DIAGNOSIS — F329 Major depressive disorder, single episode, unspecified: Secondary | ICD-10-CM | POA: Diagnosis not present

## 2017-03-05 DIAGNOSIS — K219 Gastro-esophageal reflux disease without esophagitis: Secondary | ICD-10-CM | POA: Diagnosis not present

## 2017-03-05 DIAGNOSIS — F329 Major depressive disorder, single episode, unspecified: Secondary | ICD-10-CM | POA: Diagnosis not present

## 2017-03-05 DIAGNOSIS — N393 Stress incontinence (female) (male): Secondary | ICD-10-CM | POA: Diagnosis not present

## 2017-03-05 DIAGNOSIS — Z01810 Encounter for preprocedural cardiovascular examination: Secondary | ICD-10-CM | POA: Diagnosis not present

## 2017-03-05 DIAGNOSIS — M1990 Unspecified osteoarthritis, unspecified site: Secondary | ICD-10-CM | POA: Diagnosis not present

## 2017-03-05 DIAGNOSIS — Z794 Long term (current) use of insulin: Secondary | ICD-10-CM | POA: Diagnosis not present

## 2017-03-05 DIAGNOSIS — R350 Frequency of micturition: Secondary | ICD-10-CM | POA: Diagnosis not present

## 2017-03-05 DIAGNOSIS — Z7902 Long term (current) use of antithrombotics/antiplatelets: Secondary | ICD-10-CM | POA: Diagnosis not present

## 2017-03-05 DIAGNOSIS — E119 Type 2 diabetes mellitus without complications: Secondary | ICD-10-CM | POA: Diagnosis not present

## 2017-03-05 DIAGNOSIS — Z888 Allergy status to other drugs, medicaments and biological substances status: Secondary | ICD-10-CM | POA: Diagnosis not present

## 2017-03-05 DIAGNOSIS — R3915 Urgency of urination: Secondary | ICD-10-CM | POA: Diagnosis not present

## 2017-03-05 DIAGNOSIS — Z79899 Other long term (current) drug therapy: Secondary | ICD-10-CM | POA: Diagnosis not present

## 2017-03-05 DIAGNOSIS — I10 Essential (primary) hypertension: Secondary | ICD-10-CM | POA: Diagnosis not present

## 2017-03-05 DIAGNOSIS — E079 Disorder of thyroid, unspecified: Secondary | ICD-10-CM | POA: Diagnosis not present

## 2017-03-31 DIAGNOSIS — E119 Type 2 diabetes mellitus without complications: Secondary | ICD-10-CM | POA: Diagnosis not present

## 2017-03-31 DIAGNOSIS — I10 Essential (primary) hypertension: Secondary | ICD-10-CM | POA: Diagnosis not present

## 2017-04-03 DIAGNOSIS — Z09 Encounter for follow-up examination after completed treatment for conditions other than malignant neoplasm: Secondary | ICD-10-CM | POA: Diagnosis not present

## 2017-04-08 DIAGNOSIS — Z713 Dietary counseling and surveillance: Secondary | ICD-10-CM | POA: Diagnosis not present

## 2017-04-08 DIAGNOSIS — I2699 Other pulmonary embolism without acute cor pulmonale: Secondary | ICD-10-CM | POA: Diagnosis not present

## 2017-04-08 DIAGNOSIS — I10 Essential (primary) hypertension: Secondary | ICD-10-CM | POA: Diagnosis not present

## 2017-04-08 DIAGNOSIS — E1142 Type 2 diabetes mellitus with diabetic polyneuropathy: Secondary | ICD-10-CM | POA: Diagnosis not present

## 2017-04-08 DIAGNOSIS — I82401 Acute embolism and thrombosis of unspecified deep veins of right lower extremity: Secondary | ICD-10-CM | POA: Diagnosis not present

## 2017-04-08 DIAGNOSIS — E1165 Type 2 diabetes mellitus with hyperglycemia: Secondary | ICD-10-CM | POA: Diagnosis not present

## 2017-04-08 DIAGNOSIS — Z6830 Body mass index (BMI) 30.0-30.9, adult: Secondary | ICD-10-CM | POA: Diagnosis not present

## 2017-04-08 DIAGNOSIS — I639 Cerebral infarction, unspecified: Secondary | ICD-10-CM | POA: Diagnosis not present

## 2017-04-08 DIAGNOSIS — Z299 Encounter for prophylactic measures, unspecified: Secondary | ICD-10-CM | POA: Diagnosis not present

## 2017-04-12 ENCOUNTER — Other Ambulatory Visit: Payer: Self-pay | Admitting: Internal Medicine

## 2017-04-18 ENCOUNTER — Encounter (INDEPENDENT_AMBULATORY_CARE_PROVIDER_SITE_OTHER): Payer: Self-pay | Admitting: *Deleted

## 2017-05-01 DIAGNOSIS — N390 Urinary tract infection, site not specified: Secondary | ICD-10-CM | POA: Diagnosis not present

## 2017-05-01 DIAGNOSIS — N952 Postmenopausal atrophic vaginitis: Secondary | ICD-10-CM | POA: Diagnosis not present

## 2017-05-01 DIAGNOSIS — N3000 Acute cystitis without hematuria: Secondary | ICD-10-CM | POA: Diagnosis not present

## 2017-05-22 DIAGNOSIS — N952 Postmenopausal atrophic vaginitis: Secondary | ICD-10-CM | POA: Diagnosis not present

## 2017-05-22 DIAGNOSIS — N390 Urinary tract infection, site not specified: Secondary | ICD-10-CM | POA: Diagnosis not present

## 2017-05-26 DIAGNOSIS — E119 Type 2 diabetes mellitus without complications: Secondary | ICD-10-CM | POA: Diagnosis not present

## 2017-05-26 DIAGNOSIS — I10 Essential (primary) hypertension: Secondary | ICD-10-CM | POA: Diagnosis not present

## 2017-05-30 ENCOUNTER — Other Ambulatory Visit: Payer: Self-pay | Admitting: Internal Medicine

## 2017-06-16 DIAGNOSIS — Z1231 Encounter for screening mammogram for malignant neoplasm of breast: Secondary | ICD-10-CM | POA: Diagnosis not present

## 2017-06-25 DIAGNOSIS — E113393 Type 2 diabetes mellitus with moderate nonproliferative diabetic retinopathy without macular edema, bilateral: Secondary | ICD-10-CM | POA: Diagnosis not present

## 2017-07-02 DIAGNOSIS — I10 Essential (primary) hypertension: Secondary | ICD-10-CM | POA: Diagnosis not present

## 2017-07-02 DIAGNOSIS — E119 Type 2 diabetes mellitus without complications: Secondary | ICD-10-CM | POA: Diagnosis not present

## 2017-07-17 DIAGNOSIS — Z299 Encounter for prophylactic measures, unspecified: Secondary | ICD-10-CM | POA: Diagnosis not present

## 2017-07-17 DIAGNOSIS — I10 Essential (primary) hypertension: Secondary | ICD-10-CM | POA: Diagnosis not present

## 2017-07-17 DIAGNOSIS — L820 Inflamed seborrheic keratosis: Secondary | ICD-10-CM | POA: Diagnosis not present

## 2017-07-17 DIAGNOSIS — Z6831 Body mass index (BMI) 31.0-31.9, adult: Secondary | ICD-10-CM | POA: Diagnosis not present

## 2017-08-04 DIAGNOSIS — I10 Essential (primary) hypertension: Secondary | ICD-10-CM | POA: Diagnosis not present

## 2017-08-04 DIAGNOSIS — E119 Type 2 diabetes mellitus without complications: Secondary | ICD-10-CM | POA: Diagnosis not present

## 2017-08-14 DIAGNOSIS — N390 Urinary tract infection, site not specified: Secondary | ICD-10-CM | POA: Diagnosis not present

## 2017-09-11 DIAGNOSIS — N390 Urinary tract infection, site not specified: Secondary | ICD-10-CM | POA: Diagnosis not present

## 2017-10-01 DIAGNOSIS — E119 Type 2 diabetes mellitus without complications: Secondary | ICD-10-CM | POA: Diagnosis not present

## 2017-10-01 DIAGNOSIS — I10 Essential (primary) hypertension: Secondary | ICD-10-CM | POA: Diagnosis not present

## 2017-10-27 DIAGNOSIS — I10 Essential (primary) hypertension: Secondary | ICD-10-CM | POA: Diagnosis not present

## 2017-10-27 DIAGNOSIS — E1165 Type 2 diabetes mellitus with hyperglycemia: Secondary | ICD-10-CM | POA: Diagnosis not present

## 2017-10-27 DIAGNOSIS — B370 Candidal stomatitis: Secondary | ICD-10-CM | POA: Diagnosis not present

## 2017-10-27 DIAGNOSIS — I2699 Other pulmonary embolism without acute cor pulmonale: Secondary | ICD-10-CM | POA: Diagnosis not present

## 2017-10-27 DIAGNOSIS — Z87891 Personal history of nicotine dependence: Secondary | ICD-10-CM | POA: Diagnosis not present

## 2017-10-27 DIAGNOSIS — F321 Major depressive disorder, single episode, moderate: Secondary | ICD-10-CM | POA: Diagnosis not present

## 2017-10-27 DIAGNOSIS — Z6831 Body mass index (BMI) 31.0-31.9, adult: Secondary | ICD-10-CM | POA: Diagnosis not present

## 2017-10-27 DIAGNOSIS — Z299 Encounter for prophylactic measures, unspecified: Secondary | ICD-10-CM | POA: Diagnosis not present

## 2017-11-26 DIAGNOSIS — E119 Type 2 diabetes mellitus without complications: Secondary | ICD-10-CM | POA: Diagnosis not present

## 2017-11-26 DIAGNOSIS — I10 Essential (primary) hypertension: Secondary | ICD-10-CM | POA: Diagnosis not present

## 2017-12-29 DIAGNOSIS — I10 Essential (primary) hypertension: Secondary | ICD-10-CM | POA: Diagnosis not present

## 2017-12-29 DIAGNOSIS — N390 Urinary tract infection, site not specified: Secondary | ICD-10-CM | POA: Diagnosis not present

## 2017-12-29 DIAGNOSIS — R32 Unspecified urinary incontinence: Secondary | ICD-10-CM | POA: Diagnosis not present

## 2017-12-29 DIAGNOSIS — Z299 Encounter for prophylactic measures, unspecified: Secondary | ICD-10-CM | POA: Diagnosis not present

## 2017-12-29 DIAGNOSIS — M79606 Pain in leg, unspecified: Secondary | ICD-10-CM | POA: Diagnosis not present

## 2017-12-29 DIAGNOSIS — Z6830 Body mass index (BMI) 30.0-30.9, adult: Secondary | ICD-10-CM | POA: Diagnosis not present

## 2017-12-30 DIAGNOSIS — E119 Type 2 diabetes mellitus without complications: Secondary | ICD-10-CM | POA: Diagnosis not present

## 2017-12-30 DIAGNOSIS — I10 Essential (primary) hypertension: Secondary | ICD-10-CM | POA: Diagnosis not present

## 2018-01-20 DIAGNOSIS — Z23 Encounter for immunization: Secondary | ICD-10-CM | POA: Diagnosis not present

## 2018-01-20 DIAGNOSIS — Z299 Encounter for prophylactic measures, unspecified: Secondary | ICD-10-CM | POA: Diagnosis not present

## 2018-01-20 DIAGNOSIS — R531 Weakness: Secondary | ICD-10-CM | POA: Diagnosis not present

## 2018-01-20 DIAGNOSIS — N390 Urinary tract infection, site not specified: Secondary | ICD-10-CM | POA: Diagnosis not present

## 2018-01-20 DIAGNOSIS — R42 Dizziness and giddiness: Secondary | ICD-10-CM | POA: Diagnosis not present

## 2018-01-20 DIAGNOSIS — L304 Erythema intertrigo: Secondary | ICD-10-CM | POA: Diagnosis not present

## 2018-01-20 DIAGNOSIS — Z6830 Body mass index (BMI) 30.0-30.9, adult: Secondary | ICD-10-CM | POA: Diagnosis not present

## 2018-01-20 DIAGNOSIS — I10 Essential (primary) hypertension: Secondary | ICD-10-CM | POA: Diagnosis not present

## 2018-01-22 DIAGNOSIS — M549 Dorsalgia, unspecified: Secondary | ICD-10-CM | POA: Diagnosis not present

## 2018-01-22 DIAGNOSIS — I959 Hypotension, unspecified: Secondary | ICD-10-CM | POA: Diagnosis not present

## 2018-01-22 DIAGNOSIS — M5136 Other intervertebral disc degeneration, lumbar region: Secondary | ICD-10-CM | POA: Diagnosis not present

## 2018-01-22 DIAGNOSIS — M810 Age-related osteoporosis without current pathological fracture: Secondary | ICD-10-CM | POA: Diagnosis not present

## 2018-01-22 DIAGNOSIS — S22080A Wedge compression fracture of T11-T12 vertebra, initial encounter for closed fracture: Secondary | ICD-10-CM | POA: Diagnosis not present

## 2018-01-22 DIAGNOSIS — I10 Essential (primary) hypertension: Secondary | ICD-10-CM | POA: Diagnosis present

## 2018-01-22 DIAGNOSIS — M5489 Other dorsalgia: Secondary | ICD-10-CM | POA: Diagnosis not present

## 2018-01-22 DIAGNOSIS — R000 Tachycardia, unspecified: Secondary | ICD-10-CM | POA: Diagnosis not present

## 2018-01-22 DIAGNOSIS — Z86711 Personal history of pulmonary embolism: Secondary | ICD-10-CM | POA: Diagnosis not present

## 2018-01-22 DIAGNOSIS — Z79899 Other long term (current) drug therapy: Secondary | ICD-10-CM | POA: Diagnosis not present

## 2018-01-22 DIAGNOSIS — Z8673 Personal history of transient ischemic attack (TIA), and cerebral infarction without residual deficits: Secondary | ICD-10-CM | POA: Diagnosis not present

## 2018-01-22 DIAGNOSIS — B373 Candidiasis of vulva and vagina: Secondary | ICD-10-CM | POA: Diagnosis not present

## 2018-01-22 DIAGNOSIS — R0689 Other abnormalities of breathing: Secondary | ICD-10-CM | POA: Diagnosis not present

## 2018-01-22 DIAGNOSIS — K219 Gastro-esophageal reflux disease without esophagitis: Secondary | ICD-10-CM | POA: Diagnosis present

## 2018-01-22 DIAGNOSIS — Z9119 Patient's noncompliance with other medical treatment and regimen: Secondary | ICD-10-CM | POA: Diagnosis not present

## 2018-01-22 DIAGNOSIS — W19XXXA Unspecified fall, initial encounter: Secondary | ICD-10-CM | POA: Diagnosis not present

## 2018-01-22 DIAGNOSIS — R531 Weakness: Secondary | ICD-10-CM | POA: Diagnosis not present

## 2018-01-22 DIAGNOSIS — S22089A Unspecified fracture of T11-T12 vertebra, initial encounter for closed fracture: Secondary | ICD-10-CM | POA: Diagnosis not present

## 2018-01-22 DIAGNOSIS — F329 Major depressive disorder, single episode, unspecified: Secondary | ICD-10-CM | POA: Diagnosis present

## 2018-01-22 DIAGNOSIS — Z794 Long term (current) use of insulin: Secondary | ICD-10-CM | POA: Diagnosis not present

## 2018-01-22 DIAGNOSIS — N390 Urinary tract infection, site not specified: Secondary | ICD-10-CM | POA: Diagnosis not present

## 2018-01-22 DIAGNOSIS — S3992XA Unspecified injury of lower back, initial encounter: Secondary | ICD-10-CM | POA: Diagnosis not present

## 2018-01-22 DIAGNOSIS — E1140 Type 2 diabetes mellitus with diabetic neuropathy, unspecified: Secondary | ICD-10-CM | POA: Diagnosis not present

## 2018-01-23 ENCOUNTER — Inpatient Hospital Stay (HOSPITAL_COMMUNITY)
Admission: AD | Admit: 2018-01-23 | Discharge: 2018-01-27 | DRG: 552 | Disposition: A | Discharge status: Skilled Nursing Facility | Payer: Medicare Other | Source: Other Acute Inpatient Hospital | Attending: Internal Medicine | Admitting: Internal Medicine

## 2018-01-23 ENCOUNTER — Inpatient Hospital Stay (HOSPITAL_COMMUNITY): Payer: Medicare Other

## 2018-01-23 DIAGNOSIS — R410 Disorientation, unspecified: Secondary | ICD-10-CM | POA: Diagnosis present

## 2018-01-23 DIAGNOSIS — Z86718 Personal history of other venous thrombosis and embolism: Secondary | ICD-10-CM | POA: Diagnosis not present

## 2018-01-23 DIAGNOSIS — W1830XA Fall on same level, unspecified, initial encounter: Secondary | ICD-10-CM | POA: Diagnosis present

## 2018-01-23 DIAGNOSIS — B3741 Candidal cystitis and urethritis: Secondary | ICD-10-CM | POA: Diagnosis not present

## 2018-01-23 DIAGNOSIS — I82502 Chronic embolism and thrombosis of unspecified deep veins of left lower extremity: Secondary | ICD-10-CM | POA: Diagnosis not present

## 2018-01-23 DIAGNOSIS — M6281 Muscle weakness (generalized): Secondary | ICD-10-CM | POA: Diagnosis not present

## 2018-01-23 DIAGNOSIS — Z7901 Long term (current) use of anticoagulants: Secondary | ICD-10-CM

## 2018-01-23 DIAGNOSIS — S22080A Wedge compression fracture of T11-T12 vertebra, initial encounter for closed fracture: Secondary | ICD-10-CM

## 2018-01-23 DIAGNOSIS — I110 Hypertensive heart disease with heart failure: Secondary | ICD-10-CM | POA: Diagnosis present

## 2018-01-23 DIAGNOSIS — Z9181 History of falling: Secondary | ICD-10-CM

## 2018-01-23 DIAGNOSIS — R0602 Shortness of breath: Secondary | ICD-10-CM | POA: Diagnosis not present

## 2018-01-23 DIAGNOSIS — Z7401 Bed confinement status: Secondary | ICD-10-CM | POA: Diagnosis not present

## 2018-01-23 DIAGNOSIS — F0150 Vascular dementia without behavioral disturbance: Secondary | ICD-10-CM | POA: Diagnosis not present

## 2018-01-23 DIAGNOSIS — E1140 Type 2 diabetes mellitus with diabetic neuropathy, unspecified: Secondary | ICD-10-CM | POA: Diagnosis present

## 2018-01-23 DIAGNOSIS — Z888 Allergy status to other drugs, medicaments and biological substances status: Secondary | ICD-10-CM | POA: Diagnosis not present

## 2018-01-23 DIAGNOSIS — R2689 Other abnormalities of gait and mobility: Secondary | ICD-10-CM | POA: Diagnosis not present

## 2018-01-23 DIAGNOSIS — M4804 Spinal stenosis, thoracic region: Secondary | ICD-10-CM | POA: Diagnosis present

## 2018-01-23 DIAGNOSIS — F0390 Unspecified dementia without behavioral disturbance: Secondary | ICD-10-CM | POA: Diagnosis present

## 2018-01-23 DIAGNOSIS — S22089A Unspecified fracture of T11-T12 vertebra, initial encounter for closed fracture: Principal | ICD-10-CM | POA: Diagnosis present

## 2018-01-23 DIAGNOSIS — R296 Repeated falls: Secondary | ICD-10-CM | POA: Diagnosis present

## 2018-01-23 DIAGNOSIS — F329 Major depressive disorder, single episode, unspecified: Secondary | ICD-10-CM | POA: Diagnosis present

## 2018-01-23 DIAGNOSIS — Z794 Long term (current) use of insulin: Secondary | ICD-10-CM | POA: Diagnosis not present

## 2018-01-23 DIAGNOSIS — G9340 Encephalopathy, unspecified: Secondary | ICD-10-CM | POA: Diagnosis present

## 2018-01-23 DIAGNOSIS — I82402 Acute embolism and thrombosis of unspecified deep veins of left lower extremity: Secondary | ICD-10-CM | POA: Diagnosis not present

## 2018-01-23 DIAGNOSIS — I340 Nonrheumatic mitral (valve) insufficiency: Secondary | ICD-10-CM | POA: Diagnosis present

## 2018-01-23 DIAGNOSIS — M810 Age-related osteoporosis without current pathological fracture: Secondary | ICD-10-CM | POA: Diagnosis not present

## 2018-01-23 DIAGNOSIS — Z79899 Other long term (current) drug therapy: Secondary | ICD-10-CM | POA: Diagnosis not present

## 2018-01-23 DIAGNOSIS — E118 Type 2 diabetes mellitus with unspecified complications: Secondary | ICD-10-CM | POA: Diagnosis not present

## 2018-01-23 DIAGNOSIS — G9341 Metabolic encephalopathy: Secondary | ICD-10-CM | POA: Diagnosis not present

## 2018-01-23 DIAGNOSIS — I825Z9 Chronic embolism and thrombosis of unspecified deep veins of unspecified distal lower extremity: Secondary | ICD-10-CM | POA: Diagnosis not present

## 2018-01-23 DIAGNOSIS — N390 Urinary tract infection, site not specified: Secondary | ICD-10-CM | POA: Diagnosis not present

## 2018-01-23 DIAGNOSIS — Z86711 Personal history of pulmonary embolism: Secondary | ICD-10-CM | POA: Diagnosis not present

## 2018-01-23 DIAGNOSIS — M48061 Spinal stenosis, lumbar region without neurogenic claudication: Secondary | ICD-10-CM | POA: Diagnosis not present

## 2018-01-23 DIAGNOSIS — IMO0002 Reserved for concepts with insufficient information to code with codable children: Secondary | ICD-10-CM

## 2018-01-23 DIAGNOSIS — S22081D Stable burst fracture of T11-T12 vertebra, subsequent encounter for fracture with routine healing: Secondary | ICD-10-CM | POA: Diagnosis not present

## 2018-01-23 DIAGNOSIS — R41841 Cognitive communication deficit: Secondary | ICD-10-CM | POA: Diagnosis not present

## 2018-01-23 DIAGNOSIS — I10 Essential (primary) hypertension: Secondary | ICD-10-CM | POA: Diagnosis not present

## 2018-01-23 DIAGNOSIS — M4850XA Collapsed vertebra, not elsewhere classified, site unspecified, initial encounter for fracture: Secondary | ICD-10-CM | POA: Diagnosis present

## 2018-01-23 DIAGNOSIS — E876 Hypokalemia: Secondary | ICD-10-CM | POA: Diagnosis not present

## 2018-01-23 DIAGNOSIS — W19XXXA Unspecified fall, initial encounter: Secondary | ICD-10-CM | POA: Diagnosis not present

## 2018-01-23 DIAGNOSIS — E119 Type 2 diabetes mellitus without complications: Secondary | ICD-10-CM | POA: Diagnosis not present

## 2018-01-23 DIAGNOSIS — R002 Palpitations: Secondary | ICD-10-CM | POA: Diagnosis present

## 2018-01-23 DIAGNOSIS — M2550 Pain in unspecified joint: Secondary | ICD-10-CM | POA: Diagnosis not present

## 2018-01-23 DIAGNOSIS — E1165 Type 2 diabetes mellitus with hyperglycemia: Secondary | ICD-10-CM | POA: Diagnosis not present

## 2018-01-23 HISTORY — DX: Type 2 diabetes mellitus without complications: E11.9

## 2018-01-23 HISTORY — DX: Depression, unspecified: F32.A

## 2018-01-23 HISTORY — DX: Major depressive disorder, single episode, unspecified: F32.9

## 2018-01-23 HISTORY — DX: Unspecified atrial fibrillation: I48.91

## 2018-01-23 HISTORY — DX: Cerebral infarction, unspecified: I63.9

## 2018-01-23 HISTORY — DX: Anxiety disorder, unspecified: F41.9

## 2018-01-23 HISTORY — DX: Migraine, unspecified, not intractable, without status migrainosus: G43.909

## 2018-01-23 LAB — TSH: TSH: 1.166 u[IU]/mL (ref 0.350–4.500)

## 2018-01-23 LAB — BASIC METABOLIC PANEL
Anion gap: 11 (ref 5–15)
BUN: 16 mg/dL (ref 8–23)
CHLORIDE: 100 mmol/L (ref 98–111)
CO2: 25 mmol/L (ref 22–32)
Calcium: 8.3 mg/dL — ABNORMAL LOW (ref 8.9–10.3)
Creatinine, Ser: 0.75 mg/dL (ref 0.44–1.00)
GFR calc Af Amer: >60 mL/min (ref >60)
GFR calc non Af Amer: >60 mL/min (ref >60)
GLUCOSE: 205 mg/dL — AB (ref 70–99)
Potassium: 3.6 mmol/L (ref 3.5–5.1)
Sodium: 136 mmol/L (ref 135–145)

## 2018-01-23 LAB — CBC
HCT: 34.6 % — ABNORMAL LOW (ref 36.0–46.0)
Hemoglobin: 11 g/dL — ABNORMAL LOW (ref 12.0–15.0)
MCH: 27.4 pg (ref 26.0–34.0)
MCHC: 31.8 g/dL (ref 30.0–36.0)
MCV: 86.1 fL (ref 78.0–100.0)
Platelets: 141 10*3/uL — ABNORMAL LOW (ref 150–400)
RBC: 4.02 MIL/uL (ref 3.87–5.11)
RDW: 14.1 % (ref 11.5–15.5)
WBC: 7.2 10*3/uL (ref 4.0–10.5)

## 2018-01-23 LAB — GLUCOSE, CAPILLARY: GLUCOSE-CAPILLARY: 226 mg/dL — AB (ref 70–99)

## 2018-01-23 LAB — PROTIME-INR
INR: 1.07
Prothrombin Time: 13.8 seconds (ref 11.4–15.2)

## 2018-01-23 LAB — ABO/RH: ABO/RH(D): B NEG

## 2018-01-23 MED ORDER — METOPROLOL TARTRATE 5 MG/5ML IV SOLN: 5.0000 mg | INTRAVENOUS | Status: DC | PRN

## 2018-01-23 MED ORDER — METOPROLOL SUCCINATE ER 50 MG PO TB24
50.0000 mg | ORAL_TABLET | Freq: Two times a day (BID) | ORAL | Status: DC
  Administered 2018-01-23 – 2018-01-27 (×9): 50 mg via ORAL
  Filled 2018-01-23 (×9): qty 1

## 2018-01-23 MED ORDER — VITAMIN D 1000 UNITS PO TABS
1000.0000 [IU] | ORAL_TABLET | Freq: Every day | ORAL | Status: DC
  Administered 2018-01-24 – 2018-01-27 (×4): 1000 [IU] via ORAL
  Filled 2018-01-23 (×5): qty 1

## 2018-01-23 MED ORDER — NYSTATIN 100000 UNIT/GM EX POWD
Freq: Three times a day (TID) | CUTANEOUS | Status: DC
  Administered 2018-01-23 – 2018-01-27 (×11): via TOPICAL
  Filled 2018-01-23: qty 15

## 2018-01-23 MED ORDER — INSULIN ASPART 100 UNIT/ML ~~LOC~~ SOLN
0.0000 [IU] | Freq: Every day | SUBCUTANEOUS | Status: DC
  Administered 2018-01-23 – 2018-01-25 (×2): 2 [IU] via SUBCUTANEOUS

## 2018-01-23 MED ORDER — HEPARIN SODIUM (PORCINE) 5000 UNIT/ML IJ SOLN
5000.0000 [IU] | Freq: Three times a day (TID) | INTRAMUSCULAR | Status: DC
  Administered 2018-01-24 – 2018-01-26 (×5): 5000 [IU] via SUBCUTANEOUS
  Filled 2018-01-23 (×7): qty 1

## 2018-01-23 MED ORDER — DULOXETINE HCL 60 MG PO CPEP
60.0000 mg | ORAL_CAPSULE | Freq: Every day | ORAL | Status: DC
  Administered 2018-01-24 – 2018-01-27 (×4): 60 mg via ORAL
  Filled 2018-01-23 (×4): qty 1

## 2018-01-23 MED ORDER — ACETAMINOPHEN 650 MG RE SUPP: 650.0000 mg | Freq: Four times a day (QID) | RECTAL | Status: DC | PRN

## 2018-01-23 MED ORDER — INSULIN GLARGINE 100 UNIT/ML ~~LOC~~ SOLN
38.0000 [IU] | SUBCUTANEOUS | Status: DC
  Administered 2018-01-24 – 2018-01-26 (×3): 38 [IU] via SUBCUTANEOUS
  Filled 2018-01-23 (×3): qty 0.38

## 2018-01-23 MED ORDER — MAGNESIUM CITRATE PO SOLN: 1.0000 | Freq: Once | ORAL | Status: DC | PRN

## 2018-01-23 MED ORDER — ACETAMINOPHEN 325 MG PO TABS
650.0000 mg | ORAL_TABLET | Freq: Four times a day (QID) | ORAL | Status: DC | PRN
  Administered 2018-01-24: 650 mg via ORAL
  Filled 2018-01-23: qty 2

## 2018-01-23 MED ORDER — AMLODIPINE BESYLATE 5 MG PO TABS
5.0000 mg | ORAL_TABLET | Freq: Every day | ORAL | Status: DC
  Administered 2018-01-24 – 2018-01-27 (×4): 5 mg via ORAL
  Filled 2018-01-23 (×4): qty 1

## 2018-01-23 MED ORDER — TRAMADOL HCL 50 MG PO TABS
50.0000 mg | ORAL_TABLET | Freq: Four times a day (QID) | ORAL | Status: DC | PRN
  Administered 2018-01-24 (×2): 50 mg via ORAL
  Filled 2018-01-23 (×2): qty 1

## 2018-01-23 MED ORDER — PANTOPRAZOLE SODIUM 40 MG PO TBEC
40.0000 mg | DELAYED_RELEASE_TABLET | Freq: Every day | ORAL | Status: DC
  Administered 2018-01-23 – 2018-01-27 (×5): 40 mg via ORAL
  Filled 2018-01-23 (×5): qty 1

## 2018-01-23 MED ORDER — HYDRALAZINE HCL 20 MG/ML IJ SOLN
10.0000 mg | Freq: Four times a day (QID) | INTRAMUSCULAR | Status: DC | PRN
  Administered 2018-01-24: 10 mg via INTRAVENOUS
  Filled 2018-01-23: qty 1

## 2018-01-23 MED ORDER — INSULIN ASPART 100 UNIT/ML ~~LOC~~ SOLN
0.0000 [IU] | Freq: Three times a day (TID) | SUBCUTANEOUS | Status: DC
  Administered 2018-01-24 (×2): 2 [IU] via SUBCUTANEOUS
  Administered 2018-01-24: 5 [IU] via SUBCUTANEOUS
  Administered 2018-01-25: 3 [IU] via SUBCUTANEOUS
  Administered 2018-01-25: 5 [IU] via SUBCUTANEOUS
  Administered 2018-01-25: 1 [IU] via SUBCUTANEOUS
  Administered 2018-01-26 (×2): 2 [IU] via SUBCUTANEOUS
  Administered 2018-01-27 (×2): 5 [IU] via SUBCUTANEOUS
  Administered 2018-01-27: 2 [IU] via SUBCUTANEOUS

## 2018-01-23 NOTE — H&P (Signed)
TRH H&P   Patient Demographics:    Sandra [name], is a 77 y.o. female  MRN: [id number]   DOB - 1941-02-12  Admit Date - 01/23/2018  Outpatient Primary MD for the patient is Glenda Chroman, MD    Patient coming from: Home  Chief complaint.  Fall with back fracture.   HPI:    Sandra [name]  is a 77 y.o. 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 Venetia Constable who requested a transfer to [facility] with hospitalist admission.  Currently besides dull low back pain which is currently nonradiating worse with movement better with rest, ongoing for 2 to 3 days since her fall, no other associated symptoms, is symptom-free.   Review of systems:    A full 10 point Review of Systems was done, except as stated above, all other Review of Systems were negative.   With Past History of the following :    Past Medical History:  Diagnosis Date  . Arthritis   . Diabetes mellitus   . DVT (deep venous thrombosis), left 04/2011   after an ankle fracture.   . Hypertension   . Mitral regurgitation 07/2011   moderate  . Pulmonary embolism ([place]) 04/2011      Past Surgical History:  Procedure Laterality  Date  . ABDOMINAL HYSTERECTOMY    . CATARACT EXTRACTION    . COLONOSCOPY  05/07/2012   Procedure: COLONOSCOPY;  Surgeon: Rogene Houston, MD;  Location: [facility];  Service: Endoscopy;  Laterality: N/A;  245  . FOOT SURGERY    . HERNIA REPAIR    . TONSILLECTOMY        Social History:     Social History   Tobacco Use  . Smoking status: Never Smoker  . Smokeless tobacco: Never Used  Substance Use Topics  . Alcohol use:  No         Family History :     Family History  Problem Relation Age of Onset  . Hypertension Mother   . Diabetes Mother   . Heart attack Mother        Home Medications:   Prior to Admission medications   Medication Sig Start Date End Date Taking? Authorizing Provider  amLODipine (NORVASC) 5 MG tablet Take 5 mg by mouth daily.     [provider]  Cholecalciferol (VITAMIN D-3) 1000 UNITS CAPS Take 1,000 Units by mouth daily.    [provider]  diazepam (VALIUM) 2 MG tablet Take 2 mg by mouth daily. 08/24/16   [provider]  DULoxetine (CYMBALTA) 60 MG capsule  02/16/16   [provider]  hydrochlorothiazide (HYDRODIURIL) 12.5 MG tablet Take 12.5 mg by mouth daily.  11/14/14   [provider]  Insulin Glargine (TOUJEO SOLOSTAR) 300 UNIT/ML SOPN Inject 38 Units into the skin every morning. 10/22/16   Philemon Kingdom, MD  insulin lispro (HUMALOG KWIKPEN) 100 UNIT/ML KiwkPen Inject 0.16-0.2 mLs (16-20 Units total) into the skin 3 (three) times daily. 01/08/16   Philemon Kingdom, MD  metFORMIN (GLUCOPHAGE) 500 MG tablet TAKE 1 TABLET BY MOUTH  TWICE A DAY 10/08/16   Philemon Kingdom, MD  metoprolol succinate (TOPROL-XL) 50 MG 24 hr tablet Take 1 tablet (50 mg total) by mouth daily. Patient taking differently: Take 150 mg by mouth daily.  04/28/12 08/27/16  Wellington Hampshire, MD  metoprolol succinate (TOPROL-XL) 50 MG 24 hr tablet  10/09/15   [provider]  pantoprazole (PROTONIX) 40 MG tablet TAKE ONE  TABLET DAILY 12/16/12   Wellington Hampshire, MD  warfarin (COUMADIN) 5 MG tablet Take as directed by the coumadin clinic 08/24/16   [provider]     Allergies:     Allergies  Allergen Reactions  . Buprenex [Buprenorphine] Nausea And Vomiting  . Namenda [Memantine Hcl] Other (See Comments)    Sores in the mouth  . Other Other (See Comments)    Shot for pain caused severe nausea. Had to spend the night in the hospital. Shot to numb mouth at the dentist = made her feel like she was smothering.      Physical Exam:   Vitals  There were no vitals taken for this visit.   1. General Pleasant obese elderly Caucasian female lying in hospital bed in no apparent distress,  2. Normal affect and insight, Not Suicidal or Homicidal, Awake Alert, Oriented X 3.  3. No F.N deficits, ALL C.Nerves Intact, Strength 5/5 all 4 extremities, Sensation intact all 4 extremities, Plantars down going.  4. Ears and Eyes appear Normal, Conjunctivae clear, PERRLA. Moist Oral Mucosa.  5. Supple Neck, No JVD, No cervical lymphadenopathy appriciated, No Carotid Bruits.  6. Symmetrical Chest wall movement, Good air movement bilaterally, CTAB.  7. RRR, No Gallops, Rubs or Murmurs, No Parasternal Heave.  8. Positive Bowel Sounds, Abdomen Soft, No tenderness, No organomegaly appriciated,No rebound -guarding or rigidity.  9.  No Cyanosis, Normal Skin Turgor, No Skin Rash or Bruise.  10. Good muscle tone,  joints appear normal , no effusions, Normal ROM.  11. No Palpable Lymph Nodes in Neck or Axillae      Data Review:    CBC No results for input(s): WBC, HGB, HCT, PLT, MCV, MCH, MCHC, RDW, LYMPHSABS, MONOABS, EOSABS, BASOSABS, BANDABS in the last 168 hours.  Invalid input(s): NEUTRABS, BANDSABD ------------------------------------------------------------------------------------------------------------------  Chemistries  No  results for input(s): NA, K, CL, CO2, GLUCOSE, BUN, CREATININE,  CALCIUM, MG, AST, ALT, ALKPHOS, BILITOT in the last 168 hours.  Invalid input(s): GFRCGP ------------------------------------------------------------------------------------------------------------------ CrCl cannot be calculated (Patient's most recent lab result is older than the maximum 21 days allowed.). ------------------------------------------------------------------------------------------------------------------ No results for input(s): TSH, T4TOTAL, T3FREE, THYROIDAB in the last 72 hours.  Invalid input(s): FREET3  Coagulation profile No results for input(s): INR, PROTIME in the last 168 hours. ------------------------------------------------------------------------------------------------------------------- No results for input(s): DDIMER in the last 72 hours. -------------------------------------------------------------------------------------------------------------------  Cardiac Enzymes No results for input(s): CKMB, TROPONINI, MYOGLOBIN in the last 168 hours.  Invalid input(s): CK ------------------------------------------------------------------------------------------------------------------ No results found for: BNP   ---------------------------------------------------------------------------------------------------------------  Urinalysis No results found for: COLORURINE, APPEARANCEUR, LABSPEC, [place], GLUCOSEU, [place], BILIRUBINUR, KETONESUR, PROTEINUR, UROBILINOGEN, NITRITE, LEUKOCYTESUR  ----------------------------------------------------------------------------------------------------------------   Imaging Results:    Baseline labs including INR, EKG and chest x-ray have been ordered   Assessment & Plan:      1.  Mechanical fall with T12 spine fracture with reported 40% height loss and 5 to 6 mm bulge towards the spine.  Currently neurologically intact, no bowel incontinence, chronic bladder incontinence.  Last dose of Xarelto 01/21/2018.  Currently plan  is pain control, bedrest until seen by neurosurgery, I have already informed neurosurgeon Dr. Venetia Constable who will come and evaluate the patient for possible surgical intervention if needed.  Cardio-Pulm Risk stratification for surgery and recommendations to minimize the same:-  Patient will be a moderate risk candidate for adverse cardiopulmonary outcome.  She understands the risks and accepts to go for surgery if it is needed.  Recommendations for optimizing Cardio-Pulmonary  Risk risk factors  1. Keep SBP<140, HR<85, use Lopressor 5mg  IV q4hrs PRN, or B.Blocker drip PRN. 2. Moniotr I&Os. 3. Minimal sedation and Narcotics. 4. Good pulmunary toilet. 5. PRN Nebs and as needed oxygen to keep Pox>90% 6. Hb>8, transfuse as needed- Lasix 10mg  IV after each unit PRBC Transfused.   B.Bleeding Risk - no previous surgical complications, no easy bruising,  Antiplate meds None, on Xarelto last dose 01/21/2018.   Will request Surgeon to please Order DVT prophylaxis of his/her choice, along with activity, weight bearing precautions and diet if appropriate.      2.  DM type II.  Continue home dose long-acting insulin, add sliding scale and check A1c.  3.  Essential hypertension.  Continue home dose Norvasc and beta-blocker.  PRN hydralazine and Lopressor IV will be added.  4.  History of DVT/PE.  More than 3 years ago.  Xarelto currently on hold.  For now prophylactic dose heparin.  In case no surgery is recommended kindly commence anticoagulation ASAP.  5.  Poor balance and multiple falls over the last several months.  PT evaluation once cleared by neurosurgery, may require placement.   DVT Prophylaxis Heparin    AM Labs Ordered, also please review Full Orders  Family Communication: Admission, patients condition and plan of care including tests being ordered have been discussed with the patient who indicates understanding and agree with the plan and Code Status.  Code Status Full  Likely DC  to  TBD  Condition GUARDED    Consults called: N.Surg    Admission status: Inpt    Time spent in minutes : 35   Lala Lund M.D on 01/23/2018 at 5:31 PM  To page go to [email or web address] - password [facility]

## 2018-01-24 ENCOUNTER — Encounter (HOSPITAL_COMMUNITY): Payer: Self-pay | Admitting: Emergency Medicine

## 2018-01-24 ENCOUNTER — Other Ambulatory Visit: Payer: Self-pay

## 2018-01-24 DIAGNOSIS — F0150 Vascular dementia without behavioral disturbance: Secondary | ICD-10-CM

## 2018-01-24 DIAGNOSIS — G9341 Metabolic encephalopathy: Secondary | ICD-10-CM

## 2018-01-24 LAB — CBC
HCT: 36.6 % (ref 36.0–46.0)
Hemoglobin: 11.5 g/dL — ABNORMAL LOW (ref 12.0–15.0)
MCH: 27.1 pg (ref 26.0–34.0)
MCHC: 31.4 g/dL (ref 30.0–36.0)
MCV: 86.3 fL (ref 78.0–100.0)
PLATELETS: 168 10*3/uL (ref 150–400)
RBC: 4.24 MIL/uL (ref 3.87–5.11)
RDW: 14.1 % (ref 11.5–15.5)
WBC: 9.2 10*3/uL (ref 4.0–10.5)

## 2018-01-24 LAB — BASIC METABOLIC PANEL
Anion gap: 11 (ref 5–15)
BUN: 12 mg/dL (ref 8–23)
CO2: 25 mmol/L (ref 22–32)
CREATININE: 0.67 mg/dL (ref 0.44–1.00)
Calcium: 8.3 mg/dL — ABNORMAL LOW (ref 8.9–10.3)
Chloride: 100 mmol/L (ref 98–111)
GFR calc Af Amer: >60 mL/min (ref >60)
GLUCOSE: 167 mg/dL — AB (ref 70–99)
Potassium: 3.5 mmol/L (ref 3.5–5.1)
SODIUM: 136 mmol/L (ref 135–145)

## 2018-01-24 LAB — GLUCOSE, CAPILLARY
GLUCOSE-CAPILLARY: 152 mg/dL — AB (ref 70–99)
Glucose-Capillary: 165 mg/dL — ABNORMAL HIGH (ref 70–99)
Glucose-Capillary: 295 mg/dL — ABNORMAL HIGH (ref 70–99)
Glucose-Capillary: 182 mg/dL — ABNORMAL HIGH (ref 70–99)

## 2018-01-24 LAB — MAGNESIUM: MAGNESIUM: 1.2 mg/dL — AB (ref 1.7–2.4)

## 2018-01-24 MED ORDER — MAGNESIUM SULFATE 2 GM/50ML IV SOLN
2.0000 g | Freq: Once | INTRAVENOUS | Status: AC
  Administered 2018-01-24: 2 g via INTRAVENOUS
  Filled 2018-01-24: qty 50

## 2018-01-24 MED ORDER — SENNOSIDES-DOCUSATE SODIUM 8.6-50 MG PO TABS
1.0000 | ORAL_TABLET | Freq: Two times a day (BID) | ORAL | Status: DC
  Administered 2018-01-24 – 2018-01-27 (×7): 1 via ORAL
  Filled 2018-01-24 (×7): qty 1

## 2018-01-24 MED ORDER — POTASSIUM CHLORIDE CRYS ER 20 MEQ PO TBCR
40.0000 meq | EXTENDED_RELEASE_TABLET | Freq: Once | ORAL | Status: AC
  Administered 2018-01-24: 40 meq via ORAL
  Filled 2018-01-24: qty 2

## 2018-01-24 NOTE — Progress Notes (Signed)
Orthopedic Tech Progress Note Patient Details:  Sandra [name] 03-14-1941 [id number]  Patient ID: Sandra [name], female   DOB: 02/24/1941, 77 y.o.   MRN: [id number]   Sandra [name] 01/24/2018, 5:05 PMCalled [employer] for TLSO brace.

## 2018-01-24 NOTE — Plan of Care (Signed)
  Problem: Education: Goal: Knowledge of General Education information will improve Description Including pain rating scale, medication(s)/side effects and non-pharmacologic comfort measures Outcome: Not Progressing Note:  POC reviewed with pt.; forgets she's in the hospital.

## 2018-01-24 NOTE — Progress Notes (Signed)
PROGRESS NOTE  Sandra [name] BOF:[id number] DOB: April 10, 1941 DOA: 01/23/2018 PCP: Glenda Chroman, MD  HPI/Recap of past 24 hours:  Confused, she states " how do you find me in this place", she knows this is 01/2018, she reports memory issues for the last two weeks since fall She has disorganized thoughts, hallucinating ,  History is not reliable  She c/o nagging back pain, denies acute bowel and bladder issues, able to move legs in bed  She reports Her doctor stopped her driving a few months ago due to her balance issue She lives by herself with family living in short distance  Family reports patient has been having memory issues for a year, she was started on nemanda in the past but could not tolerate, family reports patient is increasing confused in the last few weeks,  no fever  Assessment/Plan: Principal Problem:   Compressed spine fracture ([place]) Active Problems:   Left leg DVT ([place])   Mitral regurgitation   Palpitations   Uncontrolled type 2 diabetes mellitus with complication, with long-term current use of insulin ([place])  T12 spine fracture with reported 40% height loss and 5 to 6 mm bulge towards the spine.   -with h/o frequent falls -Currently neurologically intact, no bowel incontinence, chronic bladder incontinence.   -Last dose of Xarelto 01/21/2018.  -pain control, bedrest - neurosurgery consulted  Encephalopathy? With h/o dementia Family reports patient is more confused today , she is not at her baseline From narcotics? No fever, will get ua Frequent reorientation She is at risk of delirium, family understands   Hypokalemia/hypomagnesemia: Oral k supplement, iv mag supplement  Insulin dependent dm2 a1c in process Am blood glucose 167 Lantus/ssi  HTN: continue home meds toprol-xl/norvasc  H/o DVT/PE in 2013 after left ankle fracture, has been on xarelto , last dose on 10/2  Frequent falls/poor balance Diabetes neuropathy? Will need PT eval, may need  placement, patient lives alone  H/o depression Reports she is diagnosed with depression for 4 yrs Currently denies SI/HI    Code Status: full  Family Communication: patient and family at bedside  Disposition Plan: not ready to discharge, likely will need snf at discharge   Consultants:  neurosurgery  Procedures:  none  Antibiotics:  none   Objective: BP (!) 159/80 (BP Location: Left Arm)   Pulse 92   Temp 97.8 F (36.6 C) (Oral)   Resp 18   SpO2 98%   Intake/Output Summary (Last 24 hours) at 01/24/2018 0759 Last data filed at 01/24/2018 0700 Gross per 24 hour  Intake -  Output 250 ml  Net -250 ml   There were no vitals filed for this visit.  Exam: Patient is examined daily including today on 01/24/2018, exams remain the same as of yesterday except that has changed    General:  NAD, confused   Cardiovascular: RRR  Respiratory: CTABL  Abdomen: Soft/ND/NT, positive BS  Musculoskeletal: No Edema ,able to lift against gravity at 30degress, sensitive to touch? hyperalgesia  Neuro: alert, oriented to person, confused about time and place,   Data Reviewed: Basic Metabolic Panel: Recent Labs  Lab 01/23/18 1843 01/24/18 0523  NA 136 136  K 3.6 3.5  CL 100 100  CO2 25 25  GLUCOSE 205* 167*  BUN 16 12  CREATININE 0.75 0.67  CALCIUM 8.3* 8.3*  MG  --  1.2*   Liver Function Tests: No results for input(s): AST, ALT, ALKPHOS, BILITOT, PROT, ALBUMIN in the last 168 hours. No results for  input(s): LIPASE, AMYLASE in the last 168 hours. No results for input(s): AMMONIA in the last 168 hours. CBC: Recent Labs  Lab 01/23/18 1843 01/24/18 0523  WBC 7.2 9.2  HGB 11.0* 11.5*  HCT 34.6* 36.6  MCV 86.1 86.3  PLT 141* 168   Cardiac Enzymes:   No results for input(s): CKTOTAL, CKMB, CKMBINDEX, TROPONINI in the last 168 hours. BNP (last 3 results) No results for input(s): BNP in the last 8760 hours.  ProBNP (last 3 results) No results for input(s):  PROBNP in the last 8760 hours.  CBG: Recent Labs  Lab 01/23/18 2207 01/24/18 0735  GLUCAP 226* 165*    No results found for this or any previous visit (from the past 240 hour(s)).   Studies: Dg Chest Port 1 View  Result Date: 01/23/2018 CLINICAL DATA:  Multiple falls. Hypertension. Slight shortness of breath. EXAM: PORTABLE CHEST 1 VIEW COMPARISON:  06/03/2016 FINDINGS: 1757 hours. The lungs are clear without focal pneumonia, edema, pneumothorax or pleural effusion. The cardiopericardial silhouette is within normal limits for size. Distal left clavicle fracture noted. Bones are diffusely demineralized. IMPRESSION: No active disease. Electronically Signed   By: Misty Stanley M.D.   On: 01/23/2018 18:25    Scheduled Meds: . amLODipine  5 mg Oral Daily  . cholecalciferol  1,000 Units Oral Daily  . DULoxetine  60 mg Oral Daily  . heparin injection (subcutaneous)  5,000 Units Subcutaneous Q8H  . insulin aspart  0-5 Units Subcutaneous QHS  . insulin aspart  0-9 Units Subcutaneous TID WC  . insulin glargine  38 Units Subcutaneous BH-[id number]  . metoprolol succinate  50 mg Oral BID  . nystatin   Topical TID  . pantoprazole  40 mg Oral Daily  . potassium chloride  40 mEq Oral Once    Continuous Infusions: . magnesium sulfate 1 - 4 g bolus IVPB       Time spent: 35 mins I have personally reviewed and interpreted on  01/24/2018 daily labs, imagings as discussed above under date review session and assessment and plans.  I reviewed all nursing notes, pharmacy notes, consultant notes,  vitals, pertinent old records  I have discussed plan of care as described above with RN , patient and family on 01/24/2018   Florencia Reasons MD, PhD  [facility] Pager [phone]. If 7PM-7AM, please contact night-coverage at [email or web address], password [facility] 01/24/2018, 7:59 AM  LOS: 1 day

## 2018-01-24 NOTE — Consult Note (Signed)
Reason for Consult:T12 fracture Referring Physician: Medicine  Sandra [name] is an 77 y.o. female.  HPI: 77 year old female status post fall approximately 5 days ago and then a secondary fall with worsening of her symptoms 3 days ago..  Fall was mechanical and not related to syncope.  Patient with immediate onset of back pain.  No radiating lower extremity pain.  No new numbness, paresthesias or weakness.  Patient's overall functional status is poor.  She has extremely poor exercise tolerance.  She has active diabetes mellitus and some degree of congestive heart failure.  She has a remote history of pulmonary embolus and DVT.  She is chronically anticoagulated.  Prior to these falls she has a very severe history of chronic mechanical lower back pain.  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  Past Medical History:  Diagnosis Date  . Arthritis   . Diabetes mellitus   . DVT (deep venous thrombosis), left 04/2011   after an ankle fracture.   . Hypertension   . Mitral regurgitation 07/2011   moderate  . Pulmonary embolism ([place]) 04/2011    Past Surgical History:  Procedure Laterality Date  . ABDOMINAL HYSTERECTOMY    . CATARACT EXTRACTION    . COLONOSCOPY  05/07/2012   Procedure: COLONOSCOPY;  Surgeon: Rogene Houston, MD;  Location[facility];  Service: Endoscopy;  Laterality: N/A;  245  . FOOT SURGERY    . HERNIA REPAIR    . TONSILLECTOMY      Family History  Problem Relation Age of Onset  . Hypertension Mother   . Diabetes Mother   . Heart attack Mother     Social History:  reports that she has never smoked. She has never used smokeless tobacco. She reports that she does not drink alcohol or use drugs.  Allergies:  Allergies  Allergen Reactions  . Buprenex [Buprenorphine] Nausea And Vomiting  . Namenda [Memantine Hcl] Other (See Comments)    Sores in the mouth  . Other Other (See Comments)    Shot for pain caused severe nausea. Had to  spend the night in the hospital. Shot to numb mouth at the dentist = made her feel like she was smothering.     Medications: I have reviewed the patient's current medications.  Results for orders placed or performed during the hospital encounter of 01/23/18 (from the past 48 hour(s))  TSH     Status: None   Collection Time: 01/23/18  6:43 PM  Result Value Ref Range   TSH 1.166 0.350 - 4.500 uIU/mL    Comment: Performed by a 3rd Generation assay with a functional sensitivity of <=0.01 uIU/mL. Performed at [facility], [place] [address] [place], [place] [address]   CBC     Status: Abnormal   Collection Time: 01/23/18  6:43 PM  Result Value Ref Range   WBC 7.2 4.0 - 10.5 K/uL   RBC 4.02 3.87 - 5.11 MIL/uL   Hemoglobin 11.0 (L) 12.0 - 15.0 g/dL   HCT 34.6 (L) 36.0 - 46.0 %   MCV 86.1 78.0 - 100.0 fL   MCH 27.4 26.0 - 34.0 pg   MCHC 31.8 30.0 - 36.0 g/dL   RDW 14.1 11.5 - 15.5 %   Platelets 141 (L) 150 - 400 K/uL    Comment: Performed at [facility], [place] [address] [place], [place] [address]  Basic metabolic panel     Status: Abnormal   Collection Time: 01/23/18  6:43 PM  Result Value Ref Range   Sodium 136 135 - 145 mmol/L   Potassium 3.6 3.5 - 5.1 mmol/L   Chloride 100 98 - 111 mmol/L   CO2 25 22 - 32 mmol/L   Glucose, Bld 205 (H) 70 - 99 mg/dL   BUN 16 8 - 23 mg/dL   Creatinine, Ser 0.75 0.44 - 1.00 mg/dL   Calcium 8.3 (L) 8.9 - 10.3 mg/dL   GFR calc non Af Amer >60 >60 mL/min   GFR calc Af Amer >60 >60 mL/min    Comment: (NOTE) The eGFR has been calculated using the CKD EPI equation. This calculation has not been validated in all clinical situations. eGFR's persistently <60 mL/min signify possible Chronic Kidney Disease.    Anion gap 11 5 - 15    Comment: Performed at [facility] [address], [place], [place] [address]  Protime-INR     Status: None   Collection Time: 01/23/18  6:43 PM  Result Value Ref Range   Prothrombin Time 13.8 11.4 - 15.2  seconds   INR 1.07     Comment: Performed at [facility] [address], [place], [place] [address]  Type and screen [place]     Status: None   Collection Time: 01/23/18  6:43 PM  Result Value Ref Range   ABO/RH(D) B NEG    Antibody Screen NEG    Sample Expiration      01/26/2018 Performed at [facility], [place] [address], [place], [place] [address]   ABO/Rh     Status: None   Collection Time: 01/23/18  6:43 PM  Result Value Ref Range   ABO/RH(D)      B NEG Performed at [facility] [address], [place], [place] [address]   Glucose, capillary     Status: Abnormal   Collection Time: 01/23/18 10:07 PM  Result Value Ref Range   Glucose-Capillary 226 (H) 70 - 99 mg/dL  CBC     Status: Abnormal   Collection Time: 01/24/18  5:23 AM  Result Value Ref Range   WBC 9.2 4.0 - 10.5 K/uL   RBC 4.24 3.87 - 5.11 MIL/uL   Hemoglobin 11.5 (L) 12.0 - 15.0 g/dL   HCT 36.6 36.0 - 46.0 %   MCV 86.3 78.0 - 100.0 fL   MCH 27.1 26.0 - 34.0 pg   MCHC 31.4 30.0 - 36.0 g/dL   RDW 14.1 11.5 - 15.5 %   Platelets 168 150 - 400 K/uL    Comment: Performed at [facility], [place] [address], [place],  [address]  Basic metabolic panel     Status: Abnormal   Collection Time: 01/24/18  5:23 AM  Result Value Ref Range   Sodium 136 135 - 145 mmol/L   Potassium 3.5 3.5 - 5.1 mmol/L   Chloride 100 98 - 111 mmol/L   CO2 25 22 - 32 mmol/L   Glucose, Bld 167 (H) 70 - 99 mg/dL   BUN 12 8 - 23 mg/dL   Creatinine, Ser 0.67 0.44 - 1.00 mg/dL   Calcium 8.3 (L) 8.9 - 10.3 mg/dL   GFR calc non Af Amer >60 >60 mL/min   GFR calc Af Amer >60 >60 mL/min    Comment: (NOTE) The eGFR has been calculated using the CKD EPI equation. This calculation has not been validated in all clinical situations. eGFR's persistently <60 mL/min signify possible Chronic Kidney Disease.  Anion gap 11 5 - 15    Comment: Performed at [facility] [address],  [place], [place] [address]  Magnesium     Status: Abnormal   Collection Time: 01/24/18  5:23 AM  Result Value Ref Range   Magnesium 1.2 (L) 1.7 - 2.4 mg/dL    Comment: Performed at [facility] [address], [place], [place] [address]  Glucose, capillary     Status: Abnormal   Collection Time: 01/24/18  7:35 AM  Result Value Ref Range   Glucose-Capillary 165 (H) 70 - 99 mg/dL    Dg Chest Port 1 View  Result Date: 01/23/2018 CLINICAL DATA:  Multiple falls. Hypertension. Slight shortness of breath. EXAM: PORTABLE CHEST 1 VIEW COMPARISON:  06/03/2016 FINDINGS: 1757 hours. The lungs are clear without focal pneumonia, edema, pneumothorax or pleural effusion. The cardiopericardial silhouette is within normal limits for size. Distal left clavicle fracture noted. Bones are diffusely demineralized. IMPRESSION: No active disease. Electronically Signed   By: Misty Stanley M.D.   On: 01/23/2018 18:25    Pertinent items noted in HPI and remainder of comprehensive ROS otherwise negative. Blood pressure (!) 159/80, pulse 92, temperature 97.8 F (36.6 C), temperature source Oral, resp. rate 18, SpO2 98 %. Patient is awake and alert.  She is oriented and reasonably appropriate.  She is overweight.  She appears moderately deconditioned.  Examination head ears eyes nose throat is unremarkable.  Chest and abdomen are obese but otherwise benign.  Extremities are free from injury.  Neurologically she is awake and alert.  Oriented and appropriate.  Cranial nerve function intact.  Motor and sensory function of her extremities intact.  Reflexes hypoactive but symmetric.  No evidence of long track signs.  Gait not tested.  Assessment/Plan: T12 fracture with some retropulsion and stenosis.  No evidence of spinal cord or cauda equina dysfunction.  Recommend trial in brace with mobilization with physical and occupational therapy.  I would get an MRI scan to fully assess her spinal canal.  Dr. Venetia Constable to assume care  on Monday.  Sandra [name] 01/24/2018, 9:31 AM

## 2018-01-25 ENCOUNTER — Encounter (HOSPITAL_COMMUNITY): Payer: Self-pay | Admitting: *Deleted

## 2018-01-25 ENCOUNTER — Inpatient Hospital Stay (HOSPITAL_COMMUNITY): Payer: Medicare Other

## 2018-01-25 LAB — TYPE AND SCREEN
ABO/RH(D): B NEG
Antibody Screen: NEGATIVE

## 2018-01-25 LAB — URINALYSIS, ROUTINE W REFLEX MICROSCOPIC
Bilirubin Urine: NEGATIVE
GLUCOSE, UA: 50 mg/dL — AB
Ketones, ur: 20 mg/dL — AB
NITRITE: NEGATIVE
Protein, ur: 30 mg/dL — AB
Specific Gravity, Urine: 1.017 (ref 1.005–1.030)
pH: 7 (ref 5.0–8.0)

## 2018-01-25 LAB — GLUCOSE, CAPILLARY
GLUCOSE-CAPILLARY: 232 mg/dL — AB (ref 70–99)
GLUCOSE-CAPILLARY: 245 mg/dL — AB (ref 70–99)
Glucose-Capillary: 138 mg/dL — ABNORMAL HIGH (ref 70–99)
Glucose-Capillary: 278 mg/dL — ABNORMAL HIGH (ref 70–99)

## 2018-01-25 LAB — BASIC METABOLIC PANEL
Anion gap: 9 (ref 5–15)
BUN: 12 mg/dL (ref 8–23)
CALCIUM: 8.4 mg/dL — AB (ref 8.9–10.3)
CO2: 22 mmol/L (ref 22–32)
CREATININE: 0.67 mg/dL (ref 0.44–1.00)
Chloride: 103 mmol/L (ref 98–111)
GFR calc Af Amer: >60 mL/min (ref >60)
Glucose, Bld: 121 mg/dL — ABNORMAL HIGH (ref 70–99)
POTASSIUM: 4.3 mmol/L (ref 3.5–5.1)
SODIUM: 134 mmol/L — AB (ref 135–145)

## 2018-01-25 LAB — HEMOGLOBIN A1C
Hgb A1c MFr Bld: 8.5 % — ABNORMAL HIGH (ref 4.8–5.6)
Mean Plasma Glucose: 197 mg/dL

## 2018-01-25 LAB — MAGNESIUM: MAGNESIUM: 1.7 mg/dL (ref 1.7–2.4)

## 2018-01-25 MED ORDER — POLYETHYLENE GLYCOL 3350 17 G PO PACK
17.0000 g | PACK | Freq: Every day | ORAL | Status: DC
  Administered 2018-01-25 – 2018-01-27 (×3): 17 g via ORAL
  Filled 2018-01-25 (×3): qty 1

## 2018-01-25 MED ORDER — CLONAZEPAM 0.5 MG PO TABS: 0.2500 mg | ORAL_TABLET | Freq: Two times a day (BID) | ORAL | Status: DC | PRN

## 2018-01-25 MED ORDER — MAGNESIUM SULFATE 2 GM/50ML IV SOLN
2.0000 g | Freq: Once | INTRAVENOUS | Status: DC
  Filled 2018-01-25: qty 50

## 2018-01-25 MED ORDER — CLONAZEPAM 0.25 MG PO TBDP
0.2500 mg | ORAL_TABLET | Freq: Two times a day (BID) | ORAL | Status: DC | PRN
  Administered 2018-01-25: 0.25 mg via ORAL
  Filled 2018-01-25: qty 1

## 2018-01-25 MED ORDER — QUETIAPINE FUMARATE 25 MG PO TABS
25.0000 mg | ORAL_TABLET | Freq: Two times a day (BID) | ORAL | Status: DC
  Administered 2018-01-25 – 2018-01-26 (×3): 25 mg via ORAL
  Filled 2018-01-25 (×3): qty 1

## 2018-01-25 MED ORDER — LAMOTRIGINE 25 MG PO TABS
25.0000 mg | ORAL_TABLET | Freq: Every day | ORAL | Status: DC
  Administered 2018-01-25 – 2018-01-27 (×3): 25 mg via ORAL
  Filled 2018-01-25 (×3): qty 1

## 2018-01-25 NOTE — Progress Notes (Signed)
Providing Compassionate, Quality Care - Together  Subjective: Patient is somewhat confused on exam. She denies worsening pain or parasthesias. She has not been OOB since admission.  Objective: Vital signs in last 24 hours: Temp:  [97.9 F (36.6 C)-98.4 F (36.9 C)] 97.9 F (36.6 C) (10/06 0431) Pulse Rate:  [73-97] 73 (10/06 1118) Resp:  [17-18] 18 (10/06 0431) BP: (142-169)/(67-84) 150/76 (10/06 1119) SpO2:  [98 %-100 %] 99 % (10/06 0431)  Intake/Output from previous day: 10/05 0701 - 10/06 0700 In: 480 [P.O.:480] Out: 650 [Urine:650] Intake/Output this shift: Total I/O In: 240 [P.O.:240] Out: -   Neurologic: Mental status: alertness: alert, orientation: person, place Motor: MAE 4+/5  Lab Results: Recent Labs    01/23/18 1843 01/24/18 0523  WBC 7.2 9.2  HGB 11.0* 11.5*  HCT 34.6* 36.6  PLT 141* 168   BMET Recent Labs    01/24/18 0523 01/25/18 0734  NA 136 134*  K 3.5 4.3  CL 100 103  CO2 25 22  GLUCOSE 167* 121*  BUN 12 12  CREATININE 0.67 0.67  CALCIUM 8.3* 8.4*    Studies/Results: Mr Lumbar Spine Wo Contrast  Result Date: 01/25/2018 CLINICAL DATA:  Acute onset severe back pain when the patient suffered a syncopal episode with a fall 01/22/2018. Acute T12 compression fracture on prior CT. EXAM: MRI LUMBAR SPINE WITHOUT CONTRAST TECHNIQUE: Multiplanar, multisequence MR imaging of the lumbar spine was performed. No intravenous contrast was administered. COMPARISON:  CT lumbar spine 01/22/2018 FINDINGS: Segmentation:  Standard. Alignment: There is straightening of the normal cervical lordosis and trace retrolisthesis L3 on L4 and L4 on L5. Vertebrae: Mildly comminuted T12 compression fracture with vertebral body height loss anteriorly of up to 90% is identified. There is marrow edema within the vertebral body consistent with acute injury. Edema extends into the pedicles but no fracture of the pedicles is identified. Marrow signal in the facets and lamina is  normal. Mild, remote inferior endplate compression fracture of L2 is noted. No other fracture. Degenerative endplate signal change K2-7 and L4-5 is identified. Conus medullaris and cauda equina: Conus extends to the T12-L1 level. Conus and cauda equina appear normal. Paraspinal and other soft tissues: Negative. Disc levels: T10-11 is imaged in the sagittal plane only and negative. T12-L1: Bony retropulsion off the superior endplate of C62 effaces the ventral thecal sac and slightly deforms the ventral cord. No frank cord compression. The foramina are open. T12-L1: Negative. L1-2: Negative. L2-3: Shallow disc bulge and mild-to-moderate facet degenerative disease without stenosis. L3-4: Loss of disc space height is seen. Shallow disc bulge and endplate spur are identified. There is mild central canal and right subarticular recess narrowing. Mild right foraminal narrowing is also seen. The left foramen is open. L4-5: Shallow disc bulge with a superimposed small left lateral recess and foraminal protrusion. There is mild narrowing in the left lateral recess. Mild to moderate bilateral foraminal narrowing is seen. L5-S1: Minimal disc bulge and mild facet degenerative disease. No stenosis. IMPRESSION: Acute or subacute mildly comminuted compression fracture of T12 has an appearance consistent with a posttraumatic or senile osteoporotic injury. Bony retropulsion off the superior endplate effaces the ventral thecal sac and slightly deforms the ventral cord. No cord signal abnormality. Mild left lateral recess and mild to moderate bilateral foraminal narrowing L4-5. Mild central canal, right subarticular recess and right foraminal narrowing L3-4. Electronically Signed   By: Inge Rise M.D.   On: 01/25/2018 10:47   Dg Chest [facility] 1 View  Result  Date: 01/23/2018 CLINICAL DATA:  Multiple falls. Hypertension. Slight shortness of breath. EXAM: PORTABLE CHEST 1 VIEW COMPARISON:  06/03/2016 FINDINGS: 1757 hours. The lungs  are clear without focal pneumonia, edema, pneumothorax or pleural effusion. The cardiopericardial silhouette is within normal limits for size. Distal left clavicle fracture noted. Bones are diffusely demineralized. IMPRESSION: No active disease. Electronically Signed   By: Misty Stanley M.D.   On: 01/23/2018 18:25    Assessment/Plan: Sandra [name] has a mildly comminuted compression fracture of T12 that she sustained from a fall 6 days ago. She is alright to ambulate with assistance while donning brace. Proceed with therapies. Dr. Zada Finders will assume care 01/26/2018   LOS: 2 days     Viona Gilmore, DNP, AGNP-C Nurse Practitioner  [facility] [place]. [address], [place], [place] [address] P: [phone]    F: [phone]

## 2018-01-25 NOTE — Progress Notes (Signed)
PT Cancellation Note  Patient Details Name: Sandra [name] MRN: [id number] DOB: 1940-06-09   Cancelled Treatment:    Reason Eval/Treat Not Completed: Medical issues which prohibited therapy;Active bedrest order. Pt is on bedrest with bathroom privileges and per neuro consult, pt was to get OOB with TLSO and PT. Pt and family would like to wait until the results of MRI prior to working toward OOB mobility. PT will follow-up once patient is medically cleared to safely participate.    Scheryl Marten PT, DPT    01/25/2018, 1:15 PM

## 2018-01-25 NOTE — Progress Notes (Signed)
Pt refused 0600 dose of heparin. Pt was educated on the importance of taking the medication. Pt still refused. Pt has been up for majority of the night and having some visual hallucinations. Pt reported that she saw cobwebs hanging in her room and she told the NT that someone was looking in her window.

## 2018-01-25 NOTE — Progress Notes (Signed)
PROGRESS NOTE  Sandra [name] GXQ:[id number] DOB: 1940/12/26 DOA: 01/23/2018 PCP: Glenda Chroman, MD  HPI/Recap of past 24 hours:   Patient is seen after returned from MRI, family at bedside  She appear less Confused today, still not oriented to place She had  Hallucination last night  She reports less back pain, denies acute bowel and bladder issues, able to move legs in bed, legs are less sensitive to touch today  No fever   Family  At bedside   Assessment/Plan: Principal Problem:   Compressed spine fracture ([place]) Active Problems:   Left leg DVT ([place])   Mitral regurgitation   Palpitations   Uncontrolled type 2 diabetes mellitus with complication, with long-term current use of insulin ([place])  T12 spine fracture with reported 40% height loss and 5 to 6 mm bulge towards the spine.   -with h/o frequent falls -Currently neurologically intact, no bowel incontinence, chronic bladder incontinence.   -Last dose of Xarelto 01/21/2018.  -pain control, bedrest - neurosurgery consulted  Encephalopathy? 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 PT eval, snf placement  H/o depression Reports she is diagnosed with depression for 4 yrs Currently  denies SI/HI    Code Status: full  Family Communication: patient and family at bedside  Disposition Plan:  snf at discharge   Consultants:  neurosurgery  Procedures:  none  Antibiotics:  none   Objective: BP (!) 150/67 (BP Location: Right Arm)   Pulse 73   Temp 97.9 F (36.6 C) (Oral)   Resp 18   SpO2 99%   Intake/Output Summary (Last 24 hours) at 01/25/2018 1038 Last data filed at 01/25/2018 0800 Gross per 24 hour  Intake 480 ml  Output 650 ml  Net -170 ml   There were no vitals filed for this visit.  Exam: Patient is examined daily including today on 01/25/2018, exams remain the same as of yesterday except that has changed    General:  NAD, confused   Cardiovascular: RRR  Respiratory: CTABL  Abdomen: Soft/ND/NT, positive BS  Musculoskeletal: No Edema ,able to lift against gravity at 30degress, less sensitive to touch  Neuro: alert, oriented to person, confused about time and place,   Data Reviewed: Basic Metabolic Panel: Recent Labs  Lab 01/23/18 1843 01/24/18 0523 01/25/18 0734  NA 136 136 134*  K 3.6 3.5 4.3  CL 100 100 103  CO2 25 25 22   GLUCOSE 205* 167* 121*  BUN 16 12 12   CREATININE 0.75 0.67 0.67  CALCIUM 8.3* 8.3* 8.4*  MG  --  1.2* 1.7   Liver Function Tests: No results for input(s): AST, ALT, ALKPHOS, BILITOT, PROT, ALBUMIN in the last 168 hours. No results for input(s): LIPASE, AMYLASE in the last 168 hours. No results for input(s): AMMONIA in the last 168 hours. CBC: Recent Labs  Lab 01/23/18 1843 01/24/18 0523  WBC 7.2 9.2  HGB 11.0* 11.5*  HCT 34.6* 36.6  MCV 86.1 86.3  PLT 141* 168   Cardiac Enzymes:   No results for input(s): CKTOTAL, CKMB, CKMBINDEX, TROPONINI in the last 168 hours. BNP (last 3 results) No results for input(s): BNP in the last 8760 hours.  ProBNP (last 3 results) No results for input(s): PROBNP in the last 8760 hours.  CBG: Recent Labs  Lab 01/24/18 0735 01/24/18 1146 01/24/18 1712  01/24/18 2151 01/25/18 0630  GLUCAP 165* 295* 152* 182* 138*    No results found for this or any previous visit (from the past 240 hour(s)).   Studies: No results found.  Scheduled Meds: . amLODipine  5 mg Oral Daily  . cholecalciferol  1,000 Units Oral Daily  . DULoxetine  60 mg Oral Daily  . heparin injection (subcutaneous)  5,000 Units Subcutaneous Q8H  . insulin aspart  0-5 Units Subcutaneous QHS  . insulin aspart  0-9 Units Subcutaneous TID WC  . insulin glargine  38 Units Subcutaneous BH-q7a  . metoprolol succinate  50 mg Oral BID  . nystatin   Topical TID  . pantoprazole  40 mg Oral Daily  . polyethylene glycol  17 g Oral Daily  . senna-docusate  1 tablet Oral BID    Continuous Infusions: . magnesium sulfate 1 - 4 g bolus IVPB       Time spent: 35 mins I have personally reviewed and interpreted on  01/25/2018 daily labs, imagings as discussed above under date review session and assessment and plans.  I reviewed all nursing notes, pharmacy notes, consultant notes,  vitals, pertinent old records  I have discussed plan of care as described above with RN , patient and family on 01/25/2018   Florencia Reasons MD, PhD  [facility] Pager [phone]. If 7PM-7AM, please contact night-coverage at [email or web address], password [facility] 01/25/2018, 10:38 AM  LOS: 2 days

## 2018-01-25 NOTE — Progress Notes (Signed)
OT Cancellation Note  Patient Details Name: Sandra [name] MRN: [id number] DOB: 07-16-1940   Cancelled Treatment:    Reason Eval/Treat Not Completed: Active bedrest order. Will continue to follow and initiate OT evaluation as able once bedrest order is lifted.    Delight Stare, OT Acute Rehabilitation Services Pager [phone] Office [phone]   Delight Stare 01/25/2018, 11:15 AM

## 2018-01-25 NOTE — Progress Notes (Signed)
Dr Florencia Reasons has been paged.  The patient's family reported that she asked for the family to be here this morning for discussion regarding plan of care.

## 2018-01-25 NOTE — Progress Notes (Signed)
PT Cancellation Note  Patient Details Name: Sandra [name] MRN: [id number] DOB: 1940/07/21   Cancelled Treatment:    Reason Eval/Treat Not Completed: Patient at procedure or test/unavailable. Pt is at MRI, PT will check back later as time allows.   Scheryl Marten PT, DPT    Jacqulyn Liner Sloan Leiter 01/25/2018, 10:12 AM

## 2018-01-25 NOTE — Progress Notes (Signed)
Patient to MRI.

## 2018-01-25 NOTE — Progress Notes (Signed)
Brantley Fling (patient's daughter) has been notified of the need to come to the hospital, to sit with for safety purposes.  Bedside tele sitter has been requested.  A staff member is presently sitting at the bedside for safety purposes.

## 2018-01-25 NOTE — Progress Notes (Signed)
Patient remains very confused but pleasant. Patient is attempting to hang her legs over the siderails.  Side rails are up x 3.  At this time the patient is not following safety commands.  Dr Erlinda Hong has been sent a text page- requesting an order for a safety sitter.

## 2018-01-25 NOTE — Progress Notes (Signed)
Dr Erlinda Hong was sent a message- family member request medication for restlessness

## 2018-01-26 ENCOUNTER — Encounter (HOSPITAL_COMMUNITY): Payer: Self-pay | Admitting: *Deleted

## 2018-01-26 LAB — CBC WITH DIFFERENTIAL/PLATELET
ABS IMMATURE GRANULOCYTES: 0.1 10*3/uL (ref 0.0–0.1)
BASOS ABS: 0.1 10*3/uL (ref 0.0–0.1)
BASOS PCT: 1 %
Eosinophils Absolute: 0.3 10*3/uL (ref 0.0–0.7)
Eosinophils Relative: 4 %
HCT: 38.7 % (ref 36.0–46.0)
Hemoglobin: 12 g/dL (ref 12.0–15.0)
Immature Granulocytes: 1 %
Lymphocytes Relative: 32 %
Lymphs Abs: 2.3 10*3/uL (ref 0.7–4.0)
MCH: 27 pg (ref 26.0–34.0)
MCHC: 31 g/dL (ref 30.0–36.0)
MCV: 87.2 fL (ref 78.0–100.0)
MONO ABS: 0.8 10*3/uL (ref 0.1–1.0)
Monocytes Relative: 11 %
Neutro Abs: 3.6 10*3/uL (ref 1.7–7.7)
Neutrophils Relative %: 51 %
PLATELETS: 169 10*3/uL (ref 150–400)
RBC: 4.44 MIL/uL (ref 3.87–5.11)
RDW: 14 % (ref 11.5–15.5)
WBC: 7.1 10*3/uL (ref 4.0–10.5)

## 2018-01-26 LAB — URINE CULTURE

## 2018-01-26 LAB — BASIC METABOLIC PANEL
Anion gap: 9 (ref 5–15)
BUN: 14 mg/dL (ref 8–23)
CHLORIDE: 106 mmol/L (ref 98–111)
CO2: 23 mmol/L (ref 22–32)
Calcium: 8.4 mg/dL — ABNORMAL LOW (ref 8.9–10.3)
Creatinine, Ser: 0.73 mg/dL (ref 0.44–1.00)
GFR calc Af Amer: >60 mL/min (ref >60)
GFR calc non Af Amer: >60 mL/min (ref >60)
GLUCOSE: 96 mg/dL (ref 70–99)
POTASSIUM: 4.2 mmol/L (ref 3.5–5.1)
SODIUM: 138 mmol/L (ref 135–145)

## 2018-01-26 LAB — GLUCOSE, CAPILLARY
GLUCOSE-CAPILLARY: 103 mg/dL — AB (ref 70–99)
GLUCOSE-CAPILLARY: 135 mg/dL — AB (ref 70–99)
Glucose-Capillary: 179 mg/dL — ABNORMAL HIGH (ref 70–99)
Glucose-Capillary: 176 mg/dL — ABNORMAL HIGH (ref 70–99)

## 2018-01-26 LAB — MAGNESIUM: Magnesium: 1.7 mg/dL (ref 1.7–2.4)

## 2018-01-26 MED ORDER — INSULIN ASPART 100 UNIT/ML ~~LOC~~ SOLN
3.0000 [IU] | Freq: Three times a day (TID) | SUBCUTANEOUS | Status: DC
  Administered 2018-01-26 – 2018-01-27 (×5): 3 [IU] via SUBCUTANEOUS

## 2018-01-26 MED ORDER — QUETIAPINE 12.5 MG HALF TABLET
12.5000 mg | ORAL_TABLET | Freq: Two times a day (BID) | ORAL | Status: DC
  Administered 2018-01-26 – 2018-01-27 (×3): 12.5 mg via ORAL
  Filled 2018-01-26 (×4): qty 1

## 2018-01-26 MED ORDER — INSULIN GLARGINE 100 UNIT/ML ~~LOC~~ SOLN
30.0000 [IU] | SUBCUTANEOUS | Status: DC
  Administered 2018-01-27: 30 [IU] via SUBCUTANEOUS
  Filled 2018-01-26 (×2): qty 0.3

## 2018-01-26 MED ORDER — RIVAROXABAN 20 MG PO TABS
20.0000 mg | ORAL_TABLET | Freq: Every day | ORAL | Status: DC
  Administered 2018-01-26 – 2018-01-27 (×2): 20 mg via ORAL
  Filled 2018-01-26 (×2): qty 1

## 2018-01-26 MED ORDER — MAGNESIUM SULFATE IN D5W 1-5 GM/100ML-% IV SOLN
1.0000 g | Freq: Once | INTRAVENOUS | Status: AC
  Administered 2018-01-26: 1 g via INTRAVENOUS
  Filled 2018-01-26: qty 100

## 2018-01-26 MED ORDER — RIVAROXABAN 20 MG PO TABS: 20.0000 mg | ORAL_TABLET | Freq: Every day | ORAL | Status: DC

## 2018-01-26 NOTE — Progress Notes (Signed)
PROGRESS NOTE  VETRA SHINALL GBT:[id number] DOB: 1940/06/19 DOA: 01/23/2018 PCP: Glenda Chroman, MD  HPI/Recap of past 24 hours:  Patient was agitated yesterday evening, she received prn sedatives Son came spent the night  Patient is drowsy this am   She reports less back pain, denies acute bowel and bladder issues, able to move legs in bed, legs are less sensitive to touch today  No fever     Assessment/Plan: Principal Problem:   Compressed spine fracture ([facility]) Active Problems:   Left leg DVT ([facility])   Mitral regurgitation   Palpitations   Uncontrolled type 2 diabetes mellitus with complication, with long-term current use of insulin ([facility])  T12 spine fracture with reported 40% height loss and 5 to 6 mm bulge towards the spine.   -with h/o frequent falls -Currently neurologically intact, no bowel incontinence, chronic bladder incontinence.   -Last dose of Xarelto 01/21/2018.  -case discussed with neurosurgery Dr Zada Finders over the phone who is ok with activity with back brace and resume xarelto, plan conservative management, Dr Zada Finders will leave formal recommendation in chart - neurosurgery input appreciated  Delirium, Encephalopathy? With h/o dementia ( family confirmed patient was diagnosed with dementia two years ago, they noticed increased confusion last months, they prefer snf placement) No fever, no leukocytosis  ua/urine culture in process Frequent reorientation She is at risk of delirium, family understands Started seroquel on 10/6, she is drowsy this am, decrease seroquel dose, d/c prn klonopin, family reports seroquel helped more than klonopin   Hypokalemia/hypomagnesemia: Oral k supplement, iv mag supplement  Insulin dependent dm2 a1c 8.5 Am blood glucose 96, decrease lantus, add on meal coverage, continue ssi   HTN: continue home meds toprol-xl/norvasc  H/o  recurrent DVT/PE   xarelto held on presentation, ok to resume per neurosurgery, no plan for  surgery   Frequent falls/poor balance Diabetes neuropathy? PT eval, snf placement  H/o depression Reports she is diagnosed with depression for 4 yrs Currently denies SI/HI   Code Status: full  Family Communication: patient and daughter over the phone  Disposition Plan:  snf at discharge   Consultants:  neurosurgery  Procedures:  none  Antibiotics:  none   Objective: BP 127/65 (BP Location: Right Arm)   Pulse 78   Temp 97.9 F (36.6 C) (Oral)   Resp 15   SpO2 97%   Intake/Output Summary (Last 24 hours) at 01/26/2018 1145 Last data filed at 01/26/2018 1035 Gross per 24 hour  Intake 340 ml  Output 850 ml  Net -510 ml   There were no vitals filed for this visit.  Exam: Patient is examined daily including today on 01/26/2018, exams remain the same as of yesterday except that has changed    General:  NAD, confused , lethargic  Cardiovascular: RRR  Respiratory: CTABL  Abdomen: Soft/ND/NT, positive BS  Musculoskeletal: No Edema ,able to lift against gravity at 30degress, less sensitive to touch  Neuro: alert, oriented to person, confused about time and place,   Data Reviewed: Basic Metabolic Panel: Recent Labs  Lab 01/23/18 1843 01/24/18 0523 01/25/18 0734 01/26/18 0642  NA 136 136 134* 138  K 3.6 3.5 4.3 4.2  CL 100 100 103 106  CO2 25 25 22 23   GLUCOSE 205* 167* 121* 96  BUN 16 12 12 14   CREATININE 0.75 0.67 0.67 0.73  CALCIUM 8.3* 8.3* 8.4* 8.4*  MG  --  1.2* 1.7 1.7   Liver Function Tests: No results for input(s): AST, ALT,  ALKPHOS, BILITOT, PROT, ALBUMIN in the last 168 hours. No results for input(s): LIPASE, AMYLASE in the last 168 hours. No results for input(s): AMMONIA in the last 168 hours. CBC: Recent Labs  Lab 01/23/18 1843 01/24/18 0523 01/26/18 0642  WBC 7.2 9.2 7.1  NEUTROABS  --   --  3.6  HGB 11.0* 11.5* 12.0  HCT 34.6* 36.6 38.7  MCV 86.1 86.3 87.2  PLT 141* 168 169   Cardiac Enzymes:   No results for input(s):  CKTOTAL, CKMB, CKMBINDEX, TROPONINI in the last 168 hours. BNP (last 3 results) No results for input(s): BNP in the last 8760 hours.  ProBNP (last 3 results) No results for input(s): PROBNP in the last 8760 hours.  CBG: Recent Labs  Lab 01/25/18 1141 01/25/18 1632 01/25/18 2300 01/26/18 0629 01/26/18 1123  GLUCAP 278* 232* 245* 103* 179*    No results found for this or any previous visit (from the past 240 hour(s)).   Studies: Ct Head Wo Contrast  Result Date: 01/25/2018 CLINICAL DATA:  77 year old female with encephalopathy. Diagnosed with dementia but recent increased confusion. EXAM: CT HEAD WITHOUT CONTRAST TECHNIQUE: Contiguous axial images were obtained from the base of the skull through the vertex without intravenous contrast. COMPARISON:  [facility] Head CT 07/03/2016. brain MRI 06/03/2016. FINDINGS: Brain: Stable cerebral volume since 2018. Confluent bilateral anterior frontal lobe white matter hypodensity. Small area of superimposed chronic cortical encephalomalacia in the right anterior frontal lobe (series 3, image 20). 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 No midline shift, ventriculomegaly, mass effect, evidence of mass lesion, intracranial hemorrhage or evidence of cortically based acute infarction. Vascular: Calcified atherosclerosis at the skull base. No suspicious intracranial vascular hyperdensity. Skull: No acute osseous abnormality identified. Sinuses/Orbits: Visualized paranasal sinuses and mastoids are stable and well pneumatized. Other: No acute orbit or scalp soft tissue findings. IMPRESSION: 1.  No acute intracranial abnormality. 2. 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 Electronically Signed   By: Genevie Ann M.D.   On: 01/25/2018 14:24    Scheduled Meds: . amLODipine  5 mg Oral  Daily  . cholecalciferol  1,000 Units Oral Daily  . DULoxetine  60 mg Oral Daily  . heparin injection (subcutaneous)  5,000 Units Subcutaneous Q8H  . insulin aspart  0-5 Units Subcutaneous QHS  . insulin aspart  0-9 Units Subcutaneous TID WC  . insulin glargine  38 Units Subcutaneous BH-q7a  . lamoTRIgine  25 mg Oral QHS  . metoprolol succinate  50 mg Oral BID  . nystatin   Topical TID  . pantoprazole  40 mg Oral Daily  . polyethylene glycol  17 g Oral Daily  . QUEtiapine  12.5 mg Oral BID  . senna-docusate  1 tablet Oral BID    Continuous Infusions: . magnesium sulfate 1 - 4 g bolus IVPB       Time spent: 35 mins I have personally reviewed and interpreted on  01/26/2018 daily labs, imagings as discussed above under date review session and assessment and plans.  I reviewed all nursing notes, pharmacy notes, consultant notes,  vitals, pertinent old records  I have discussed plan of care as described above with RN , patient and family on 01/26/2018   Florencia Reasons MD, PhD  [facility] Pager [phone]. If 7PM-7AM, please contact night-coverage at [email or web address], password [facility] 01/26/2018, 11:45 AM  LOS: 3 days

## 2018-01-26 NOTE — Evaluation (Signed)
Physical Therapy Evaluation Patient Details Name: Sandra [name] MRN: [id number] DOB: 1940/08/08 Today's Date: 01/26/2018   History of Present Illness  77 year old female status post fall approximately 7 days ago and then a secondary fall with worsening of her symptoms 5 days ago. Imaging revealed T12 compression fracture with retropulsion and stenosis. PMH significant for PE, mitral regurgitation, HTN, DVT, DM.  Clinical Impression  Pt admitted with above diagnosis. Pt currently with functional limitations due to the deficits listed below (see PT Problem List). At the time of PT eval pt was able to perform transfers with heavy +2 mod assist for balance support and safety. Pt very lethargic during session and had a difficult time maintaining eyes open throughout activity. Based on performance today, feel SNF is the most appropriate discharge disposition. Will continue to assess for d/c needs in future sessions when pt is hopefully more alert. Acutely, pt will benefit from skilled PT to increase their independence and safety with mobility to allow discharge to the venue listed below.       Follow Up Recommendations SNF;Supervision/Assistance - 24 hour    Equipment Recommendations  None recommended by PT    Recommendations for Other Services       Precautions / Restrictions Precautions Precautions: Fall;Back Precaution Booklet Issued: No Precaution Comments: Unable to educate on back precautions due to lethargy. Required Braces or Orthoses: Spinal Brace Spinal Brace: Thoracolumbosacral orthotic;Applied in sitting position Restrictions Weight Bearing Restrictions: No      Mobility  Bed Mobility Overal bed mobility: Needs Assistance Bed Mobility: Rolling;Sidelying to Sit;Sit to Sidelying Rolling: Max assist;+2 for physical assistance Sidelying to sit: Max assist;+2 for physical assistance;HOB elevated     Sit to sidelying: Total assist;+2 for physical assistance General bed  mobility comments: Assist for every aspect of bed mobility. Pt was educated on log roll technique throughout bed mobility but was not following any commands.   Transfers Overall transfer level: Needs assistance Equipment used: 2 person hand held assist Transfers: Sit to/from [facility] Sit to Stand: Mod assist;+2 physical assistance;From elevated surface Stand pivot transfers: Mod assist;+2 physical assistance;From elevated surface       General transfer comment: VC's for hand placement on seated surface for safety. Pt transferred to/from [facility] with heavy +2 mod assist for balance support and safety.   Ambulation/Gait             General Gait Details: Unable  Stairs            Wheelchair Mobility    Modified Rankin (Stroke Patients Only)       Balance Overall balance assessment: Needs assistance Sitting-balance support: Bilateral upper extremity supported Sitting balance-Leahy Scale: Poor Sitting balance - Comments: assist required to maintain sitting balance.  Postural control: Posterior lean Standing balance support: Bilateral upper extremity supported;During functional activity Standing balance-Leahy Scale: Zero Standing balance comment: +2 assist required                             Pertinent Vitals/Pain Pain Assessment: Faces Faces Pain Scale: Hurts even more Pain Location: Back Pain Descriptors / Indicators: Discomfort;Grimacing Pain Intervention(s): Limited activity within patient's tolerance;Monitored during session;Repositioned    Home Living Family/patient expects to be discharged to:: Private residence Living Arrangements: Alone Available Help at Discharge: Family;Available PRN/intermittently Type of Home: House Home Access: Stairs to enter Entrance Stairs-Rails: Psychiatric nurse of Steps: 2 Home Layout: One level Home Equipment: Walker - 2 wheels;Wheelchair -  manual;Grab bars - tub/shower       Prior Function           Comments: Freq falls per chart review     Hand Dominance        Extremity/Trunk Assessment   Upper Extremity Assessment Upper Extremity Assessment: Defer to OT evaluation    Lower Extremity Assessment Lower Extremity Assessment: Generalized weakness    Cervical / Trunk Assessment Cervical / Trunk Assessment: Other exceptions Cervical / Trunk Exceptions: T12 fracture  Communication   Communication: No difficulties  Cognition Arousal/Alertness: Lethargic Behavior During Therapy: Flat affect;Restless Overall Cognitive Status: Difficult to assess                                        General Comments      Exercises     Assessment/Plan    PT Assessment Patient needs continued PT services  PT Problem List Decreased strength;Decreased range of motion;Decreased activity tolerance;Decreased balance;Decreased mobility;Decreased knowledge of use of DME;Decreased safety awareness;Decreased knowledge of precautions;Pain       PT Treatment Interventions DME instruction;Gait training;Stair training;Functional mobility training;Therapeutic activities;Therapeutic exercise;Neuromuscular re-education;Patient/family education    PT Goals (Current goals can be found in the Care Plan section)  Acute Rehab PT Goals Patient Stated Goal: To lay back down and take a nap PT Goal Formulation: Patient unable to participate in goal setting Time For Goal Achievement: 02/02/18 Potential to Achieve Goals: Good    Frequency Min 5X/week   Barriers to discharge        Co-evaluation               AM-PAC PT "6 Clicks" Daily Activity  Outcome Measure Difficulty turning over in bed (including adjusting bedclothes, sheets and blankets)?: Unable Difficulty moving from lying on back to sitting on the side of the bed? : Unable Difficulty sitting down on and standing up from a chair with arms (e.g., wheelchair, bedside commode, etc,.)?:  Unable Help needed moving to and from a bed to chair (including a wheelchair)?: Total Help needed walking in hospital room?: Total Help needed climbing 3-5 steps with a railing? : Total 6 Click Score: 6    End of Session Equipment Utilized During Treatment: Gait belt;Back brace Activity Tolerance: Patient limited by lethargy Patient left: in bed;with call bell/phone within reach;with nursing/sitter in room;with bed alarm set Nurse Communication: Other (comment)(RN present throughout session) PT Visit Diagnosis: Unsteadiness on feet (R26.81);Pain;Difficulty in walking, not elsewhere classified (R26.2) Pain - part of body: (back)    Time: 6226-3335 PT Time Calculation (min) (ACUTE ONLY): 25 min   Charges:   PT Evaluation $PT Eval Moderate Complexity: 1 Mod PT Treatments $Gait Training: 8-22 mins        Rolinda Roan, PT, DPT Acute Rehabilitation Services Pager: [phone] Office: [phone]   Thelma Comp 01/26/2018, 12:59 PM

## 2018-01-26 NOTE — Clinical Social Work Note (Signed)
Clinical Social Work Assessment  Patient Details  Name: Sandra [name] MRN: [id number] Date of Birth: 09-11-1940  Date of referral:  01/26/18               Reason for consult:  Discharge Planning                Permission sought to share information with:  Case Manager, Facility Sport and exercise psychologist, Family Supports Permission granted to share information::  Yes, Verbal Permission Granted  Name::     Brantley Fling  Agency::  SNFs  Relationship::  daughter Henrietta Dine)  Contact Information:  [phone]  Housing/Transportation Living arrangements for the past 2 months:  Single Family Home Source of Information:  Patient Patient Interpreter Needed:  None Criminal Activity/Legal Involvement Pertinent to Current Situation/Hospitalization:  No - Comment as needed Significant Relationships:  Adult Children Lives with:  Adult Children Do you feel safe going back to the place where you live?  No Need for family participation in patient care:  Yes (Comment)  Care giving concerns:  CSW received referral for possible SNF placement at time of discharge. Spoke with patient's daughter regarding possibility of SNF placement . Patient's  Daughter   is currently unable to care for her at their home given patient's current needs and fall risk.  Daughter expressed understanding of PT recommendation and are agreeable to SNF placement at time of discharge. CSW to continue to follow and assist with discharge planning needs.     Social Worker assessment / plan:  Spoke with patient and  Daughter concerning possibility of rehab at [facility] before returning home.    Employment status:  Retired Nurse, adult PT Recommendations:  [place] / Referral to community resources:  [place]  Patient/Family's Response to care:  Patient's daughter recognizes need for rehab before returning home and are agreeable to a SNF in [place] or [place] area. They  report preference for  [facility]   . CSW explained insurance authorization process. Patient's family reported that they want patient to get stronger to be able to come back home.   Patient/Family's Understanding of and Emotional Response to Diagnosis, Current Treatment, and Prognosis:  Patient/family is realistic regarding therapy needs and expressed being hopeful for SNF  placement. Patient expressed understanding of CSW role and discharge process as well as medical condition. No questions/concerns about plan or treatment.    Emotional Assessment Appearance:  Appears stated age Attitude/Demeanor/Rapport:  Unable to Assess Affect (typically observed):  Unable to Assess Orientation:  Oriented to Self, Oriented to Place Alcohol / Substance use:  Not Applicable Psych involvement (Current and /or in the community):  No (Comment)  Discharge Needs  Concerns to be addressed:  Discharge Planning Concerns Readmission within the last 30 days:  No Current discharge risk:  Dependent with Mobility Barriers to Discharge:  Continued Medical Work up   [employer], [place] 01/26/2018, 2:12 PM

## 2018-01-26 NOTE — NC FL2 (Signed)
[place] LEVEL OF CARE SCREENING TOOL     IDENTIFICATION  Patient Name: Sandra [name] Birthdate: 24-Nov-1940 Sex: female Admission Date (Current Location): 01/23/2018  [facility] and [place] Number:  [employer] and Address:  [facility] [place], [place] [address] [place], [place] [address]      Provider Number: [id number]  Attending Physician Name and Address:  Florencia Reasons, MD  Relative Name and Phone Number:  Brantley Fling (daughter) [phone]    Current Level of Care: Hospital Recommended Level of Care: [place] Prior Approval Number:    Date Approved/Denied:   PASRR Number: [id number]  Discharge Plan: SNF    Current Diagnoses: Patient Active Problem List   Diagnosis Date Noted  . Compressed spine fracture ([place]) 01/23/2018  . Uncontrolled type 2 diabetes mellitus with complication, with long-term current use of insulin ([place]) 05/18/2015  . Syncope 08/13/2014  . Personal history of colonic polyps 08/04/2012  . Hematochezia 08/04/2012  . Palpitations 08/21/2011  . Atypical chest pain 08/21/2011  . Mitral regurgitation   . Ankle fracture, left 04/27/2011  . Left leg DVT ([place]) 04/27/2011  . Pulmonary embolism ([place]) 04/26/2011    Orientation RESPIRATION BLADDER Height & Weight     Self, Place  Normal Incontinent, External catheter Weight:   Height:     BEHAVIORAL SYMPTOMS/MOOD NEUROLOGICAL BOWEL NUTRITION STATUS      Continent Diet(see discharge summary)  AMBULATORY STATUS COMMUNICATION OF NEEDS Skin   Limited Assist Verbally Other (Comment)(moisture associated skin damage on the buttocks and labia)                       Personal Care Assistance Level of Assistance  Bathing, Feeding, Dressing, Total care Bathing Assistance: Limited assistance Feeding assistance: Independent Dressing Assistance: Limited assistance Total Care Assistance: Limited assistance   Functional Limitations Info  Sight,  Hearing, Speech Sight Info: Impaired Hearing Info: Adequate Speech Info: Adequate    SPECIAL CARE FACTORS FREQUENCY  PT (By licensed PT), OT (By licensed OT)     PT Frequency: 5x weekly OT Frequency: 5x weekly            Contractures Contractures Info: Not present    Additional Factors Info  Allergies, Code Status Code Status Info: Full Allergies Info: Allergies:  Buprenex Buprenorphine, Namenda Memantine Hcl, Other           Current Medications (01/26/2018):  This is the current hospital active medication list Current Facility-Administered Medications  Medication Dose Route Frequency Provider Last Rate Last Dose  . acetaminophen (TYLENOL) tablet 650 mg  650 mg Oral Q6H PRN Thurnell Lose, MD   650 mg at 01/24/18 2331   Or  . acetaminophen (TYLENOL) suppository 650 mg  650 mg Rectal Q6H PRN Thurnell Lose, MD      . amLODipine (NORVASC) tablet 5 mg  5 mg Oral Daily Thurnell Lose, MD   5 mg at 01/26/18 0855  . cholecalciferol (VITAMIN D) tablet 1,000 Units  1,000 Units Oral Daily Florencia Reasons, MD   1,000 Units at 01/26/18 0855  . DULoxetine (CYMBALTA) DR capsule 60 mg  60 mg Oral Daily Thurnell Lose, MD   60 mg at 01/26/18 0855  . hydrALAZINE (APRESOLINE) injection 10 mg  10 mg Intravenous Q6H PRN Thurnell Lose, MD   10 mg at 01/24/18 2203  . insulin aspart (novoLOG) injection 0-5 Units  0-5 Units Subcutaneous QHS Thurnell Lose, MD  2 Units at 01/25/18 2308  . insulin aspart (novoLOG) injection 0-9 Units  0-9 Units Subcutaneous TID WC Thurnell Lose, MD   2 Units at 01/26/18 1259  . insulin aspart (novoLOG) injection 3 Units  3 Units Subcutaneous TID WC Florencia Reasons, MD   3 Units at 01/26/18 1300  . [START ON 01/27/2018] insulin glargine (LANTUS) injection 30 Units  30 Units Subcutaneous Pennie Rushing, MD      . lamoTRIgine (LAMICTAL) tablet 25 mg  25 mg Oral Vonzell Schlatter, MD   25 mg at 01/25/18 2301  . magnesium citrate solution 1 Bottle  1 Bottle Oral Once  PRN Thurnell Lose, MD      . magnesium sulfate IVPB 2 g 50 mL  2 g Intravenous Once Florencia Reasons, MD      . metoprolol succinate (TOPROL-XL) 24 hr tablet 50 mg  50 mg Oral BID Thurnell Lose, MD   50 mg at 01/26/18 0855  . metoprolol tartrate (LOPRESSOR) injection 5 mg  5 mg Intravenous Q4H PRN Thurnell Lose, MD      . nystatin (MYCOSTATIN/NYSTOP) topical powder   Topical TID Thurnell Lose, MD      . pantoprazole (PROTONIX) EC tablet 40 mg  40 mg Oral Daily Thurnell Lose, MD   40 mg at 01/26/18 0856  . polyethylene glycol (MIRALAX / GLYCOLAX) packet 17 g  17 g Oral Daily Florencia Reasons, MD   17 g at 01/26/18 0856  . QUEtiapine (SEROQUEL) tablet 12.5 mg  12.5 mg Oral BID Florencia Reasons, MD      . rivaroxaban Alveda Reasons) tablet 20 mg  20 mg Oral Q supper Florencia Reasons, MD      . senna-docusate (Senokot-S) tablet 1 tablet  1 tablet Oral BID Florencia Reasons, MD   1 tablet at 01/26/18 0856  . traMADol (ULTRAM) tablet 50 mg  50 mg Oral Q6H PRN Thurnell Lose, MD   50 mg at 01/24/18 1016     Discharge Medications: Please see discharge summary for a list of discharge medications.  Relevant Imaging Results:  Relevant Lab Results:   Additional Information IDP:[id number]  Alberteen Sam, LCSW

## 2018-01-26 NOTE — Progress Notes (Signed)
Neurosurgery Service Progress Note  Subjective: No acute events overnight, denies back or leg pain on my evaluation this afternoon, no change in bowel or bladder control.   Objective: Vitals:   01/25/18 1510 01/25/18 2115 01/26/18 0556 01/26/18 1238  BP: 139/76 127/81 127/65 136/60  Pulse: 90 99 78 78  Resp: 17 17 15 19   Temp: 98.4 F (36.9 C) 98.3 F (36.8 C) 97.9 F (36.6 C) 97.9 F (36.6 C)  TempSrc: Oral Oral Oral Oral  SpO2: 97% 93% 97% 100%   Temp (24hrs), Avg:98 F (36.7 C), Min:97.9 F (36.6 C), Max:98.3 F (36.8 C)  CBC Latest Ref Rng & Units 01/26/2018 01/24/2018 01/23/2018  WBC 4.0 - 10.5 K/uL 7.1 9.2 7.2  Hemoglobin 12.0 - 15.0 g/dL 12.0 11.5(L) 11.0(L)  Hematocrit 36.0 - 46.0 % 38.7 36.6 34.6(L)  Platelets 150 - 400 K/uL 169 168 141(L)   BMP Latest Ref Rng & Units 01/26/2018 01/25/2018 01/24/2018  Glucose 70 - 99 mg/dL 96 121(H) 167(H)  BUN 8 - 23 mg/dL 14 12 12   Creatinine 0.44 - 1.00 mg/dL 0.73 0.67 0.67  Sodium 135 - 145 mmol/L 138 134(L) 136  Potassium 3.5 - 5.1 mmol/L 4.2 4.3 3.5  Chloride 98 - 111 mmol/L 106 103 100  CO2 22 - 32 mmol/L 23 22 25   Calcium 8.9 - 10.3 mg/dL 8.4(L) 8.4(L) 8.3(L)    Intake/Output Summary (Last 24 hours) at 01/26/2018 1700 Last data filed at 01/26/2018 1358 Gross per 24 hour  Intake 200 ml  Output 500 ml  Net -300 ml    Current Facility-Administered Medications:  .  acetaminophen (TYLENOL) tablet 650 mg, 650 mg, Oral, Q6H PRN, 650 mg at 01/24/18 2331 **OR** acetaminophen (TYLENOL) suppository 650 mg, 650 mg, Rectal, Q6H PRN, Lala Lund K, MD .  amLODipine (NORVASC) tablet 5 mg, 5 mg, Oral, Daily, Thurnell Lose, MD, 5 mg at 01/26/18 0855 .  cholecalciferol (VITAMIN D) tablet 1,000 Units, 1,000 Units, Oral, Daily, Florencia Reasons, MD, 1,000 Units at 01/26/18 0855 .  DULoxetine (CYMBALTA) DR capsule 60 mg, 60 mg, Oral, Daily, Thurnell Lose, MD, 60 mg at 01/26/18 0855 .  hydrALAZINE (APRESOLINE) injection 10 mg, 10 mg,  Intravenous, Q6H PRN, Thurnell Lose, MD, 10 mg at 01/24/18 2203 .  insulin aspart (novoLOG) injection 0-5 Units, 0-5 Units, Subcutaneous, QHS, Thurnell Lose, MD, 2 Units at 01/25/18 2308 .  insulin aspart (novoLOG) injection 0-9 Units, 0-9 Units, Subcutaneous, TID WC, Thurnell Lose, MD, 2 Units at 01/26/18 1259 .  insulin aspart (novoLOG) injection 3 Units, 3 Units, Subcutaneous, TID WC, Florencia Reasons, MD, 3 Units at 01/26/18 1300 .  [START ON 01/27/2018] insulin glargine (LANTUS) injection 30 Units, 30 Units, Subcutaneous, Rodney Booze, Florencia Reasons, MD .  lamoTRIgine (LAMICTAL) tablet 25 mg, 25 mg, Oral, QHS, Florencia Reasons, MD, 25 mg at 01/25/18 2301 .  magnesium citrate solution 1 Bottle, 1 Bottle, Oral, Once PRN, Thurnell Lose, MD .  magnesium sulfate IVPB 2 g 50 mL, 2 g, Intravenous, Once, Florencia Reasons, MD .  metoprolol succinate (TOPROL-XL) 24 hr tablet 50 mg, 50 mg, Oral, BID, Thurnell Lose, MD, 50 mg at 01/26/18 0855 .  metoprolol tartrate (LOPRESSOR) injection 5 mg, 5 mg, Intravenous, Q4H PRN, Lala Lund K, MD .  nystatin (MYCOSTATIN/NYSTOP) topical powder, , Topical, TID, Candiss Norse, Prashant K, MD .  pantoprazole (PROTONIX) EC tablet 40 mg, 40 mg, Oral, Daily, Thurnell Lose, MD, 40 mg at 01/26/18 0856 .  polyethylene glycol (  MIRALAX / GLYCOLAX) packet 17 g, 17 g, Oral, Daily, Florencia Reasons, MD, 17 g at 01/26/18 0856 .  QUEtiapine (SEROQUEL) tablet 12.5 mg, 12.5 mg, Oral, BID, Florencia Reasons, MD .  rivaroxaban Alveda Reasons) tablet 20 mg, 20 mg, Oral, Q supper, Florencia Reasons, MD .  senna-docusate (Senokot-S) tablet 1 tablet, 1 tablet, Oral, BID, Florencia Reasons, MD, 1 tablet at 01/26/18 0856 .  traMADol (ULTRAM) tablet 50 mg, 50 mg, Oral, Q6H PRN, Thurnell Lose, MD, 50 mg at 01/24/18 1016   Physical Exam: Awake/alert, speech fluent with normal content, PERRL, gaze neutral, FCx4 without preference, SILTx4, strength grossly 5/5x4 and appropriate for patient's age and appearance  Assessment & Plan: 77 y.o.  woman w/ h/o dementia and recurrent DVT/PE, s/p T12 burst fracture, at neurologic baseline. -no surgical intervention indicated, pain minimal on my evaluation and no symptoms of cord or root impingement -TLSO brace when out of bed -pt should follow up with me in clinic in 6 weeks with repeat xrays  Marcello Moores A Izzac Rockett  01/26/18 5:00 PM

## 2018-01-26 NOTE — Plan of Care (Signed)

## 2018-01-26 NOTE — Care Management Important Message (Signed)
Important Message  Patient Details  Name: Sandra [name] MRN: [id number] Date of Birth: October 23, 1940   Medicare Important Message Given:  Yes    Orbie Pyo 01/26/2018, 4:05 PM

## 2018-01-26 NOTE — Discharge Instructions (Signed)

## 2018-01-27 DIAGNOSIS — E1165 Type 2 diabetes mellitus with hyperglycemia: Secondary | ICD-10-CM | POA: Diagnosis not present

## 2018-01-27 DIAGNOSIS — L603 Nail dystrophy: Secondary | ICD-10-CM | POA: Diagnosis not present

## 2018-01-27 DIAGNOSIS — I825Z9 Chronic embolism and thrombosis of unspecified deep veins of unspecified distal lower extremity: Secondary | ICD-10-CM | POA: Diagnosis not present

## 2018-01-27 DIAGNOSIS — S22080A Wedge compression fracture of T11-T12 vertebra, initial encounter for closed fracture: Secondary | ICD-10-CM | POA: Diagnosis not present

## 2018-01-27 DIAGNOSIS — F0150 Vascular dementia without behavioral disturbance: Secondary | ICD-10-CM | POA: Diagnosis not present

## 2018-01-27 DIAGNOSIS — S22081D Stable burst fracture of T11-T12 vertebra, subsequent encounter for fracture with routine healing: Secondary | ICD-10-CM | POA: Diagnosis not present

## 2018-01-27 DIAGNOSIS — S22008A Other fracture of unspecified thoracic vertebra, initial encounter for closed fracture: Secondary | ICD-10-CM | POA: Diagnosis not present

## 2018-01-27 DIAGNOSIS — I82502 Chronic embolism and thrombosis of unspecified deep veins of left lower extremity: Secondary | ICD-10-CM | POA: Diagnosis not present

## 2018-01-27 DIAGNOSIS — F0390 Unspecified dementia without behavioral disturbance: Secondary | ICD-10-CM | POA: Diagnosis not present

## 2018-01-27 DIAGNOSIS — Z794 Long term (current) use of insulin: Secondary | ICD-10-CM | POA: Diagnosis not present

## 2018-01-27 DIAGNOSIS — B351 Tinea unguium: Secondary | ICD-10-CM | POA: Diagnosis not present

## 2018-01-27 DIAGNOSIS — M1990 Unspecified osteoarthritis, unspecified site: Secondary | ICD-10-CM | POA: Diagnosis not present

## 2018-01-27 DIAGNOSIS — E119 Type 2 diabetes mellitus without complications: Secondary | ICD-10-CM | POA: Diagnosis not present

## 2018-01-27 DIAGNOSIS — Z7901 Long term (current) use of anticoagulants: Secondary | ICD-10-CM | POA: Diagnosis not present

## 2018-01-27 DIAGNOSIS — S22089A Unspecified fracture of T11-T12 vertebra, initial encounter for closed fracture: Secondary | ICD-10-CM | POA: Diagnosis not present

## 2018-01-27 DIAGNOSIS — M810 Age-related osteoporosis without current pathological fracture: Secondary | ICD-10-CM | POA: Diagnosis not present

## 2018-01-27 DIAGNOSIS — R410 Disorientation, unspecified: Secondary | ICD-10-CM | POA: Diagnosis not present

## 2018-01-27 DIAGNOSIS — M6281 Muscle weakness (generalized): Secondary | ICD-10-CM | POA: Diagnosis not present

## 2018-01-27 DIAGNOSIS — R296 Repeated falls: Secondary | ICD-10-CM | POA: Diagnosis not present

## 2018-01-27 DIAGNOSIS — M2550 Pain in unspecified joint: Secondary | ICD-10-CM | POA: Diagnosis not present

## 2018-01-27 DIAGNOSIS — R2689 Other abnormalities of gait and mobility: Secondary | ICD-10-CM | POA: Diagnosis not present

## 2018-01-27 DIAGNOSIS — I10 Essential (primary) hypertension: Secondary | ICD-10-CM | POA: Diagnosis not present

## 2018-01-27 DIAGNOSIS — E1140 Type 2 diabetes mellitus with diabetic neuropathy, unspecified: Secondary | ICD-10-CM | POA: Diagnosis not present

## 2018-01-27 DIAGNOSIS — R41841 Cognitive communication deficit: Secondary | ICD-10-CM | POA: Diagnosis not present

## 2018-01-27 DIAGNOSIS — E1151 Type 2 diabetes mellitus with diabetic peripheral angiopathy without gangrene: Secondary | ICD-10-CM | POA: Diagnosis not present

## 2018-01-27 DIAGNOSIS — Z7401 Bed confinement status: Secondary | ICD-10-CM | POA: Diagnosis not present

## 2018-01-27 LAB — GLUCOSE, CAPILLARY
GLUCOSE-CAPILLARY: 281 mg/dL — AB (ref 70–99)
Glucose-Capillary: 151 mg/dL — ABNORMAL HIGH (ref 70–99)
Glucose-Capillary: 299 mg/dL — ABNORMAL HIGH (ref 70–99)

## 2018-01-27 LAB — BASIC METABOLIC PANEL
Anion gap: 8 (ref 5–15)
BUN: 15 mg/dL (ref 8–23)
CHLORIDE: 106 mmol/L (ref 98–111)
CO2: 24 mmol/L (ref 22–32)
CREATININE: 0.85 mg/dL (ref 0.44–1.00)
Calcium: 8.4 mg/dL — ABNORMAL LOW (ref 8.9–10.3)
GFR calc Af Amer: >60 mL/min (ref >60)
GFR calc non Af Amer: >60 mL/min (ref >60)
Glucose, Bld: 187 mg/dL — ABNORMAL HIGH (ref 70–99)
Potassium: 4.5 mmol/L (ref 3.5–5.1)
SODIUM: 138 mmol/L (ref 135–145)

## 2018-01-27 LAB — MAGNESIUM: MAGNESIUM: 2 mg/dL (ref 1.7–2.4)

## 2018-01-27 MED ORDER — VITAMIN D-3 25 MCG (1000 UT) PO CAPS: 1000.0000 [IU] | ORAL_CAPSULE | Freq: Every day | ORAL | 0 refills | Status: DC

## 2018-01-27 MED ORDER — FLUCONAZOLE 150 MG PO TABS
150.0000 mg | ORAL_TABLET | Freq: Once | ORAL | Status: AC
  Administered 2018-01-27: 150 mg via ORAL
  Filled 2018-01-27: qty 1

## 2018-01-27 MED ORDER — SENNOSIDES-DOCUSATE SODIUM 8.6-50 MG PO TABS: 1.0000 | ORAL_TABLET | Freq: Every day | ORAL | 0 refills | Status: DC

## 2018-01-27 MED ORDER — METOPROLOL SUCCINATE ER 50 MG PO TB24: 50.0000 mg | ORAL_TABLET | Freq: Two times a day (BID) | ORAL | 0 refills | Status: DC

## 2018-01-27 MED ORDER — TRAMADOL HCL 50 MG PO TABS: 50.0000 mg | ORAL_TABLET | Freq: Four times a day (QID) | ORAL | 0 refills | Status: DC | PRN

## 2018-01-27 MED ORDER — NYSTATIN 100000 UNIT/GM EX POWD: Freq: Three times a day (TID) | CUTANEOUS | 0 refills | Status: DC

## 2018-01-27 MED ORDER — FLUCONAZOLE 100 MG PO TABS: 100.0000 mg | ORAL_TABLET | Freq: Every day | ORAL | 0 refills | Status: AC

## 2018-01-27 MED ORDER — POLYETHYLENE GLYCOL 3350 17 G PO PACK: 17.0000 g | PACK | Freq: Every day | ORAL | 0 refills | Status: DC

## 2018-01-27 NOTE — Progress Notes (Signed)
Called report to nurse Lolita Patella at [facility] and [facility].Marland Kitchen

## 2018-01-27 NOTE — Clinical Social Work Placement (Signed)
   CLINICAL SOCIAL WORK PLACEMENT  NOTE  Date:  01/27/2018  Patient Details  Name: Sandra [name] MRN: [id number] Date of Birth: Jun 06, 1940  Clinical Social Work is seeking post-discharge placement for this patient at the [place] level of care (*CSW will initial, date and re-position this form in  chart as items are completed):  Yes   Patient/family provided with [place] Work Department's list of facilities offering this level of care within the geographic area requested by the patient (or if unable, by the patient's family).  Yes   Patient/family informed of their freedom to choose among providers that offer the needed level of care, that participate in Medicare, Medicaid or managed care program needed by the patient, have an available bed and are willing to accept the patient.      Patient/family informed of [place]'s ownership interest in [facility] and [facility], as well as of the fact that they are under no obligation to receive care at these facilities.  PASRR submitted to EDS on       PASRR number received on (patient from [place])     [place] number confirmed on       [place] transmitted to all facilities in geographic area requested by pt/family on       FL2 transmitted to all facilities within larger geographic area on       Patient informed that his/her managed care company has contracts with or will negotiate with certain facilities, including the following:        Yes   Patient/family informed of bed offers received.  Patient chooses bed at [facility])     Physician recommends and patient chooses bed at      Patient to be transferred to [facility] SNF) on 01/27/18.  Patient to be transferred to facility by [facility]     Patient family notified on 01/27/18 of transfer.  Name of family member notified:  Mickel Baas (daughter and POA)     PHYSICIAN       Additional Comment:     _______________________________________________ Alberteen Sam, LCSW 01/27/2018, 10:15 AM

## 2018-01-27 NOTE — Plan of Care (Signed)
  Problem: Education: Goal: Knowledge of General Education information will improve Description Including pain rating scale, medication(s)/side effects and non-pharmacologic comfort measures Outcome: Progressing   Problem: Health Behavior/Discharge Planning: Goal: Ability to manage health-related needs will improve Outcome: Progressing   

## 2018-01-27 NOTE — Progress Notes (Signed)
IV removed and pt tolerated well. AVS prepared and awaiting EMS. Daughter has been made aware that EMS has yet to arrive.

## 2018-01-27 NOTE — Progress Notes (Signed)
Physical Therapy Treatment Patient Details Name: Sandra [name] MRN: [id number] DOB: 21-Aug-1940 Today's Date: 01/27/2018    History of Present Illness 77 year old female status post fall approximately 7 days ago and then a secondary fall with worsening of her symptoms 5 days ago. Imaging revealed T12 compression fracture with retropulsion and stenosis. PMH significant for PE, mitral regurgitation, HTN, DVT, DM.    PT Comments    Pt progressing towards physical therapy goals. Pain is the main limiting factor during mobility. She required total assist to don brace and adjust properly. Pt had difficulty getting comfortable in the recliner chair however positioned with pillows for optimal support until nursing staff could come change out wet bed linen. Encouraged OOB as much as pt could tolerate to decrease risk of PNA. Will continue to follow and progress as able per POC.    Follow Up Recommendations  SNF;Supervision/Assistance - 24 hour     Equipment Recommendations  None recommended by PT    Recommendations for Other Services       Precautions / Restrictions Precautions Precautions: Fall;Back Precaution Booklet Issued: No Precaution Comments: Will need further education on back precautions.  Required Braces or Orthoses: Spinal Brace Spinal Brace: Thoracolumbosacral orthotic;Applied in sitting position(Can remove for showering) Restrictions Weight Bearing Restrictions: No    Mobility  Bed Mobility Overal bed mobility: Needs Assistance Bed Mobility: Rolling;Sidelying to Sit Rolling: Mod assist;+2 for physical assistance Sidelying to sit: Mod assist;+2 for physical assistance;HOB elevated       General bed mobility comments: Assist for every aspect of bed mobility. Pt was educated on logroll technique but required hands on cueing to maintain back precautions throughout.   Transfers Overall transfer level: Needs assistance Equipment used: Rolling walker (2  wheeled) Transfers: Sit to/from [facility] Sit to Stand: Mod assist;+2 physical assistance;From elevated surface Stand pivot transfers: Mod assist;+2 physical assistance;From elevated surface       General transfer comment: VC's for hand placement on seated surface for safety.  Ambulation/Gait             General Gait Details: Unable   Stairs             Wheelchair Mobility    Modified Rankin (Stroke Patients Only)       Balance Overall balance assessment: Needs assistance Sitting-balance support: Bilateral upper extremity supported Sitting balance-Leahy Scale: Poor Sitting balance - Comments: assist required to maintain sitting balance.  Postural control: Posterior lean Standing balance support: Bilateral upper extremity supported;During functional activity Standing balance-Leahy Scale: Zero Standing balance comment: +2 assist required                            Cognition Arousal/Alertness: Awake/alert Behavior During Therapy: Flat affect Overall Cognitive Status: Impaired/Different from baseline Area of Impairment: Attention;Memory;Following commands;Safety/judgement;Awareness;Problem solving                   Current Attention Level: Sustained Memory: Decreased short-term memory;Decreased recall of precautions Following Commands: Follows one step commands consistently;Follows one step commands with increased time;Follows multi-step commands inconsistently Safety/Judgement: Decreased awareness of safety Awareness: Intellectual Problem Solving: Slow processing;Decreased initiation;Difficulty sequencing;Requires verbal cues        Exercises      General Comments        Pertinent Vitals/Pain Pain Assessment: Faces Faces Pain Scale: Hurts whole lot Pain Location: Back Pain Descriptors / Indicators: Discomfort;Grimacing Pain Intervention(s): Monitored during session;Limited activity within patient's  tolerance;Repositioned  Home Living                      Prior Function            PT Goals (current goals can now be found in the care plan section) Acute Rehab PT Goals Patient Stated Goal: Decrease pain; get back in the bed PT Goal Formulation: Patient unable to participate in goal setting Time For Goal Achievement: 02/02/18 Potential to Achieve Goals: Good Progress towards PT goals: Progressing toward goals    Frequency    Min 5X/week      PT Plan Current plan remains appropriate    Co-evaluation              AM-PAC PT "6 Clicks" Daily Activity  Outcome Measure  Difficulty turning over in bed (including adjusting bedclothes, sheets and blankets)?: Unable Difficulty moving from lying on back to sitting on the side of the bed? : Unable Difficulty sitting down on and standing up from a chair with arms (e.g., wheelchair, bedside commode, etc,.)?: Unable Help needed moving to and from a bed to chair (including a wheelchair)?: Total Help needed walking in hospital room?: Total Help needed climbing 3-5 steps with a railing? : Total 6 Click Score: 6    End of Session Equipment Utilized During Treatment: Gait belt;Back brace Activity Tolerance: Patient limited by lethargy Patient left: in bed;with call bell/phone within reach;with nursing/sitter in room;with bed alarm set Nurse Communication: Mobility status;Other (comment)(Macerated skin with some bleeding areas on bottom) PT Visit Diagnosis: Unsteadiness on feet (R26.81);Pain;Difficulty in walking, not elsewhere classified (R26.2) Pain - part of body: (back)     Time: 1030-1101 PT Time Calculation (min) (ACUTE ONLY): 31 min  Charges:  $Gait Training: 23-37 mins                     Rolinda Roan, PT, DPT Acute Rehabilitation Services Pager: [phone] Office: [phone]    Thelma Comp 01/27/2018, 1:19 PM

## 2018-01-27 NOTE — Discharge Summary (Signed)
Discharge Summary  Sandra [name] YTK:[id number] DOB: [birth year]  PCP: Glenda Chroman, MD  Admit date: 01/23/2018 Discharge date: 01/27/2018  Time spent: 1mins, more than 50% time spent on coordination of care.  Recommendations for Outpatient Follow-up:  1. F/u with SNF MD for hospital discharge follow up, repeat cbc/bmp at follow up 2. F/u with neurosurgery Dr Zada Finders in 6 weeks to repeat back x ray 3. F/u with neurology for dementia management  Discharge Diagnoses:  Active Hospital Problems   Diagnosis Date Noted  . Compressed spine fracture ([place]) 01/23/2018  . Uncontrolled type 2 diabetes mellitus with complication, with long-term current use of insulin ([place]) 05/18/2015  . Palpitations 08/21/2011  . Mitral regurgitation   . Left leg DVT ([place]) 04/27/2011    Resolved Hospital Problems  No resolved problems to display.    Discharge Condition: stable  Diet recommendation: heart healthy/carb modified  Filed Weights   01/26/18 2145  Weight: 77.9 kg    History of present illness: (per admitting MD Dr Candiss Norse) Sandra [name]  is a 77 y.o. female, with history of essential hypertension, palpitations, DM type 2 insulin-dependent, morbid obesity, recent multiple falls, DVT/PE several years ago currently on Xarelto, who had a fall at home which sounds mechanical few days ago after which she started experiencing low back pain, she then went to [facility] where she was found to have T12 compression fracture with 40% height loss and about 5 to 6 mm bulge towards the cord, there was no neurosurgery coverage, patient was neurologically intact, case was discussed by the MD at [facility] with neurosurgeon Dr. Venetia Constable who requested a transfer to [facility] with hospitalist admission.  Currently besides dull low back pain which is currently nonradiating worse with movement better with rest, ongoing for 2 to 3 days since her fall, no other associated symptoms, is  symptom-free.  Hospital Course:  Principal Problem:   Compressed spine fracture ([place]) Active Problems:   Left leg DVT (H[facility])   Mitral regurgitation   Palpitations   Uncontrolled type 2 diabetes mellitus with complication, with long-term current use of insulin ([facility])   T12 spine fracture with reported 40% height loss and 5 to 6 mm bulge towards the spine.  -with h/o frequent falls -neurologically intact, no bowel incontinence, chronic bladder incontinence.  -Neurosurgery Dr Colette Ribas  Consulted, no plan for surgery as of now, ok with activity with back brace and resume xarelto, -f/u with Dr Zada Finders in 6 weeks to repeat x ray - neurosurgery input appreciated  Delirium, Encephalopathy? With h/o dementia ( family confirmed patient was diagnosed with dementia two years ago, they noticed increased confusion last months, they prefer snf placement) No fever, no leukocytosis Frequent reorientation She was briefly on seroquel in the hospital with good result.  She is discharged on home meds cymbalta and lamictal. SNF MD continue monitor, meds adjustment as needed Refer to neurology for dementia eval and management  Candida cystitis: Urine culture >100,000yeast In a patient with insulin dependent dm2 and confusion Will treat with diflucan x5 days SNF MD to follow.   Hypokalemia/hypomagnesemia: Replaced and normalized  Insulin dependent dm2 a1c 8.5 Continue insulin and metformin F/u with pcp   HTN: continue home meds toprol-xl She was previously on norvasc this was discontinued by her pcp, she has been on toprol-xl bid at home. Further bp meds adjustment per snf MD and PCP.  H/o  recurrent DVT/PE   xarelto held on presentation, ok to resume per neurosurgery, no plan  for surgery for now   Frequent falls/poor balance Diabetes neuropathy? PT eval, snf placement  H/o depression Reports she is diagnosed with depression for 4 yrs Currently denies SI/HI She is on  cymbalta /lamictal at home. She is also on celexa at home which is discontinued to simplify regimen.   Code Status: full  Family Communication: patient and daughter over the phone  Disposition Plan:  snf    Consultants:  Neurosurgery Dr Zada Finders  Procedures:  none  Antibiotics:  none    Discharge Exam: BP (!) 161/68 (BP Location: Left Arm)   Pulse (!) 29   Temp 98.7 F (37.1 C) (Oral)   Resp 18   Ht 5\' 4"  (1.626 m)   Wt 77.9 kg   SpO2 99%   BMI 29.48 kg/m   General: NAD, calm , alert and interactive, not oriented to time, know she is in the hospital Cardiovascular: RRR Respiratory: CTABL  Discharge Instructions You were cared for by a hospitalist during your hospital stay. If you have any questions about your discharge medications or the care you received while you were in the hospital after you are discharged, you can call the unit and asked to speak with the hospitalist on call if the hospitalist that took care of you is not available. Once you are discharged, your primary care physician will handle any further medical issues. Please note that NO REFILLS for any discharge medications will be authorized once you are discharged, as it is imperative that you return to your primary care physician (or establish a relationship with a primary care physician if you do not have one) for your aftercare needs so that they can reassess your need for medications and monitor your lab values.  Discharge Instructions    Diet general   Complete by:  As directed    Increase activity slowly   Complete by:  As directed    TLSO brace when out of bed     Allergies as of 01/27/2018      Reactions   Buprenex [buprenorphine] Nausea And Vomiting   Namenda [memantine Hcl] Other (See Comments)   Sores in the mouth   Other Other (See Comments)   Shot for pain caused severe nausea. Had to spend the night in the hospital. Shot to numb mouth at the dentist = made her feel like she  was smothering.       Medication List    STOP taking these medications   citalopram 20 MG tablet Commonly known as:  CELEXA   nitrofurantoin (macrocrystal-monohydrate) 100 MG capsule Commonly known as:  MACROBID   oxybutynin 5 MG tablet Commonly known as:  DITROPAN     TAKE these medications   DULoxetine 60 MG capsule Commonly known as:  CYMBALTA Take 60 mg by mouth daily.   ENDIT EX Apply 1 application topically See admin instructions. Apply topically to buttocks after every bathroom visit.   fluconazole 100 MG tablet Commonly known as:  DIFLUCAN Take 1 tablet (100 mg total) by mouth daily for 4 days. Start taking on:  01/28/2018   Insulin Glargine 300 UNIT/ML Sopn Inject 38 Units into the skin every morning. What changed:    how much to take  when to take this   insulin lispro 100 UNIT/ML KiwkPen Commonly known as:  HUMALOG Inject 0.16-0.2 mLs (16-20 Units total) into the skin 3 (three) times daily. What changed:    how much to take  when to take this   lamoTRIgine 25 MG  tablet Commonly known as:  LAMICTAL Take 25 mg by mouth at bedtime.   metFORMIN 500 MG tablet Commonly known as:  GLUCOPHAGE TAKE 1 TABLET BY MOUTH  TWICE A DAY What changed:    how much to take  when to take this  additional instructions   metoprolol succinate 50 MG 24 hr tablet Commonly known as:  TOPROL-XL Take 1 tablet (50 mg total) by mouth 2 (two) times daily.   multivitamin with minerals Tabs tablet Take 1 tablet by mouth daily.   nystatin powder Commonly known as:  MYCOSTATIN/NYSTOP Apply topically 3 (three) times daily.   pantoprazole 40 MG tablet Commonly known as:  PROTONIX TAKE ONE TABLET DAILY   polyethylene glycol packet Commonly known as:  MIRALAX / GLYCOLAX Take 17 g by mouth daily.   rivaroxaban 20 MG Tabs tablet Commonly known as:  XARELTO Take 20 mg by mouth at bedtime.   senna-docusate 8.6-50 MG tablet Commonly known as:  Senokot-S Take 1  tablet by mouth at bedtime.   traMADol 50 MG tablet Commonly known as:  ULTRAM Take 1 tablet (50 mg total) by mouth every 6 (six) hours as needed for moderate pain or severe pain.   Vitamin D-3 1000 units Caps Take 1 capsule (1,000 Units total) by mouth daily.      Allergies  Allergen Reactions  . Buprenex [Buprenorphine] Nausea And Vomiting  . Namenda [Memantine Hcl] Other (See Comments)    Sores in the mouth  . Other Other (See Comments)    Shot for pain caused severe nausea. Had to spend the night in the hospital. Shot to numb mouth at the dentist = made her feel like she was smothering.     Contact information for follow-up providers    Judith Part, MD Follow up in 6 week(s).   Specialty:  Neurosurgery Why:  repeat back x ray Contact information: [address] [place] [address] [phone]        Glenda Chroman, MD Follow up in 1 week(s).   Specialty:  Internal Medicine Contact information: [place] [address] [phone]            Contact information for after-discharge care    Destination    [facility] .   Service:  Skilled Nursing Contact information: [address] [place] [phone]                   The results of significant diagnostics from this hospitalization (including imaging, microbiology, ancillary and laboratory) are listed below for reference.    Significant Diagnostic Studies: Ct Head Wo Contrast  Result Date: 01/25/2018 CLINICAL DATA:  77 year old female with encephalopathy. Diagnosed with dementia but recent increased confusion. EXAM: CT HEAD WITHOUT CONTRAST TECHNIQUE: Contiguous axial images were obtained from the base of the skull through the vertex without intravenous contrast. COMPARISON:  [facility] Head CT 07/03/2016. brain MRI 06/03/2016. FINDINGS: Brain: Stable cerebral volume since 2018.  Confluent bilateral anterior frontal lobe white matter hypodensity. Small area of superimposed chronic cortical encephalomalacia in the right anterior frontal lobe (series 3, image 20). 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 No midline shift, ventriculomegaly, mass effect, evidence of mass lesion, intracranial hemorrhage or evidence of cortically based acute infarction. Vascular: Calcified atherosclerosis at the skull base. No suspicious intracranial vascular hyperdensity. Skull: No acute osseous abnormality identified. Sinuses/Orbits: Visualized paranasal sinuses and  mastoids are stable and well pneumatized. Other: No acute orbit or scalp soft tissue findings. IMPRESSION: 1.  No acute intracranial abnormality. 2. 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 Electronically Signed   By: Genevie Ann M.D.   On: 01/25/2018 14:24   Mr Lumbar Spine Wo Contrast  Result Date: 01/25/2018 CLINICAL DATA:  Acute onset severe back pain when the patient suffered a syncopal episode with a fall 01/22/2018. Acute T12 compression fracture on prior CT. EXAM: MRI LUMBAR SPINE WITHOUT CONTRAST TECHNIQUE: Multiplanar, multisequence MR imaging of the lumbar spine was performed. No intravenous contrast was administered. COMPARISON:  CT lumbar spine 01/22/2018 FINDINGS: Segmentation:  Standard. Alignment: There is straightening of the normal cervical lordosis and trace retrolisthesis L3 on L4 and L4 on L5. Vertebrae: Mildly comminuted T12 compression fracture with vertebral body height loss anteriorly of up to 90% is identified. There is marrow edema within the vertebral body consistent with acute injury. Edema extends into the pedicles but no fracture of the pedicles is identified. Marrow signal in the facets and lamina is normal. Mild, remote inferior endplate compression  fracture of L2 is noted. No other fracture. Degenerative endplate signal change J8-2 and L4-5 is identified. Conus medullaris and cauda equina: Conus extends to the T12-L1 level. Conus and cauda equina appear normal. Paraspinal and other soft tissues: Negative. Disc levels: T10-11 is imaged in the sagittal plane only and negative. T12-L1: Bony retropulsion off the superior endplate of N05 effaces the ventral thecal sac and slightly deforms the ventral cord. No frank cord compression. The foramina are open. T12-L1: Negative. L1-2: Negative. L2-3: Shallow disc bulge and mild-to-moderate facet degenerative disease without stenosis. L3-4: Loss of disc space height is seen. Shallow disc bulge and endplate spur are identified. There is mild central canal and right subarticular recess narrowing. Mild right foraminal narrowing is also seen. The left foramen is open. L4-5: Shallow disc bulge with a superimposed small left lateral recess and foraminal protrusion. There is mild narrowing in the left lateral recess. Mild to moderate bilateral foraminal narrowing is seen. L5-S1: Minimal disc bulge and mild facet degenerative disease. No stenosis. IMPRESSION: Acute or subacute mildly comminuted compression fracture of T12 has an appearance consistent with a posttraumatic or senile osteoporotic injury. Bony retropulsion off the superior endplate effaces the ventral thecal sac and slightly deforms the ventral cord. No cord signal abnormality. Mild left lateral recess and mild to moderate bilateral foraminal narrowing L4-5. Mild central canal, right subarticular recess and right foraminal narrowing L3-4. Electronically Signed   By: Inge Rise M.D.   On: 01/25/2018 10:47   Dg Chest Port 1 View  Result Date: 01/23/2018 CLINICAL DATA:  Multiple falls. Hypertension. Slight shortness of breath. EXAM: PORTABLE CHEST 1 VIEW COMPARISON:  06/03/2016 FINDINGS: 1757 hours. The lungs are clear without focal pneumonia, edema,  pneumothorax or pleural effusion. The cardiopericardial silhouette is within normal limits for size. Distal left clavicle fracture noted. Bones are diffusely demineralized. IMPRESSION: No active disease. Electronically Signed   By: Misty Stanley M.D.   On: 01/23/2018 18:25    Microbiology: Recent Results (from the past 240 hour(s))  Culture, Urine     Status: Abnormal   Collection Time: 01/25/18  5:01 AM  Result Value Ref Range Status   Specimen Description URINE, RANDOM  Final   Special Requests   Final    NONE Performed at  [facility], [place] [address], [place], [place] [address]    Culture >=100,000 COLONIES/mL YEAST (A)  Final   Report Status 01/26/2018 FINAL  Final     Labs: Basic Metabolic Panel: Recent Labs  Lab 01/23/18 1843 01/24/18 0523 01/25/18 0734 01/26/18 0642 01/27/18 0531  NA 136 136 134* 138 138  K 3.6 3.5 4.3 4.2 4.5  CL 100 100 103 106 106  CO2 25 25 22 23 24   GLUCOSE 205* 167* 121* 96 187*  BUN 16 12 12 14 15   CREATININE 0.75 0.67 0.67 0.73 0.85  CALCIUM 8.3* 8.3* 8.4* 8.4* 8.4*  MG  --  1.2* 1.7 1.7 2.0   Liver Function Tests: No results for input(s): AST, ALT, ALKPHOS, BILITOT, PROT, ALBUMIN in the last 168 hours. No results for input(s): LIPASE, AMYLASE in the last 168 hours. No results for input(s): AMMONIA in the last 168 hours. CBC: Recent Labs  Lab 01/23/18 1843 01/24/18 0523 01/26/18 0642  WBC 7.2 9.2 7.1  NEUTROABS  --   --  3.6  HGB 11.0* 11.5* 12.0  HCT 34.6* 36.6 38.7  MCV 86.1 86.3 87.2  PLT 141* 168 169   Cardiac Enzymes: No results for input(s): CKTOTAL, CKMB, CKMBINDEX, TROPONINI in the last 168 hours. BNP: BNP (last 3 results) No results for input(s): BNP in the last 8760 hours.  ProBNP (last 3 results) No results for input(s): PROBNP in the last 8760 hours.  CBG: Recent Labs  Lab 01/25/18 2300 01/26/18 0629 01/26/18 1123 01/26/18 1610 01/26/18 2137  GLUCAP 245* 103* 179* 176* 135*        Signed:  Florencia Reasons MD, PhD  [facility] 01/27/2018, 10:06 AM

## 2018-01-27 NOTE — Progress Notes (Signed)
Patient will DC to:[facility] Anticipated DC date: 01/27/18 Family notified: Daughter Mickel Baas) Transport CM:[id number]  Per MD patient ready for DC to [facility] . RN, patient, patient's family, and facility notified of DC. Discharge Summary sent to facility. RN given number for report [phone]. DC packet on chart. Ambulance transport requested for patient for 2:00 pm.  CSW signing off.  [place], [place]

## 2018-01-28 DIAGNOSIS — M1990 Unspecified osteoarthritis, unspecified site: Secondary | ICD-10-CM | POA: Diagnosis not present

## 2018-01-28 DIAGNOSIS — E119 Type 2 diabetes mellitus without complications: Secondary | ICD-10-CM | POA: Diagnosis not present

## 2018-01-28 DIAGNOSIS — S22089A Unspecified fracture of T11-T12 vertebra, initial encounter for closed fracture: Secondary | ICD-10-CM | POA: Diagnosis not present

## 2018-01-28 DIAGNOSIS — F0390 Unspecified dementia without behavioral disturbance: Secondary | ICD-10-CM | POA: Diagnosis not present

## 2018-01-28 LAB — GLUCOSE, CAPILLARY: Glucose-Capillary: 162 mg/dL — ABNORMAL HIGH (ref 70–99)

## 2018-02-26 DIAGNOSIS — S22008A Other fracture of unspecified thoracic vertebra, initial encounter for closed fracture: Secondary | ICD-10-CM | POA: Diagnosis not present

## 2018-03-02 DIAGNOSIS — E1140 Type 2 diabetes mellitus with diabetic neuropathy, unspecified: Secondary | ICD-10-CM | POA: Diagnosis not present

## 2018-03-02 DIAGNOSIS — E1165 Type 2 diabetes mellitus with hyperglycemia: Secondary | ICD-10-CM | POA: Diagnosis not present

## 2018-03-18 DIAGNOSIS — M810 Age-related osteoporosis without current pathological fracture: Secondary | ICD-10-CM | POA: Diagnosis not present

## 2018-03-18 DIAGNOSIS — E1165 Type 2 diabetes mellitus with hyperglycemia: Secondary | ICD-10-CM | POA: Diagnosis not present

## 2018-03-18 DIAGNOSIS — S22080A Wedge compression fracture of T11-T12 vertebra, initial encounter for closed fracture: Secondary | ICD-10-CM | POA: Diagnosis not present

## 2018-03-20 DIAGNOSIS — Z79899 Other long term (current) drug therapy: Secondary | ICD-10-CM | POA: Diagnosis not present

## 2018-03-20 DIAGNOSIS — I10 Essential (primary) hypertension: Secondary | ICD-10-CM | POA: Diagnosis not present

## 2018-03-20 DIAGNOSIS — Z86711 Personal history of pulmonary embolism: Secondary | ICD-10-CM | POA: Diagnosis not present

## 2018-03-20 DIAGNOSIS — E1140 Type 2 diabetes mellitus with diabetic neuropathy, unspecified: Secondary | ICD-10-CM | POA: Diagnosis not present

## 2018-03-20 DIAGNOSIS — M8008XD Age-related osteoporosis with current pathological fracture, vertebra(e), subsequent encounter for fracture with routine healing: Secondary | ICD-10-CM | POA: Diagnosis not present

## 2018-03-20 DIAGNOSIS — Z8673 Personal history of transient ischemic attack (TIA), and cerebral infarction without residual deficits: Secondary | ICD-10-CM | POA: Diagnosis not present

## 2018-03-20 DIAGNOSIS — E1165 Type 2 diabetes mellitus with hyperglycemia: Secondary | ICD-10-CM | POA: Diagnosis not present

## 2018-03-20 DIAGNOSIS — M5137 Other intervertebral disc degeneration, lumbosacral region: Secondary | ICD-10-CM | POA: Diagnosis not present

## 2018-03-20 DIAGNOSIS — Z794 Long term (current) use of insulin: Secondary | ICD-10-CM | POA: Diagnosis not present

## 2018-03-20 DIAGNOSIS — M1990 Unspecified osteoarthritis, unspecified site: Secondary | ICD-10-CM | POA: Diagnosis not present

## 2018-03-20 DIAGNOSIS — Z7901 Long term (current) use of anticoagulants: Secondary | ICD-10-CM | POA: Diagnosis not present

## 2018-03-20 DIAGNOSIS — F0390 Unspecified dementia without behavioral disturbance: Secondary | ICD-10-CM | POA: Diagnosis not present

## 2018-03-20 DIAGNOSIS — Z9181 History of falling: Secondary | ICD-10-CM | POA: Diagnosis not present

## 2018-03-23 DIAGNOSIS — I69328 Other speech and language deficits following cerebral infarction: Secondary | ICD-10-CM | POA: Diagnosis not present

## 2018-03-23 DIAGNOSIS — R110 Nausea: Secondary | ICD-10-CM | POA: Diagnosis not present

## 2018-03-23 DIAGNOSIS — N3000 Acute cystitis without hematuria: Secondary | ICD-10-CM | POA: Diagnosis not present

## 2018-03-23 DIAGNOSIS — I10 Essential (primary) hypertension: Secondary | ICD-10-CM | POA: Diagnosis not present

## 2018-03-23 DIAGNOSIS — Z86711 Personal history of pulmonary embolism: Secondary | ICD-10-CM | POA: Diagnosis not present

## 2018-03-23 DIAGNOSIS — I825Z9 Chronic embolism and thrombosis of unspecified deep veins of unspecified distal lower extremity: Secondary | ICD-10-CM | POA: Diagnosis not present

## 2018-03-23 DIAGNOSIS — Z794 Long term (current) use of insulin: Secondary | ICD-10-CM | POA: Diagnosis not present

## 2018-03-23 DIAGNOSIS — R531 Weakness: Secondary | ICD-10-CM | POA: Diagnosis not present

## 2018-03-23 DIAGNOSIS — Z7901 Long term (current) use of anticoagulants: Secondary | ICD-10-CM | POA: Diagnosis not present

## 2018-03-23 DIAGNOSIS — Z86718 Personal history of other venous thrombosis and embolism: Secondary | ICD-10-CM | POA: Diagnosis not present

## 2018-03-23 DIAGNOSIS — I491 Atrial premature depolarization: Secondary | ICD-10-CM | POA: Diagnosis not present

## 2018-03-23 DIAGNOSIS — F331 Major depressive disorder, recurrent, moderate: Secondary | ICD-10-CM | POA: Diagnosis not present

## 2018-03-23 DIAGNOSIS — I674 Hypertensive encephalopathy: Secondary | ICD-10-CM | POA: Diagnosis not present

## 2018-03-23 DIAGNOSIS — R471 Dysarthria and anarthria: Secondary | ICD-10-CM | POA: Diagnosis not present

## 2018-03-23 DIAGNOSIS — Z9119 Patient's noncompliance with other medical treatment and regimen: Secondary | ICD-10-CM | POA: Diagnosis not present

## 2018-03-23 DIAGNOSIS — F339 Major depressive disorder, recurrent, unspecified: Secondary | ICD-10-CM | POA: Diagnosis not present

## 2018-03-23 DIAGNOSIS — I63532 Cerebral infarction due to unspecified occlusion or stenosis of left posterior cerebral artery: Secondary | ICD-10-CM | POA: Diagnosis not present

## 2018-03-23 DIAGNOSIS — I639 Cerebral infarction, unspecified: Secondary | ICD-10-CM | POA: Diagnosis not present

## 2018-03-23 DIAGNOSIS — I341 Nonrheumatic mitral (valve) prolapse: Secondary | ICD-10-CM | POA: Diagnosis present

## 2018-03-23 DIAGNOSIS — Z8673 Personal history of transient ischemic attack (TIA), and cerebral infarction without residual deficits: Secondary | ICD-10-CM | POA: Diagnosis not present

## 2018-03-23 DIAGNOSIS — E1165 Type 2 diabetes mellitus with hyperglycemia: Secondary | ICD-10-CM | POA: Diagnosis not present

## 2018-03-23 DIAGNOSIS — R918 Other nonspecific abnormal finding of lung field: Secondary | ICD-10-CM | POA: Diagnosis not present

## 2018-03-23 DIAGNOSIS — M6281 Muscle weakness (generalized): Secondary | ICD-10-CM | POA: Diagnosis not present

## 2018-03-23 DIAGNOSIS — I69311 Memory deficit following cerebral infarction: Secondary | ICD-10-CM | POA: Diagnosis not present

## 2018-03-23 DIAGNOSIS — R4182 Altered mental status, unspecified: Secondary | ICD-10-CM | POA: Diagnosis not present

## 2018-03-23 DIAGNOSIS — R32 Unspecified urinary incontinence: Secondary | ICD-10-CM | POA: Diagnosis present

## 2018-03-23 DIAGNOSIS — E559 Vitamin D deficiency, unspecified: Secondary | ICD-10-CM | POA: Diagnosis present

## 2018-03-23 DIAGNOSIS — I4891 Unspecified atrial fibrillation: Secondary | ICD-10-CM | POA: Diagnosis not present

## 2018-03-23 DIAGNOSIS — I480 Paroxysmal atrial fibrillation: Secondary | ICD-10-CM | POA: Diagnosis not present

## 2018-03-23 DIAGNOSIS — B3749 Other urogenital candidiasis: Secondary | ICD-10-CM | POA: Diagnosis not present

## 2018-03-23 DIAGNOSIS — F0390 Unspecified dementia without behavioral disturbance: Secondary | ICD-10-CM | POA: Diagnosis present

## 2018-03-26 DIAGNOSIS — E1165 Type 2 diabetes mellitus with hyperglycemia: Secondary | ICD-10-CM | POA: Diagnosis not present

## 2018-03-27 DIAGNOSIS — I69328 Other speech and language deficits following cerebral infarction: Secondary | ICD-10-CM | POA: Diagnosis not present

## 2018-03-27 DIAGNOSIS — W050XXA Fall from non-moving wheelchair, initial encounter: Secondary | ICD-10-CM | POA: Diagnosis not present

## 2018-03-27 DIAGNOSIS — E1140 Type 2 diabetes mellitus with diabetic neuropathy, unspecified: Secondary | ICD-10-CM | POA: Diagnosis not present

## 2018-03-27 DIAGNOSIS — R52 Pain, unspecified: Secondary | ICD-10-CM | POA: Diagnosis not present

## 2018-03-27 DIAGNOSIS — J209 Acute bronchitis, unspecified: Secondary | ICD-10-CM | POA: Diagnosis not present

## 2018-03-27 DIAGNOSIS — M8000XD Age-related osteoporosis with current pathological fracture, unspecified site, subsequent encounter for fracture with routine healing: Secondary | ICD-10-CM | POA: Diagnosis not present

## 2018-03-27 DIAGNOSIS — R1032 Left lower quadrant pain: Secondary | ICD-10-CM | POA: Diagnosis not present

## 2018-03-27 DIAGNOSIS — I69311 Memory deficit following cerebral infarction: Secondary | ICD-10-CM | POA: Diagnosis not present

## 2018-03-27 DIAGNOSIS — Z7901 Long term (current) use of anticoagulants: Secondary | ICD-10-CM | POA: Diagnosis not present

## 2018-03-27 DIAGNOSIS — M6281 Muscle weakness (generalized): Secondary | ICD-10-CM | POA: Diagnosis not present

## 2018-03-27 DIAGNOSIS — W19XXXA Unspecified fall, initial encounter: Secondary | ICD-10-CM | POA: Diagnosis not present

## 2018-03-27 DIAGNOSIS — I6782 Cerebral ischemia: Secondary | ICD-10-CM | POA: Diagnosis not present

## 2018-03-27 DIAGNOSIS — Z794 Long term (current) use of insulin: Secondary | ICD-10-CM | POA: Diagnosis not present

## 2018-03-27 DIAGNOSIS — M8008XD Age-related osteoporosis with current pathological fracture, vertebra(e), subsequent encounter for fracture with routine healing: Secondary | ICD-10-CM | POA: Diagnosis not present

## 2018-03-27 DIAGNOSIS — Z7984 Long term (current) use of oral hypoglycemic drugs: Secondary | ICD-10-CM | POA: Diagnosis not present

## 2018-03-27 DIAGNOSIS — F0390 Unspecified dementia without behavioral disturbance: Secondary | ICD-10-CM | POA: Diagnosis not present

## 2018-03-27 DIAGNOSIS — R4182 Altered mental status, unspecified: Secondary | ICD-10-CM | POA: Diagnosis not present

## 2018-03-27 DIAGNOSIS — N390 Urinary tract infection, site not specified: Secondary | ICD-10-CM | POA: Diagnosis not present

## 2018-03-27 DIAGNOSIS — N3001 Acute cystitis with hematuria: Secondary | ICD-10-CM | POA: Diagnosis not present

## 2018-03-27 DIAGNOSIS — S0990XA Unspecified injury of head, initial encounter: Secondary | ICD-10-CM | POA: Diagnosis not present

## 2018-03-27 DIAGNOSIS — E119 Type 2 diabetes mellitus without complications: Secondary | ICD-10-CM | POA: Diagnosis not present

## 2018-03-27 DIAGNOSIS — Z8673 Personal history of transient ischemic attack (TIA), and cerebral infarction without residual deficits: Secondary | ICD-10-CM | POA: Diagnosis not present

## 2018-03-27 DIAGNOSIS — I639 Cerebral infarction, unspecified: Secondary | ICD-10-CM | POA: Diagnosis not present

## 2018-03-27 DIAGNOSIS — B3749 Other urogenital candidiasis: Secondary | ICD-10-CM | POA: Diagnosis not present

## 2018-03-27 DIAGNOSIS — S22080D Wedge compression fracture of T11-T12 vertebra, subsequent encounter for fracture with routine healing: Secondary | ICD-10-CM | POA: Diagnosis not present

## 2018-03-27 DIAGNOSIS — I10 Essential (primary) hypertension: Secondary | ICD-10-CM | POA: Diagnosis not present

## 2018-03-27 DIAGNOSIS — F339 Major depressive disorder, recurrent, unspecified: Secondary | ICD-10-CM | POA: Diagnosis not present

## 2018-03-27 DIAGNOSIS — M549 Dorsalgia, unspecified: Secondary | ICD-10-CM | POA: Diagnosis not present

## 2018-03-27 DIAGNOSIS — R51 Headache: Secondary | ICD-10-CM | POA: Diagnosis not present

## 2018-03-27 DIAGNOSIS — I4891 Unspecified atrial fibrillation: Secondary | ICD-10-CM | POA: Diagnosis not present

## 2018-03-27 DIAGNOSIS — I674 Hypertensive encephalopathy: Secondary | ICD-10-CM | POA: Diagnosis not present

## 2018-03-27 DIAGNOSIS — I825Z9 Chronic embolism and thrombosis of unspecified deep veins of unspecified distal lower extremity: Secondary | ICD-10-CM | POA: Diagnosis not present

## 2018-03-27 DIAGNOSIS — E1165 Type 2 diabetes mellitus with hyperglycemia: Secondary | ICD-10-CM | POA: Diagnosis not present

## 2018-03-27 DIAGNOSIS — B370 Candidal stomatitis: Secondary | ICD-10-CM | POA: Diagnosis not present

## 2018-03-27 DIAGNOSIS — S3991XA Unspecified injury of abdomen, initial encounter: Secondary | ICD-10-CM | POA: Diagnosis not present

## 2018-03-27 DIAGNOSIS — M545 Low back pain: Secondary | ICD-10-CM | POA: Diagnosis not present

## 2018-03-30 DIAGNOSIS — I10 Essential (primary) hypertension: Secondary | ICD-10-CM | POA: Diagnosis not present

## 2018-03-30 DIAGNOSIS — E1165 Type 2 diabetes mellitus with hyperglycemia: Secondary | ICD-10-CM | POA: Diagnosis not present

## 2018-03-30 DIAGNOSIS — I639 Cerebral infarction, unspecified: Secondary | ICD-10-CM | POA: Diagnosis not present

## 2018-03-31 ENCOUNTER — Ambulatory Visit: Payer: Medicare Other | Admitting: Urology

## 2018-04-12 DIAGNOSIS — M545 Low back pain: Secondary | ICD-10-CM | POA: Diagnosis not present

## 2018-04-12 DIAGNOSIS — Z794 Long term (current) use of insulin: Secondary | ICD-10-CM | POA: Diagnosis not present

## 2018-04-12 DIAGNOSIS — N3001 Acute cystitis with hematuria: Secondary | ICD-10-CM | POA: Diagnosis not present

## 2018-04-12 DIAGNOSIS — M549 Dorsalgia, unspecified: Secondary | ICD-10-CM | POA: Diagnosis not present

## 2018-04-12 DIAGNOSIS — I10 Essential (primary) hypertension: Secondary | ICD-10-CM | POA: Diagnosis not present

## 2018-04-12 DIAGNOSIS — W050XXA Fall from non-moving wheelchair, initial encounter: Secondary | ICD-10-CM | POA: Diagnosis not present

## 2018-04-12 DIAGNOSIS — S3991XA Unspecified injury of abdomen, initial encounter: Secondary | ICD-10-CM | POA: Diagnosis not present

## 2018-04-12 DIAGNOSIS — S0990XA Unspecified injury of head, initial encounter: Secondary | ICD-10-CM | POA: Diagnosis not present

## 2018-04-12 DIAGNOSIS — Z7984 Long term (current) use of oral hypoglycemic drugs: Secondary | ICD-10-CM | POA: Diagnosis not present

## 2018-04-12 DIAGNOSIS — R1032 Left lower quadrant pain: Secondary | ICD-10-CM | POA: Diagnosis not present

## 2018-04-12 DIAGNOSIS — R51 Headache: Secondary | ICD-10-CM | POA: Diagnosis not present

## 2018-04-27 DIAGNOSIS — E119 Type 2 diabetes mellitus without complications: Secondary | ICD-10-CM | POA: Diagnosis not present

## 2018-04-27 DIAGNOSIS — B370 Candidal stomatitis: Secondary | ICD-10-CM | POA: Diagnosis not present

## 2018-04-27 DIAGNOSIS — J209 Acute bronchitis, unspecified: Secondary | ICD-10-CM | POA: Diagnosis not present

## 2018-04-29 DIAGNOSIS — M8000XD Age-related osteoporosis with current pathological fracture, unspecified site, subsequent encounter for fracture with routine healing: Secondary | ICD-10-CM | POA: Diagnosis not present

## 2018-05-13 DIAGNOSIS — I6782 Cerebral ischemia: Secondary | ICD-10-CM | POA: Diagnosis not present

## 2018-05-13 DIAGNOSIS — S22080D Wedge compression fracture of T11-T12 vertebra, subsequent encounter for fracture with routine healing: Secondary | ICD-10-CM | POA: Diagnosis not present

## 2018-05-17 DIAGNOSIS — M8008XD Age-related osteoporosis with current pathological fracture, vertebra(e), subsequent encounter for fracture with routine healing: Secondary | ICD-10-CM | POA: Diagnosis not present

## 2018-05-17 DIAGNOSIS — E1165 Type 2 diabetes mellitus with hyperglycemia: Secondary | ICD-10-CM | POA: Diagnosis not present

## 2018-05-17 DIAGNOSIS — E1140 Type 2 diabetes mellitus with diabetic neuropathy, unspecified: Secondary | ICD-10-CM | POA: Diagnosis not present

## 2018-05-17 DIAGNOSIS — F0390 Unspecified dementia without behavioral disturbance: Secondary | ICD-10-CM | POA: Diagnosis not present

## 2018-05-18 DIAGNOSIS — Z8673 Personal history of transient ischemic attack (TIA), and cerebral infarction without residual deficits: Secondary | ICD-10-CM | POA: Diagnosis not present

## 2018-05-18 DIAGNOSIS — E1165 Type 2 diabetes mellitus with hyperglycemia: Secondary | ICD-10-CM | POA: Diagnosis not present

## 2018-05-18 DIAGNOSIS — I10 Essential (primary) hypertension: Secondary | ICD-10-CM | POA: Diagnosis not present

## 2018-05-18 DIAGNOSIS — N390 Urinary tract infection, site not specified: Secondary | ICD-10-CM | POA: Diagnosis not present

## 2018-05-21 DIAGNOSIS — Z9181 History of falling: Secondary | ICD-10-CM | POA: Diagnosis not present

## 2018-05-21 DIAGNOSIS — Z8673 Personal history of transient ischemic attack (TIA), and cerebral infarction without residual deficits: Secondary | ICD-10-CM | POA: Diagnosis not present

## 2018-05-21 DIAGNOSIS — M6281 Muscle weakness (generalized): Secondary | ICD-10-CM | POA: Diagnosis not present

## 2018-05-21 DIAGNOSIS — Z794 Long term (current) use of insulin: Secondary | ICD-10-CM | POA: Diagnosis not present

## 2018-05-21 DIAGNOSIS — Z87440 Personal history of urinary (tract) infections: Secondary | ICD-10-CM | POA: Diagnosis not present

## 2018-05-21 DIAGNOSIS — I10 Essential (primary) hypertension: Secondary | ICD-10-CM | POA: Diagnosis not present

## 2018-05-21 DIAGNOSIS — Z7901 Long term (current) use of anticoagulants: Secondary | ICD-10-CM | POA: Diagnosis not present

## 2018-05-21 DIAGNOSIS — R2689 Other abnormalities of gait and mobility: Secondary | ICD-10-CM | POA: Diagnosis not present

## 2018-05-21 DIAGNOSIS — E1165 Type 2 diabetes mellitus with hyperglycemia: Secondary | ICD-10-CM | POA: Diagnosis not present

## 2018-05-25 DIAGNOSIS — I10 Essential (primary) hypertension: Secondary | ICD-10-CM | POA: Diagnosis not present

## 2018-05-25 DIAGNOSIS — M6281 Muscle weakness (generalized): Secondary | ICD-10-CM | POA: Diagnosis not present

## 2018-05-25 DIAGNOSIS — Z87440 Personal history of urinary (tract) infections: Secondary | ICD-10-CM | POA: Diagnosis not present

## 2018-05-25 DIAGNOSIS — Z8673 Personal history of transient ischemic attack (TIA), and cerebral infarction without residual deficits: Secondary | ICD-10-CM | POA: Diagnosis not present

## 2018-05-25 DIAGNOSIS — R2689 Other abnormalities of gait and mobility: Secondary | ICD-10-CM | POA: Diagnosis not present

## 2018-05-25 DIAGNOSIS — E1165 Type 2 diabetes mellitus with hyperglycemia: Secondary | ICD-10-CM | POA: Diagnosis not present

## 2018-05-26 DIAGNOSIS — Z8673 Personal history of transient ischemic attack (TIA), and cerebral infarction without residual deficits: Secondary | ICD-10-CM | POA: Diagnosis not present

## 2018-05-26 DIAGNOSIS — R2689 Other abnormalities of gait and mobility: Secondary | ICD-10-CM | POA: Diagnosis not present

## 2018-05-26 DIAGNOSIS — I10 Essential (primary) hypertension: Secondary | ICD-10-CM | POA: Diagnosis not present

## 2018-05-26 DIAGNOSIS — Z87440 Personal history of urinary (tract) infections: Secondary | ICD-10-CM | POA: Diagnosis not present

## 2018-05-26 DIAGNOSIS — M6281 Muscle weakness (generalized): Secondary | ICD-10-CM | POA: Diagnosis not present

## 2018-05-26 DIAGNOSIS — E1165 Type 2 diabetes mellitus with hyperglycemia: Secondary | ICD-10-CM | POA: Diagnosis not present

## 2018-05-27 DIAGNOSIS — Z8673 Personal history of transient ischemic attack (TIA), and cerebral infarction without residual deficits: Secondary | ICD-10-CM | POA: Diagnosis not present

## 2018-05-27 DIAGNOSIS — E1165 Type 2 diabetes mellitus with hyperglycemia: Secondary | ICD-10-CM | POA: Diagnosis not present

## 2018-05-27 DIAGNOSIS — Z87440 Personal history of urinary (tract) infections: Secondary | ICD-10-CM | POA: Diagnosis not present

## 2018-05-27 DIAGNOSIS — Z6829 Body mass index (BMI) 29.0-29.9, adult: Secondary | ICD-10-CM | POA: Diagnosis not present

## 2018-05-27 DIAGNOSIS — E1142 Type 2 diabetes mellitus with diabetic polyneuropathy: Secondary | ICD-10-CM | POA: Diagnosis not present

## 2018-05-27 DIAGNOSIS — I2699 Other pulmonary embolism without acute cor pulmonale: Secondary | ICD-10-CM | POA: Diagnosis not present

## 2018-05-27 DIAGNOSIS — M6281 Muscle weakness (generalized): Secondary | ICD-10-CM | POA: Diagnosis not present

## 2018-05-27 DIAGNOSIS — Z299 Encounter for prophylactic measures, unspecified: Secondary | ICD-10-CM | POA: Diagnosis not present

## 2018-05-27 DIAGNOSIS — R2689 Other abnormalities of gait and mobility: Secondary | ICD-10-CM | POA: Diagnosis not present

## 2018-05-27 DIAGNOSIS — I639 Cerebral infarction, unspecified: Secondary | ICD-10-CM | POA: Diagnosis not present

## 2018-05-27 DIAGNOSIS — I10 Essential (primary) hypertension: Secondary | ICD-10-CM | POA: Diagnosis not present

## 2018-05-28 DIAGNOSIS — I10 Essential (primary) hypertension: Secondary | ICD-10-CM | POA: Diagnosis not present

## 2018-05-28 DIAGNOSIS — R2689 Other abnormalities of gait and mobility: Secondary | ICD-10-CM | POA: Diagnosis not present

## 2018-05-28 DIAGNOSIS — E1165 Type 2 diabetes mellitus with hyperglycemia: Secondary | ICD-10-CM | POA: Diagnosis not present

## 2018-05-28 DIAGNOSIS — Z8673 Personal history of transient ischemic attack (TIA), and cerebral infarction without residual deficits: Secondary | ICD-10-CM | POA: Diagnosis not present

## 2018-05-28 DIAGNOSIS — M6281 Muscle weakness (generalized): Secondary | ICD-10-CM | POA: Diagnosis not present

## 2018-05-28 DIAGNOSIS — Z87440 Personal history of urinary (tract) infections: Secondary | ICD-10-CM | POA: Diagnosis not present

## 2018-05-29 DIAGNOSIS — M6281 Muscle weakness (generalized): Secondary | ICD-10-CM | POA: Diagnosis not present

## 2018-05-29 DIAGNOSIS — E1165 Type 2 diabetes mellitus with hyperglycemia: Secondary | ICD-10-CM | POA: Diagnosis not present

## 2018-05-29 DIAGNOSIS — Z87440 Personal history of urinary (tract) infections: Secondary | ICD-10-CM | POA: Diagnosis not present

## 2018-05-29 DIAGNOSIS — R2689 Other abnormalities of gait and mobility: Secondary | ICD-10-CM | POA: Diagnosis not present

## 2018-05-29 DIAGNOSIS — Z8673 Personal history of transient ischemic attack (TIA), and cerebral infarction without residual deficits: Secondary | ICD-10-CM | POA: Diagnosis not present

## 2018-05-29 DIAGNOSIS — I10 Essential (primary) hypertension: Secondary | ICD-10-CM | POA: Diagnosis not present

## 2018-06-01 DIAGNOSIS — Z8673 Personal history of transient ischemic attack (TIA), and cerebral infarction without residual deficits: Secondary | ICD-10-CM | POA: Diagnosis not present

## 2018-06-01 DIAGNOSIS — E1165 Type 2 diabetes mellitus with hyperglycemia: Secondary | ICD-10-CM | POA: Diagnosis not present

## 2018-06-01 DIAGNOSIS — R2689 Other abnormalities of gait and mobility: Secondary | ICD-10-CM | POA: Diagnosis not present

## 2018-06-01 DIAGNOSIS — Z87440 Personal history of urinary (tract) infections: Secondary | ICD-10-CM | POA: Diagnosis not present

## 2018-06-01 DIAGNOSIS — M6281 Muscle weakness (generalized): Secondary | ICD-10-CM | POA: Diagnosis not present

## 2018-06-01 DIAGNOSIS — I10 Essential (primary) hypertension: Secondary | ICD-10-CM | POA: Diagnosis not present

## 2018-06-02 DIAGNOSIS — I10 Essential (primary) hypertension: Secondary | ICD-10-CM | POA: Diagnosis not present

## 2018-06-02 DIAGNOSIS — E1165 Type 2 diabetes mellitus with hyperglycemia: Secondary | ICD-10-CM | POA: Diagnosis not present

## 2018-06-02 DIAGNOSIS — M6281 Muscle weakness (generalized): Secondary | ICD-10-CM | POA: Diagnosis not present

## 2018-06-02 DIAGNOSIS — R2689 Other abnormalities of gait and mobility: Secondary | ICD-10-CM | POA: Diagnosis not present

## 2018-06-02 DIAGNOSIS — Z87440 Personal history of urinary (tract) infections: Secondary | ICD-10-CM | POA: Diagnosis not present

## 2018-06-02 DIAGNOSIS — Z8673 Personal history of transient ischemic attack (TIA), and cerebral infarction without residual deficits: Secondary | ICD-10-CM | POA: Diagnosis not present

## 2018-06-03 DIAGNOSIS — E1142 Type 2 diabetes mellitus with diabetic polyneuropathy: Secondary | ICD-10-CM | POA: Diagnosis not present

## 2018-06-03 DIAGNOSIS — M6281 Muscle weakness (generalized): Secondary | ICD-10-CM | POA: Diagnosis not present

## 2018-06-03 DIAGNOSIS — Z299 Encounter for prophylactic measures, unspecified: Secondary | ICD-10-CM | POA: Diagnosis not present

## 2018-06-03 DIAGNOSIS — Z8673 Personal history of transient ischemic attack (TIA), and cerebral infarction without residual deficits: Secondary | ICD-10-CM | POA: Diagnosis not present

## 2018-06-03 DIAGNOSIS — R2689 Other abnormalities of gait and mobility: Secondary | ICD-10-CM | POA: Diagnosis not present

## 2018-06-03 DIAGNOSIS — E1165 Type 2 diabetes mellitus with hyperglycemia: Secondary | ICD-10-CM | POA: Diagnosis not present

## 2018-06-03 DIAGNOSIS — Z87440 Personal history of urinary (tract) infections: Secondary | ICD-10-CM | POA: Diagnosis not present

## 2018-06-03 DIAGNOSIS — I10 Essential (primary) hypertension: Secondary | ICD-10-CM | POA: Diagnosis not present

## 2018-06-04 DIAGNOSIS — R350 Frequency of micturition: Secondary | ICD-10-CM | POA: Diagnosis not present

## 2018-06-08 DIAGNOSIS — Z8673 Personal history of transient ischemic attack (TIA), and cerebral infarction without residual deficits: Secondary | ICD-10-CM | POA: Diagnosis not present

## 2018-06-08 DIAGNOSIS — I10 Essential (primary) hypertension: Secondary | ICD-10-CM | POA: Diagnosis not present

## 2018-06-08 DIAGNOSIS — Z87440 Personal history of urinary (tract) infections: Secondary | ICD-10-CM | POA: Diagnosis not present

## 2018-06-08 DIAGNOSIS — Z6829 Body mass index (BMI) 29.0-29.9, adult: Secondary | ICD-10-CM | POA: Diagnosis not present

## 2018-06-08 DIAGNOSIS — R2689 Other abnormalities of gait and mobility: Secondary | ICD-10-CM | POA: Diagnosis not present

## 2018-06-08 DIAGNOSIS — F321 Major depressive disorder, single episode, moderate: Secondary | ICD-10-CM | POA: Diagnosis not present

## 2018-06-08 DIAGNOSIS — Z299 Encounter for prophylactic measures, unspecified: Secondary | ICD-10-CM | POA: Diagnosis not present

## 2018-06-08 DIAGNOSIS — N390 Urinary tract infection, site not specified: Secondary | ICD-10-CM | POA: Diagnosis not present

## 2018-06-08 DIAGNOSIS — E1142 Type 2 diabetes mellitus with diabetic polyneuropathy: Secondary | ICD-10-CM | POA: Diagnosis not present

## 2018-06-08 DIAGNOSIS — E1165 Type 2 diabetes mellitus with hyperglycemia: Secondary | ICD-10-CM | POA: Diagnosis not present

## 2018-06-08 DIAGNOSIS — M6281 Muscle weakness (generalized): Secondary | ICD-10-CM | POA: Diagnosis not present

## 2018-06-10 DIAGNOSIS — I2699 Other pulmonary embolism without acute cor pulmonale: Secondary | ICD-10-CM | POA: Diagnosis not present

## 2018-06-10 DIAGNOSIS — Z8673 Personal history of transient ischemic attack (TIA), and cerebral infarction without residual deficits: Secondary | ICD-10-CM | POA: Diagnosis not present

## 2018-06-10 DIAGNOSIS — Z299 Encounter for prophylactic measures, unspecified: Secondary | ICD-10-CM | POA: Diagnosis not present

## 2018-06-10 DIAGNOSIS — R2689 Other abnormalities of gait and mobility: Secondary | ICD-10-CM | POA: Diagnosis not present

## 2018-06-10 DIAGNOSIS — Z87440 Personal history of urinary (tract) infections: Secondary | ICD-10-CM | POA: Diagnosis not present

## 2018-06-10 DIAGNOSIS — M6281 Muscle weakness (generalized): Secondary | ICD-10-CM | POA: Diagnosis not present

## 2018-06-10 DIAGNOSIS — E1165 Type 2 diabetes mellitus with hyperglycemia: Secondary | ICD-10-CM | POA: Diagnosis not present

## 2018-06-10 DIAGNOSIS — E1142 Type 2 diabetes mellitus with diabetic polyneuropathy: Secondary | ICD-10-CM | POA: Diagnosis not present

## 2018-06-10 DIAGNOSIS — F321 Major depressive disorder, single episode, moderate: Secondary | ICD-10-CM | POA: Diagnosis not present

## 2018-06-10 DIAGNOSIS — I10 Essential (primary) hypertension: Secondary | ICD-10-CM | POA: Diagnosis not present

## 2018-06-11 DIAGNOSIS — M6281 Muscle weakness (generalized): Secondary | ICD-10-CM | POA: Diagnosis not present

## 2018-06-11 DIAGNOSIS — Z8673 Personal history of transient ischemic attack (TIA), and cerebral infarction without residual deficits: Secondary | ICD-10-CM | POA: Diagnosis not present

## 2018-06-11 DIAGNOSIS — R2689 Other abnormalities of gait and mobility: Secondary | ICD-10-CM | POA: Diagnosis not present

## 2018-06-11 DIAGNOSIS — Z87440 Personal history of urinary (tract) infections: Secondary | ICD-10-CM | POA: Diagnosis not present

## 2018-06-11 DIAGNOSIS — I10 Essential (primary) hypertension: Secondary | ICD-10-CM | POA: Diagnosis not present

## 2018-06-11 DIAGNOSIS — E1165 Type 2 diabetes mellitus with hyperglycemia: Secondary | ICD-10-CM | POA: Diagnosis not present

## 2018-06-14 DIAGNOSIS — R2689 Other abnormalities of gait and mobility: Secondary | ICD-10-CM | POA: Diagnosis not present

## 2018-06-14 DIAGNOSIS — E1165 Type 2 diabetes mellitus with hyperglycemia: Secondary | ICD-10-CM | POA: Diagnosis not present

## 2018-06-14 DIAGNOSIS — Z8673 Personal history of transient ischemic attack (TIA), and cerebral infarction without residual deficits: Secondary | ICD-10-CM | POA: Diagnosis not present

## 2018-06-17 DIAGNOSIS — Z87440 Personal history of urinary (tract) infections: Secondary | ICD-10-CM | POA: Diagnosis not present

## 2018-06-17 DIAGNOSIS — E1142 Type 2 diabetes mellitus with diabetic polyneuropathy: Secondary | ICD-10-CM | POA: Diagnosis not present

## 2018-06-17 DIAGNOSIS — I10 Essential (primary) hypertension: Secondary | ICD-10-CM | POA: Diagnosis not present

## 2018-06-17 DIAGNOSIS — R2689 Other abnormalities of gait and mobility: Secondary | ICD-10-CM | POA: Diagnosis not present

## 2018-06-17 DIAGNOSIS — Z8673 Personal history of transient ischemic attack (TIA), and cerebral infarction without residual deficits: Secondary | ICD-10-CM | POA: Diagnosis not present

## 2018-06-17 DIAGNOSIS — E1165 Type 2 diabetes mellitus with hyperglycemia: Secondary | ICD-10-CM | POA: Diagnosis not present

## 2018-06-17 DIAGNOSIS — R296 Repeated falls: Secondary | ICD-10-CM | POA: Diagnosis not present

## 2018-06-17 DIAGNOSIS — Z299 Encounter for prophylactic measures, unspecified: Secondary | ICD-10-CM | POA: Diagnosis not present

## 2018-06-17 DIAGNOSIS — M6281 Muscle weakness (generalized): Secondary | ICD-10-CM | POA: Diagnosis not present

## 2018-06-18 DIAGNOSIS — R410 Disorientation, unspecified: Secondary | ICD-10-CM | POA: Diagnosis not present

## 2018-06-18 DIAGNOSIS — R296 Repeated falls: Secondary | ICD-10-CM | POA: Diagnosis not present

## 2018-06-20 DIAGNOSIS — M6281 Muscle weakness (generalized): Secondary | ICD-10-CM | POA: Diagnosis not present

## 2018-06-20 DIAGNOSIS — Z794 Long term (current) use of insulin: Secondary | ICD-10-CM | POA: Diagnosis not present

## 2018-06-20 DIAGNOSIS — E1165 Type 2 diabetes mellitus with hyperglycemia: Secondary | ICD-10-CM | POA: Diagnosis not present

## 2018-06-20 DIAGNOSIS — Z7901 Long term (current) use of anticoagulants: Secondary | ICD-10-CM | POA: Diagnosis not present

## 2018-06-20 DIAGNOSIS — Z8673 Personal history of transient ischemic attack (TIA), and cerebral infarction without residual deficits: Secondary | ICD-10-CM | POA: Diagnosis not present

## 2018-06-20 DIAGNOSIS — Z87440 Personal history of urinary (tract) infections: Secondary | ICD-10-CM | POA: Diagnosis not present

## 2018-06-20 DIAGNOSIS — I10 Essential (primary) hypertension: Secondary | ICD-10-CM | POA: Diagnosis not present

## 2018-06-20 DIAGNOSIS — R2689 Other abnormalities of gait and mobility: Secondary | ICD-10-CM | POA: Diagnosis not present

## 2018-06-20 DIAGNOSIS — Z9181 History of falling: Secondary | ICD-10-CM | POA: Diagnosis not present

## 2018-06-23 DIAGNOSIS — I10 Essential (primary) hypertension: Secondary | ICD-10-CM | POA: Diagnosis not present

## 2018-06-23 DIAGNOSIS — M6281 Muscle weakness (generalized): Secondary | ICD-10-CM | POA: Diagnosis not present

## 2018-06-23 DIAGNOSIS — R2689 Other abnormalities of gait and mobility: Secondary | ICD-10-CM | POA: Diagnosis not present

## 2018-06-23 DIAGNOSIS — Z87440 Personal history of urinary (tract) infections: Secondary | ICD-10-CM | POA: Diagnosis not present

## 2018-06-23 DIAGNOSIS — E1165 Type 2 diabetes mellitus with hyperglycemia: Secondary | ICD-10-CM | POA: Diagnosis not present

## 2018-06-23 DIAGNOSIS — Z8673 Personal history of transient ischemic attack (TIA), and cerebral infarction without residual deficits: Secondary | ICD-10-CM | POA: Diagnosis not present

## 2018-06-24 DIAGNOSIS — N390 Urinary tract infection, site not specified: Secondary | ICD-10-CM | POA: Diagnosis not present

## 2018-06-24 DIAGNOSIS — I10 Essential (primary) hypertension: Secondary | ICD-10-CM | POA: Diagnosis not present

## 2018-06-24 DIAGNOSIS — E1165 Type 2 diabetes mellitus with hyperglycemia: Secondary | ICD-10-CM | POA: Diagnosis not present

## 2018-06-24 DIAGNOSIS — E1142 Type 2 diabetes mellitus with diabetic polyneuropathy: Secondary | ICD-10-CM | POA: Diagnosis not present

## 2018-06-24 DIAGNOSIS — Z299 Encounter for prophylactic measures, unspecified: Secondary | ICD-10-CM | POA: Diagnosis not present

## 2018-07-01 DIAGNOSIS — I10 Essential (primary) hypertension: Secondary | ICD-10-CM | POA: Diagnosis not present

## 2018-07-01 DIAGNOSIS — Z299 Encounter for prophylactic measures, unspecified: Secondary | ICD-10-CM | POA: Diagnosis not present

## 2018-07-01 DIAGNOSIS — M1990 Unspecified osteoarthritis, unspecified site: Secondary | ICD-10-CM | POA: Diagnosis not present

## 2018-07-03 DIAGNOSIS — R2689 Other abnormalities of gait and mobility: Secondary | ICD-10-CM | POA: Diagnosis not present

## 2018-07-03 DIAGNOSIS — M6281 Muscle weakness (generalized): Secondary | ICD-10-CM | POA: Diagnosis not present

## 2018-07-03 DIAGNOSIS — Z8673 Personal history of transient ischemic attack (TIA), and cerebral infarction without residual deficits: Secondary | ICD-10-CM | POA: Diagnosis not present

## 2018-07-03 DIAGNOSIS — E1165 Type 2 diabetes mellitus with hyperglycemia: Secondary | ICD-10-CM | POA: Diagnosis not present

## 2018-07-03 DIAGNOSIS — I10 Essential (primary) hypertension: Secondary | ICD-10-CM | POA: Diagnosis not present

## 2018-07-03 DIAGNOSIS — Z87440 Personal history of urinary (tract) infections: Secondary | ICD-10-CM | POA: Diagnosis not present

## 2018-07-07 DIAGNOSIS — R2689 Other abnormalities of gait and mobility: Secondary | ICD-10-CM | POA: Diagnosis not present

## 2018-07-07 DIAGNOSIS — E1165 Type 2 diabetes mellitus with hyperglycemia: Secondary | ICD-10-CM | POA: Diagnosis not present

## 2018-07-07 DIAGNOSIS — Z87440 Personal history of urinary (tract) infections: Secondary | ICD-10-CM | POA: Diagnosis not present

## 2018-07-07 DIAGNOSIS — M6281 Muscle weakness (generalized): Secondary | ICD-10-CM | POA: Diagnosis not present

## 2018-07-07 DIAGNOSIS — I10 Essential (primary) hypertension: Secondary | ICD-10-CM | POA: Diagnosis not present

## 2018-07-07 DIAGNOSIS — Z8673 Personal history of transient ischemic attack (TIA), and cerebral infarction without residual deficits: Secondary | ICD-10-CM | POA: Diagnosis not present

## 2018-07-08 DIAGNOSIS — J189 Pneumonia, unspecified organism: Secondary | ICD-10-CM | POA: Diagnosis not present

## 2018-07-08 DIAGNOSIS — J181 Lobar pneumonia, unspecified organism: Secondary | ICD-10-CM | POA: Diagnosis not present

## 2018-07-08 DIAGNOSIS — I10 Essential (primary) hypertension: Secondary | ICD-10-CM | POA: Diagnosis not present

## 2018-07-08 DIAGNOSIS — B961 Klebsiella pneumoniae [K. pneumoniae] as the cause of diseases classified elsewhere: Secondary | ICD-10-CM | POA: Diagnosis not present

## 2018-07-08 DIAGNOSIS — E1165 Type 2 diabetes mellitus with hyperglycemia: Secondary | ICD-10-CM | POA: Diagnosis not present

## 2018-07-08 DIAGNOSIS — Z713 Dietary counseling and surveillance: Secondary | ICD-10-CM | POA: Diagnosis not present

## 2018-07-08 DIAGNOSIS — N390 Urinary tract infection, site not specified: Secondary | ICD-10-CM | POA: Diagnosis not present

## 2018-07-08 DIAGNOSIS — F0390 Unspecified dementia without behavioral disturbance: Secondary | ICD-10-CM | POA: Diagnosis not present

## 2018-07-08 DIAGNOSIS — R300 Dysuria: Secondary | ICD-10-CM | POA: Diagnosis not present

## 2018-07-08 DIAGNOSIS — J180 Bronchopneumonia, unspecified organism: Secondary | ICD-10-CM | POA: Diagnosis not present

## 2018-07-08 DIAGNOSIS — Z299 Encounter for prophylactic measures, unspecified: Secondary | ICD-10-CM | POA: Diagnosis not present

## 2018-07-08 DIAGNOSIS — W19XXXA Unspecified fall, initial encounter: Secondary | ICD-10-CM | POA: Diagnosis not present

## 2018-07-08 DIAGNOSIS — Z1159 Encounter for screening for other viral diseases: Secondary | ICD-10-CM | POA: Diagnosis not present

## 2018-07-14 DIAGNOSIS — E1165 Type 2 diabetes mellitus with hyperglycemia: Secondary | ICD-10-CM | POA: Diagnosis not present

## 2018-07-14 DIAGNOSIS — I10 Essential (primary) hypertension: Secondary | ICD-10-CM | POA: Diagnosis not present

## 2018-07-14 DIAGNOSIS — Z87440 Personal history of urinary (tract) infections: Secondary | ICD-10-CM | POA: Diagnosis not present

## 2018-07-14 DIAGNOSIS — R2689 Other abnormalities of gait and mobility: Secondary | ICD-10-CM | POA: Diagnosis not present

## 2018-07-14 DIAGNOSIS — Z8673 Personal history of transient ischemic attack (TIA), and cerebral infarction without residual deficits: Secondary | ICD-10-CM | POA: Diagnosis not present

## 2018-07-14 DIAGNOSIS — M6281 Muscle weakness (generalized): Secondary | ICD-10-CM | POA: Diagnosis not present

## 2018-07-15 DIAGNOSIS — Z713 Dietary counseling and surveillance: Secondary | ICD-10-CM | POA: Diagnosis not present

## 2018-07-15 DIAGNOSIS — N390 Urinary tract infection, site not specified: Secondary | ICD-10-CM | POA: Diagnosis not present

## 2018-07-15 DIAGNOSIS — Z299 Encounter for prophylactic measures, unspecified: Secondary | ICD-10-CM | POA: Diagnosis not present

## 2018-07-15 DIAGNOSIS — I10 Essential (primary) hypertension: Secondary | ICD-10-CM | POA: Diagnosis not present

## 2018-07-22 DIAGNOSIS — Z299 Encounter for prophylactic measures, unspecified: Secondary | ICD-10-CM | POA: Diagnosis not present

## 2018-07-22 DIAGNOSIS — E1165 Type 2 diabetes mellitus with hyperglycemia: Secondary | ICD-10-CM | POA: Diagnosis not present

## 2018-07-22 DIAGNOSIS — I10 Essential (primary) hypertension: Secondary | ICD-10-CM | POA: Diagnosis not present

## 2018-07-22 DIAGNOSIS — E1142 Type 2 diabetes mellitus with diabetic polyneuropathy: Secondary | ICD-10-CM | POA: Diagnosis not present

## 2018-07-22 DIAGNOSIS — Z6829 Body mass index (BMI) 29.0-29.9, adult: Secondary | ICD-10-CM | POA: Diagnosis not present

## 2018-07-29 DIAGNOSIS — Z6828 Body mass index (BMI) 28.0-28.9, adult: Secondary | ICD-10-CM | POA: Diagnosis not present

## 2018-07-29 DIAGNOSIS — E1165 Type 2 diabetes mellitus with hyperglycemia: Secondary | ICD-10-CM | POA: Diagnosis not present

## 2018-07-29 DIAGNOSIS — E1142 Type 2 diabetes mellitus with diabetic polyneuropathy: Secondary | ICD-10-CM | POA: Diagnosis not present

## 2018-07-29 DIAGNOSIS — I10 Essential (primary) hypertension: Secondary | ICD-10-CM | POA: Diagnosis not present

## 2018-07-29 DIAGNOSIS — Z299 Encounter for prophylactic measures, unspecified: Secondary | ICD-10-CM | POA: Diagnosis not present

## 2018-08-05 DIAGNOSIS — Z299 Encounter for prophylactic measures, unspecified: Secondary | ICD-10-CM | POA: Diagnosis not present

## 2018-08-05 DIAGNOSIS — B370 Candidal stomatitis: Secondary | ICD-10-CM | POA: Diagnosis not present

## 2018-08-05 DIAGNOSIS — I10 Essential (primary) hypertension: Secondary | ICD-10-CM | POA: Diagnosis not present

## 2018-08-05 DIAGNOSIS — Z713 Dietary counseling and surveillance: Secondary | ICD-10-CM | POA: Diagnosis not present

## 2018-08-05 DIAGNOSIS — Z6828 Body mass index (BMI) 28.0-28.9, adult: Secondary | ICD-10-CM | POA: Diagnosis not present

## 2018-08-26 DIAGNOSIS — Z6828 Body mass index (BMI) 28.0-28.9, adult: Secondary | ICD-10-CM | POA: Diagnosis not present

## 2018-08-26 DIAGNOSIS — Z299 Encounter for prophylactic measures, unspecified: Secondary | ICD-10-CM | POA: Diagnosis not present

## 2018-08-26 DIAGNOSIS — K5900 Constipation, unspecified: Secondary | ICD-10-CM | POA: Diagnosis not present

## 2018-08-26 DIAGNOSIS — I10 Essential (primary) hypertension: Secondary | ICD-10-CM | POA: Diagnosis not present

## 2018-08-26 DIAGNOSIS — E1165 Type 2 diabetes mellitus with hyperglycemia: Secondary | ICD-10-CM | POA: Diagnosis not present

## 2018-09-02 DIAGNOSIS — I10 Essential (primary) hypertension: Secondary | ICD-10-CM | POA: Diagnosis not present

## 2018-09-02 DIAGNOSIS — E1165 Type 2 diabetes mellitus with hyperglycemia: Secondary | ICD-10-CM | POA: Diagnosis not present

## 2018-09-02 DIAGNOSIS — Z6828 Body mass index (BMI) 28.0-28.9, adult: Secondary | ICD-10-CM | POA: Diagnosis not present

## 2018-09-02 DIAGNOSIS — F329 Major depressive disorder, single episode, unspecified: Secondary | ICD-10-CM | POA: Diagnosis not present

## 2018-09-02 DIAGNOSIS — E1142 Type 2 diabetes mellitus with diabetic polyneuropathy: Secondary | ICD-10-CM | POA: Diagnosis not present

## 2018-09-02 DIAGNOSIS — Z299 Encounter for prophylactic measures, unspecified: Secondary | ICD-10-CM | POA: Diagnosis not present

## 2018-09-07 DIAGNOSIS — J180 Bronchopneumonia, unspecified organism: Secondary | ICD-10-CM | POA: Diagnosis not present

## 2018-09-07 DIAGNOSIS — F339 Major depressive disorder, recurrent, unspecified: Secondary | ICD-10-CM | POA: Diagnosis not present

## 2018-09-07 DIAGNOSIS — I700 Atherosclerosis of aorta: Secondary | ICD-10-CM | POA: Diagnosis present

## 2018-09-07 DIAGNOSIS — R410 Disorientation, unspecified: Secondary | ICD-10-CM | POA: Diagnosis not present

## 2018-09-07 DIAGNOSIS — J181 Lobar pneumonia, unspecified organism: Secondary | ICD-10-CM | POA: Diagnosis not present

## 2018-09-07 DIAGNOSIS — R609 Edema, unspecified: Secondary | ICD-10-CM | POA: Diagnosis not present

## 2018-09-07 DIAGNOSIS — G92 Toxic encephalopathy: Secondary | ICD-10-CM | POA: Diagnosis not present

## 2018-09-07 DIAGNOSIS — Z8673 Personal history of transient ischemic attack (TIA), and cerebral infarction without residual deficits: Secondary | ICD-10-CM | POA: Diagnosis not present

## 2018-09-07 DIAGNOSIS — Z794 Long term (current) use of insulin: Secondary | ICD-10-CM | POA: Diagnosis not present

## 2018-09-07 DIAGNOSIS — B961 Klebsiella pneumoniae [K. pneumoniae] as the cause of diseases classified elsewhere: Secondary | ICD-10-CM | POA: Diagnosis not present

## 2018-09-07 DIAGNOSIS — Z7901 Long term (current) use of anticoagulants: Secondary | ICD-10-CM | POA: Diagnosis not present

## 2018-09-07 DIAGNOSIS — E1165 Type 2 diabetes mellitus with hyperglycemia: Secondary | ICD-10-CM | POA: Diagnosis not present

## 2018-09-07 DIAGNOSIS — N390 Urinary tract infection, site not specified: Secondary | ICD-10-CM | POA: Diagnosis not present

## 2018-09-07 DIAGNOSIS — Z1159 Encounter for screening for other viral diseases: Secondary | ICD-10-CM | POA: Diagnosis not present

## 2018-09-07 DIAGNOSIS — I10 Essential (primary) hypertension: Secondary | ICD-10-CM | POA: Diagnosis not present

## 2018-09-07 DIAGNOSIS — R41841 Cognitive communication deficit: Secondary | ICD-10-CM | POA: Diagnosis not present

## 2018-09-07 DIAGNOSIS — F0390 Unspecified dementia without behavioral disturbance: Secondary | ICD-10-CM | POA: Diagnosis not present

## 2018-09-07 DIAGNOSIS — M6281 Muscle weakness (generalized): Secondary | ICD-10-CM | POA: Diagnosis not present

## 2018-09-07 DIAGNOSIS — Z86711 Personal history of pulmonary embolism: Secondary | ICD-10-CM | POA: Diagnosis not present

## 2018-09-07 DIAGNOSIS — J189 Pneumonia, unspecified organism: Secondary | ICD-10-CM | POA: Diagnosis not present

## 2018-09-07 DIAGNOSIS — R2689 Other abnormalities of gait and mobility: Secondary | ICD-10-CM | POA: Diagnosis not present

## 2018-09-15 DIAGNOSIS — I10 Essential (primary) hypertension: Secondary | ICD-10-CM | POA: Diagnosis not present

## 2018-09-15 DIAGNOSIS — F0390 Unspecified dementia without behavioral disturbance: Secondary | ICD-10-CM | POA: Diagnosis not present

## 2018-09-15 DIAGNOSIS — E119 Type 2 diabetes mellitus without complications: Secondary | ICD-10-CM | POA: Diagnosis not present

## 2018-09-15 DIAGNOSIS — J180 Bronchopneumonia, unspecified organism: Secondary | ICD-10-CM | POA: Diagnosis not present

## 2018-09-15 DIAGNOSIS — J189 Pneumonia, unspecified organism: Secondary | ICD-10-CM | POA: Diagnosis not present

## 2018-09-15 DIAGNOSIS — J181 Lobar pneumonia, unspecified organism: Secondary | ICD-10-CM | POA: Diagnosis not present

## 2018-09-15 DIAGNOSIS — G92 Toxic encephalopathy: Secondary | ICD-10-CM | POA: Diagnosis not present

## 2018-09-15 DIAGNOSIS — M6281 Muscle weakness (generalized): Secondary | ICD-10-CM | POA: Diagnosis not present

## 2018-09-15 DIAGNOSIS — N390 Urinary tract infection, site not specified: Secondary | ICD-10-CM | POA: Diagnosis not present

## 2018-09-15 DIAGNOSIS — Z8673 Personal history of transient ischemic attack (TIA), and cerebral infarction without residual deficits: Secondary | ICD-10-CM | POA: Diagnosis not present

## 2018-09-15 DIAGNOSIS — R41841 Cognitive communication deficit: Secondary | ICD-10-CM | POA: Diagnosis not present

## 2018-09-15 DIAGNOSIS — F339 Major depressive disorder, recurrent, unspecified: Secondary | ICD-10-CM | POA: Diagnosis not present

## 2018-09-15 DIAGNOSIS — Z03818 Encounter for observation for suspected exposure to other biological agents ruled out: Secondary | ICD-10-CM | POA: Diagnosis not present

## 2018-09-15 DIAGNOSIS — B961 Klebsiella pneumoniae [K. pneumoniae] as the cause of diseases classified elsewhere: Secondary | ICD-10-CM | POA: Diagnosis not present

## 2018-09-15 DIAGNOSIS — E1165 Type 2 diabetes mellitus with hyperglycemia: Secondary | ICD-10-CM | POA: Diagnosis not present

## 2018-09-15 DIAGNOSIS — Z86711 Personal history of pulmonary embolism: Secondary | ICD-10-CM | POA: Diagnosis not present

## 2018-09-15 DIAGNOSIS — R2689 Other abnormalities of gait and mobility: Secondary | ICD-10-CM | POA: Diagnosis not present

## 2018-09-16 DIAGNOSIS — J189 Pneumonia, unspecified organism: Secondary | ICD-10-CM | POA: Diagnosis not present

## 2018-09-16 DIAGNOSIS — I10 Essential (primary) hypertension: Secondary | ICD-10-CM | POA: Diagnosis not present

## 2018-09-16 DIAGNOSIS — N390 Urinary tract infection, site not specified: Secondary | ICD-10-CM | POA: Diagnosis not present

## 2018-09-16 DIAGNOSIS — E119 Type 2 diabetes mellitus without complications: Secondary | ICD-10-CM | POA: Diagnosis not present

## 2018-09-29 DIAGNOSIS — J189 Pneumonia, unspecified organism: Secondary | ICD-10-CM | POA: Diagnosis not present

## 2018-09-29 DIAGNOSIS — E119 Type 2 diabetes mellitus without complications: Secondary | ICD-10-CM | POA: Diagnosis not present

## 2018-09-29 DIAGNOSIS — N390 Urinary tract infection, site not specified: Secondary | ICD-10-CM | POA: Diagnosis not present

## 2018-09-30 DIAGNOSIS — I10 Essential (primary) hypertension: Secondary | ICD-10-CM | POA: Diagnosis not present

## 2018-09-30 DIAGNOSIS — B370 Candidal stomatitis: Secondary | ICD-10-CM | POA: Diagnosis not present

## 2018-09-30 DIAGNOSIS — Z299 Encounter for prophylactic measures, unspecified: Secondary | ICD-10-CM | POA: Diagnosis not present

## 2018-09-30 DIAGNOSIS — Z6828 Body mass index (BMI) 28.0-28.9, adult: Secondary | ICD-10-CM | POA: Diagnosis not present

## 2018-09-30 DIAGNOSIS — Z789 Other specified health status: Secondary | ICD-10-CM | POA: Diagnosis not present

## 2018-10-01 ENCOUNTER — Other Ambulatory Visit: Payer: Self-pay | Admitting: *Deleted

## 2018-10-01 NOTE — Patient Outreach (Signed)
Member screened for potential [facility] Care Management needs as a benefit of her [employer].  Spoke with [facility] UM RN who confirms [name] returned to [place].   Writer to sign off. No identifiable [facility] Care Management needs at this time.   Marthenia Rolling, MSN-Ed, RN,BSN [place] Acute Care Coordinator [phone]

## 2018-10-02 DIAGNOSIS — B961 Klebsiella pneumoniae [K. pneumoniae] as the cause of diseases classified elsewhere: Secondary | ICD-10-CM | POA: Diagnosis not present

## 2018-10-02 DIAGNOSIS — N390 Urinary tract infection, site not specified: Secondary | ICD-10-CM | POA: Diagnosis not present

## 2018-10-02 DIAGNOSIS — J181 Lobar pneumonia, unspecified organism: Secondary | ICD-10-CM | POA: Diagnosis not present

## 2018-10-02 DIAGNOSIS — E1165 Type 2 diabetes mellitus with hyperglycemia: Secondary | ICD-10-CM | POA: Diagnosis not present

## 2018-10-02 DIAGNOSIS — Z794 Long term (current) use of insulin: Secondary | ICD-10-CM | POA: Diagnosis not present

## 2018-10-02 DIAGNOSIS — M6281 Muscle weakness (generalized): Secondary | ICD-10-CM | POA: Diagnosis not present

## 2018-10-05 DIAGNOSIS — E1165 Type 2 diabetes mellitus with hyperglycemia: Secondary | ICD-10-CM | POA: Diagnosis not present

## 2018-10-05 DIAGNOSIS — Z794 Long term (current) use of insulin: Secondary | ICD-10-CM | POA: Diagnosis not present

## 2018-10-05 DIAGNOSIS — B961 Klebsiella pneumoniae [K. pneumoniae] as the cause of diseases classified elsewhere: Secondary | ICD-10-CM | POA: Diagnosis not present

## 2018-10-05 DIAGNOSIS — M6281 Muscle weakness (generalized): Secondary | ICD-10-CM | POA: Diagnosis not present

## 2018-10-05 DIAGNOSIS — J181 Lobar pneumonia, unspecified organism: Secondary | ICD-10-CM | POA: Diagnosis not present

## 2018-10-05 DIAGNOSIS — N390 Urinary tract infection, site not specified: Secondary | ICD-10-CM | POA: Diagnosis not present

## 2018-10-07 DIAGNOSIS — N390 Urinary tract infection, site not specified: Secondary | ICD-10-CM | POA: Diagnosis not present

## 2018-10-07 DIAGNOSIS — J181 Lobar pneumonia, unspecified organism: Secondary | ICD-10-CM | POA: Diagnosis not present

## 2018-10-07 DIAGNOSIS — M6281 Muscle weakness (generalized): Secondary | ICD-10-CM | POA: Diagnosis not present

## 2018-10-07 DIAGNOSIS — B961 Klebsiella pneumoniae [K. pneumoniae] as the cause of diseases classified elsewhere: Secondary | ICD-10-CM | POA: Diagnosis not present

## 2018-10-07 DIAGNOSIS — Z794 Long term (current) use of insulin: Secondary | ICD-10-CM | POA: Diagnosis not present

## 2018-10-07 DIAGNOSIS — E1165 Type 2 diabetes mellitus with hyperglycemia: Secondary | ICD-10-CM | POA: Diagnosis not present

## 2018-10-08 DIAGNOSIS — E1165 Type 2 diabetes mellitus with hyperglycemia: Secondary | ICD-10-CM | POA: Diagnosis not present

## 2018-10-08 DIAGNOSIS — J181 Lobar pneumonia, unspecified organism: Secondary | ICD-10-CM | POA: Diagnosis not present

## 2018-10-08 DIAGNOSIS — B961 Klebsiella pneumoniae [K. pneumoniae] as the cause of diseases classified elsewhere: Secondary | ICD-10-CM | POA: Diagnosis not present

## 2018-10-08 DIAGNOSIS — M6281 Muscle weakness (generalized): Secondary | ICD-10-CM | POA: Diagnosis not present

## 2018-10-08 DIAGNOSIS — N390 Urinary tract infection, site not specified: Secondary | ICD-10-CM | POA: Diagnosis not present

## 2018-10-08 DIAGNOSIS — Z794 Long term (current) use of insulin: Secondary | ICD-10-CM | POA: Diagnosis not present

## 2018-10-09 DIAGNOSIS — J181 Lobar pneumonia, unspecified organism: Secondary | ICD-10-CM | POA: Diagnosis not present

## 2018-10-09 DIAGNOSIS — M6281 Muscle weakness (generalized): Secondary | ICD-10-CM | POA: Diagnosis not present

## 2018-10-09 DIAGNOSIS — Z794 Long term (current) use of insulin: Secondary | ICD-10-CM | POA: Diagnosis not present

## 2018-10-09 DIAGNOSIS — N390 Urinary tract infection, site not specified: Secondary | ICD-10-CM | POA: Diagnosis not present

## 2018-10-09 DIAGNOSIS — E1165 Type 2 diabetes mellitus with hyperglycemia: Secondary | ICD-10-CM | POA: Diagnosis not present

## 2018-10-09 DIAGNOSIS — B961 Klebsiella pneumoniae [K. pneumoniae] as the cause of diseases classified elsewhere: Secondary | ICD-10-CM | POA: Diagnosis not present

## 2018-10-12 DIAGNOSIS — M6281 Muscle weakness (generalized): Secondary | ICD-10-CM | POA: Diagnosis not present

## 2018-10-12 DIAGNOSIS — Z794 Long term (current) use of insulin: Secondary | ICD-10-CM | POA: Diagnosis not present

## 2018-10-12 DIAGNOSIS — J181 Lobar pneumonia, unspecified organism: Secondary | ICD-10-CM | POA: Diagnosis not present

## 2018-10-12 DIAGNOSIS — B961 Klebsiella pneumoniae [K. pneumoniae] as the cause of diseases classified elsewhere: Secondary | ICD-10-CM | POA: Diagnosis not present

## 2018-10-12 DIAGNOSIS — E1165 Type 2 diabetes mellitus with hyperglycemia: Secondary | ICD-10-CM | POA: Diagnosis not present

## 2018-10-12 DIAGNOSIS — N390 Urinary tract infection, site not specified: Secondary | ICD-10-CM | POA: Diagnosis not present

## 2018-10-14 DIAGNOSIS — M6281 Muscle weakness (generalized): Secondary | ICD-10-CM | POA: Diagnosis not present

## 2018-10-14 DIAGNOSIS — Z794 Long term (current) use of insulin: Secondary | ICD-10-CM | POA: Diagnosis not present

## 2018-10-14 DIAGNOSIS — N390 Urinary tract infection, site not specified: Secondary | ICD-10-CM | POA: Diagnosis not present

## 2018-10-14 DIAGNOSIS — K219 Gastro-esophageal reflux disease without esophagitis: Secondary | ICD-10-CM | POA: Diagnosis not present

## 2018-10-14 DIAGNOSIS — R350 Frequency of micturition: Secondary | ICD-10-CM | POA: Diagnosis not present

## 2018-10-14 DIAGNOSIS — I10 Essential (primary) hypertension: Secondary | ICD-10-CM | POA: Diagnosis not present

## 2018-10-14 DIAGNOSIS — J181 Lobar pneumonia, unspecified organism: Secondary | ICD-10-CM | POA: Diagnosis not present

## 2018-10-14 DIAGNOSIS — Z6828 Body mass index (BMI) 28.0-28.9, adult: Secondary | ICD-10-CM | POA: Diagnosis not present

## 2018-10-14 DIAGNOSIS — Z299 Encounter for prophylactic measures, unspecified: Secondary | ICD-10-CM | POA: Diagnosis not present

## 2018-10-14 DIAGNOSIS — E1165 Type 2 diabetes mellitus with hyperglycemia: Secondary | ICD-10-CM | POA: Diagnosis not present

## 2018-10-14 DIAGNOSIS — M1990 Unspecified osteoarthritis, unspecified site: Secondary | ICD-10-CM | POA: Diagnosis not present

## 2018-10-14 DIAGNOSIS — B961 Klebsiella pneumoniae [K. pneumoniae] as the cause of diseases classified elsewhere: Secondary | ICD-10-CM | POA: Diagnosis not present

## 2018-10-15 DIAGNOSIS — J181 Lobar pneumonia, unspecified organism: Secondary | ICD-10-CM | POA: Diagnosis not present

## 2018-10-15 DIAGNOSIS — Z794 Long term (current) use of insulin: Secondary | ICD-10-CM | POA: Diagnosis not present

## 2018-10-15 DIAGNOSIS — N390 Urinary tract infection, site not specified: Secondary | ICD-10-CM | POA: Diagnosis not present

## 2018-10-15 DIAGNOSIS — M6281 Muscle weakness (generalized): Secondary | ICD-10-CM | POA: Diagnosis not present

## 2018-10-15 DIAGNOSIS — E1165 Type 2 diabetes mellitus with hyperglycemia: Secondary | ICD-10-CM | POA: Diagnosis not present

## 2018-10-15 DIAGNOSIS — B961 Klebsiella pneumoniae [K. pneumoniae] as the cause of diseases classified elsewhere: Secondary | ICD-10-CM | POA: Diagnosis not present

## 2018-10-19 DIAGNOSIS — Z794 Long term (current) use of insulin: Secondary | ICD-10-CM | POA: Diagnosis not present

## 2018-10-19 DIAGNOSIS — M6281 Muscle weakness (generalized): Secondary | ICD-10-CM | POA: Diagnosis not present

## 2018-10-19 DIAGNOSIS — J181 Lobar pneumonia, unspecified organism: Secondary | ICD-10-CM | POA: Diagnosis not present

## 2018-10-19 DIAGNOSIS — N390 Urinary tract infection, site not specified: Secondary | ICD-10-CM | POA: Diagnosis not present

## 2018-10-19 DIAGNOSIS — E1165 Type 2 diabetes mellitus with hyperglycemia: Secondary | ICD-10-CM | POA: Diagnosis not present

## 2018-10-19 DIAGNOSIS — B961 Klebsiella pneumoniae [K. pneumoniae] as the cause of diseases classified elsewhere: Secondary | ICD-10-CM | POA: Diagnosis not present

## 2018-10-21 DIAGNOSIS — Z794 Long term (current) use of insulin: Secondary | ICD-10-CM | POA: Diagnosis not present

## 2018-10-21 DIAGNOSIS — B961 Klebsiella pneumoniae [K. pneumoniae] as the cause of diseases classified elsewhere: Secondary | ICD-10-CM | POA: Diagnosis not present

## 2018-10-21 DIAGNOSIS — E1165 Type 2 diabetes mellitus with hyperglycemia: Secondary | ICD-10-CM | POA: Diagnosis not present

## 2018-10-21 DIAGNOSIS — J181 Lobar pneumonia, unspecified organism: Secondary | ICD-10-CM | POA: Diagnosis not present

## 2018-10-21 DIAGNOSIS — N390 Urinary tract infection, site not specified: Secondary | ICD-10-CM | POA: Diagnosis not present

## 2018-10-21 DIAGNOSIS — M6281 Muscle weakness (generalized): Secondary | ICD-10-CM | POA: Diagnosis not present

## 2018-10-22 DIAGNOSIS — M6281 Muscle weakness (generalized): Secondary | ICD-10-CM | POA: Diagnosis not present

## 2018-10-22 DIAGNOSIS — B961 Klebsiella pneumoniae [K. pneumoniae] as the cause of diseases classified elsewhere: Secondary | ICD-10-CM | POA: Diagnosis not present

## 2018-10-22 DIAGNOSIS — N390 Urinary tract infection, site not specified: Secondary | ICD-10-CM | POA: Diagnosis not present

## 2018-10-22 DIAGNOSIS — Z794 Long term (current) use of insulin: Secondary | ICD-10-CM | POA: Diagnosis not present

## 2018-10-22 DIAGNOSIS — J181 Lobar pneumonia, unspecified organism: Secondary | ICD-10-CM | POA: Diagnosis not present

## 2018-10-22 DIAGNOSIS — E1165 Type 2 diabetes mellitus with hyperglycemia: Secondary | ICD-10-CM | POA: Diagnosis not present

## 2018-10-25 DIAGNOSIS — Z794 Long term (current) use of insulin: Secondary | ICD-10-CM | POA: Diagnosis not present

## 2018-10-25 DIAGNOSIS — N390 Urinary tract infection, site not specified: Secondary | ICD-10-CM | POA: Diagnosis not present

## 2018-10-25 DIAGNOSIS — M6281 Muscle weakness (generalized): Secondary | ICD-10-CM | POA: Diagnosis not present

## 2018-10-25 DIAGNOSIS — E1165 Type 2 diabetes mellitus with hyperglycemia: Secondary | ICD-10-CM | POA: Diagnosis not present

## 2018-10-25 DIAGNOSIS — J181 Lobar pneumonia, unspecified organism: Secondary | ICD-10-CM | POA: Diagnosis not present

## 2018-10-25 DIAGNOSIS — B961 Klebsiella pneumoniae [K. pneumoniae] as the cause of diseases classified elsewhere: Secondary | ICD-10-CM | POA: Diagnosis not present

## 2018-10-28 DIAGNOSIS — E1165 Type 2 diabetes mellitus with hyperglycemia: Secondary | ICD-10-CM | POA: Diagnosis not present

## 2018-10-28 DIAGNOSIS — I639 Cerebral infarction, unspecified: Secondary | ICD-10-CM | POA: Diagnosis not present

## 2018-10-28 DIAGNOSIS — I10 Essential (primary) hypertension: Secondary | ICD-10-CM | POA: Diagnosis not present

## 2018-10-28 DIAGNOSIS — Z6827 Body mass index (BMI) 27.0-27.9, adult: Secondary | ICD-10-CM | POA: Diagnosis not present

## 2018-10-28 DIAGNOSIS — Z299 Encounter for prophylactic measures, unspecified: Secondary | ICD-10-CM | POA: Diagnosis not present

## 2018-10-28 DIAGNOSIS — N390 Urinary tract infection, site not specified: Secondary | ICD-10-CM | POA: Diagnosis not present

## 2018-11-01 DIAGNOSIS — B961 Klebsiella pneumoniae [K. pneumoniae] as the cause of diseases classified elsewhere: Secondary | ICD-10-CM | POA: Diagnosis not present

## 2018-11-01 DIAGNOSIS — E1165 Type 2 diabetes mellitus with hyperglycemia: Secondary | ICD-10-CM | POA: Diagnosis not present

## 2018-11-01 DIAGNOSIS — N390 Urinary tract infection, site not specified: Secondary | ICD-10-CM | POA: Diagnosis not present

## 2018-11-01 DIAGNOSIS — M6281 Muscle weakness (generalized): Secondary | ICD-10-CM | POA: Diagnosis not present

## 2018-11-01 DIAGNOSIS — J181 Lobar pneumonia, unspecified organism: Secondary | ICD-10-CM | POA: Diagnosis not present

## 2018-11-01 DIAGNOSIS — Z794 Long term (current) use of insulin: Secondary | ICD-10-CM | POA: Diagnosis not present

## 2018-11-04 DIAGNOSIS — Z6827 Body mass index (BMI) 27.0-27.9, adult: Secondary | ICD-10-CM | POA: Diagnosis not present

## 2018-11-04 DIAGNOSIS — I10 Essential (primary) hypertension: Secondary | ICD-10-CM | POA: Diagnosis not present

## 2018-11-04 DIAGNOSIS — I639 Cerebral infarction, unspecified: Secondary | ICD-10-CM | POA: Diagnosis not present

## 2018-11-04 DIAGNOSIS — F321 Major depressive disorder, single episode, moderate: Secondary | ICD-10-CM | POA: Diagnosis not present

## 2018-11-04 DIAGNOSIS — E1165 Type 2 diabetes mellitus with hyperglycemia: Secondary | ICD-10-CM | POA: Diagnosis not present

## 2018-11-09 DIAGNOSIS — M6281 Muscle weakness (generalized): Secondary | ICD-10-CM | POA: Diagnosis not present

## 2018-11-09 DIAGNOSIS — N390 Urinary tract infection, site not specified: Secondary | ICD-10-CM | POA: Diagnosis not present

## 2018-11-09 DIAGNOSIS — E1165 Type 2 diabetes mellitus with hyperglycemia: Secondary | ICD-10-CM | POA: Diagnosis not present

## 2018-11-09 DIAGNOSIS — B961 Klebsiella pneumoniae [K. pneumoniae] as the cause of diseases classified elsewhere: Secondary | ICD-10-CM | POA: Diagnosis not present

## 2018-11-09 DIAGNOSIS — Z794 Long term (current) use of insulin: Secondary | ICD-10-CM | POA: Diagnosis not present

## 2018-11-09 DIAGNOSIS — J181 Lobar pneumonia, unspecified organism: Secondary | ICD-10-CM | POA: Diagnosis not present

## 2018-11-11 DIAGNOSIS — N390 Urinary tract infection, site not specified: Secondary | ICD-10-CM | POA: Diagnosis not present

## 2018-11-11 DIAGNOSIS — F321 Major depressive disorder, single episode, moderate: Secondary | ICD-10-CM | POA: Diagnosis not present

## 2018-11-11 DIAGNOSIS — Z299 Encounter for prophylactic measures, unspecified: Secondary | ICD-10-CM | POA: Diagnosis not present

## 2018-11-11 DIAGNOSIS — I639 Cerebral infarction, unspecified: Secondary | ICD-10-CM | POA: Diagnosis not present

## 2018-11-11 DIAGNOSIS — Z6827 Body mass index (BMI) 27.0-27.9, adult: Secondary | ICD-10-CM | POA: Diagnosis not present

## 2018-11-11 DIAGNOSIS — I10 Essential (primary) hypertension: Secondary | ICD-10-CM | POA: Diagnosis not present

## 2018-11-11 DIAGNOSIS — E1142 Type 2 diabetes mellitus with diabetic polyneuropathy: Secondary | ICD-10-CM | POA: Diagnosis not present

## 2018-11-22 DIAGNOSIS — E559 Vitamin D deficiency, unspecified: Secondary | ICD-10-CM | POA: Diagnosis present

## 2018-11-22 DIAGNOSIS — Z794 Long term (current) use of insulin: Secondary | ICD-10-CM | POA: Diagnosis not present

## 2018-11-22 DIAGNOSIS — Z86718 Personal history of other venous thrombosis and embolism: Secondary | ICD-10-CM | POA: Diagnosis not present

## 2018-11-22 DIAGNOSIS — Z7901 Long term (current) use of anticoagulants: Secondary | ICD-10-CM | POA: Diagnosis not present

## 2018-11-22 DIAGNOSIS — G301 Alzheimer's disease with late onset: Secondary | ICD-10-CM | POA: Diagnosis not present

## 2018-11-22 DIAGNOSIS — R52 Pain, unspecified: Secondary | ICD-10-CM | POA: Diagnosis not present

## 2018-11-22 DIAGNOSIS — Z888 Allergy status to other drugs, medicaments and biological substances status: Secondary | ICD-10-CM | POA: Diagnosis not present

## 2018-11-22 DIAGNOSIS — I340 Nonrheumatic mitral (valve) insufficiency: Secondary | ICD-10-CM | POA: Diagnosis not present

## 2018-11-22 DIAGNOSIS — Z79890 Hormone replacement therapy (postmenopausal): Secondary | ICD-10-CM | POA: Diagnosis not present

## 2018-11-22 DIAGNOSIS — I471 Supraventricular tachycardia: Secondary | ICD-10-CM | POA: Diagnosis not present

## 2018-11-22 DIAGNOSIS — Z86711 Personal history of pulmonary embolism: Secondary | ICD-10-CM | POA: Diagnosis not present

## 2018-11-22 DIAGNOSIS — I6622 Occlusion and stenosis of left posterior cerebral artery: Secondary | ICD-10-CM | POA: Diagnosis not present

## 2018-11-22 DIAGNOSIS — Z7982 Long term (current) use of aspirin: Secondary | ICD-10-CM | POA: Diagnosis not present

## 2018-11-22 DIAGNOSIS — Z85828 Personal history of other malignant neoplasm of skin: Secondary | ICD-10-CM | POA: Diagnosis not present

## 2018-11-22 DIAGNOSIS — M6281 Muscle weakness (generalized): Secondary | ICD-10-CM | POA: Diagnosis not present

## 2018-11-22 DIAGNOSIS — E1165 Type 2 diabetes mellitus with hyperglycemia: Secondary | ICD-10-CM | POA: Diagnosis not present

## 2018-11-22 DIAGNOSIS — G43909 Migraine, unspecified, not intractable, without status migrainosus: Secondary | ICD-10-CM | POA: Diagnosis present

## 2018-11-22 DIAGNOSIS — I472 Ventricular tachycardia: Secondary | ICD-10-CM | POA: Diagnosis not present

## 2018-11-22 DIAGNOSIS — R4781 Slurred speech: Secondary | ICD-10-CM | POA: Diagnosis not present

## 2018-11-22 DIAGNOSIS — I519 Heart disease, unspecified: Secondary | ICD-10-CM | POA: Diagnosis not present

## 2018-11-22 DIAGNOSIS — I672 Cerebral atherosclerosis: Secondary | ICD-10-CM | POA: Diagnosis not present

## 2018-11-22 DIAGNOSIS — I10 Essential (primary) hypertension: Secondary | ICD-10-CM | POA: Diagnosis not present

## 2018-11-22 DIAGNOSIS — R41841 Cognitive communication deficit: Secondary | ICD-10-CM | POA: Diagnosis not present

## 2018-11-22 DIAGNOSIS — I6502 Occlusion and stenosis of left vertebral artery: Secondary | ICD-10-CM | POA: Diagnosis not present

## 2018-11-22 DIAGNOSIS — I69354 Hemiplegia and hemiparesis following cerebral infarction affecting left non-dominant side: Secondary | ICD-10-CM | POA: Diagnosis not present

## 2018-11-22 DIAGNOSIS — I517 Cardiomegaly: Secondary | ICD-10-CM | POA: Diagnosis not present

## 2018-11-22 DIAGNOSIS — Z9181 History of falling: Secondary | ICD-10-CM | POA: Diagnosis not present

## 2018-11-22 DIAGNOSIS — R296 Repeated falls: Secondary | ICD-10-CM | POA: Diagnosis not present

## 2018-11-22 DIAGNOSIS — Z1159 Encounter for screening for other viral diseases: Secondary | ICD-10-CM | POA: Diagnosis not present

## 2018-11-22 DIAGNOSIS — I639 Cerebral infarction, unspecified: Secondary | ICD-10-CM | POA: Diagnosis not present

## 2018-11-22 DIAGNOSIS — R001 Bradycardia, unspecified: Secondary | ICD-10-CM | POA: Diagnosis not present

## 2018-11-22 DIAGNOSIS — G8194 Hemiplegia, unspecified affecting left nondominant side: Secondary | ICD-10-CM | POA: Diagnosis not present

## 2018-11-22 DIAGNOSIS — E119 Type 2 diabetes mellitus without complications: Secondary | ICD-10-CM | POA: Diagnosis not present

## 2018-11-22 DIAGNOSIS — E782 Mixed hyperlipidemia: Secondary | ICD-10-CM | POA: Diagnosis not present

## 2018-11-22 DIAGNOSIS — R2689 Other abnormalities of gait and mobility: Secondary | ICD-10-CM | POA: Diagnosis not present

## 2018-11-22 DIAGNOSIS — R29810 Facial weakness: Secondary | ICD-10-CM | POA: Diagnosis not present

## 2018-11-22 DIAGNOSIS — F0280 Dementia in other diseases classified elsewhere without behavioral disturbance: Secondary | ICD-10-CM | POA: Diagnosis present

## 2018-11-22 DIAGNOSIS — Z79899 Other long term (current) drug therapy: Secondary | ICD-10-CM | POA: Diagnosis not present

## 2018-11-22 DIAGNOSIS — R404 Transient alteration of awareness: Secondary | ICD-10-CM | POA: Diagnosis not present

## 2018-11-22 DIAGNOSIS — R29714 NIHSS score 14: Secondary | ICD-10-CM | POA: Diagnosis present

## 2018-11-22 DIAGNOSIS — R0689 Other abnormalities of breathing: Secondary | ICD-10-CM | POA: Diagnosis not present

## 2018-11-22 DIAGNOSIS — K219 Gastro-esophageal reflux disease without esophagitis: Secondary | ICD-10-CM | POA: Diagnosis present

## 2018-11-26 DIAGNOSIS — I6502 Occlusion and stenosis of left vertebral artery: Secondary | ICD-10-CM | POA: Diagnosis not present

## 2018-11-26 DIAGNOSIS — Z86718 Personal history of other venous thrombosis and embolism: Secondary | ICD-10-CM | POA: Diagnosis not present

## 2018-11-26 DIAGNOSIS — E119 Type 2 diabetes mellitus without complications: Secondary | ICD-10-CM | POA: Diagnosis not present

## 2018-11-26 DIAGNOSIS — R41841 Cognitive communication deficit: Secondary | ICD-10-CM | POA: Diagnosis not present

## 2018-11-26 DIAGNOSIS — E782 Mixed hyperlipidemia: Secondary | ICD-10-CM | POA: Diagnosis not present

## 2018-11-26 DIAGNOSIS — I69354 Hemiplegia and hemiparesis following cerebral infarction affecting left non-dominant side: Secondary | ICD-10-CM | POA: Diagnosis not present

## 2018-11-26 DIAGNOSIS — I10 Essential (primary) hypertension: Secondary | ICD-10-CM | POA: Diagnosis not present

## 2018-11-26 DIAGNOSIS — I639 Cerebral infarction, unspecified: Secondary | ICD-10-CM | POA: Diagnosis not present

## 2018-11-26 DIAGNOSIS — I6350 Cerebral infarction due to unspecified occlusion or stenosis of unspecified cerebral artery: Secondary | ICD-10-CM | POA: Diagnosis not present

## 2018-11-26 DIAGNOSIS — R296 Repeated falls: Secondary | ICD-10-CM | POA: Diagnosis not present

## 2018-11-26 DIAGNOSIS — M6281 Muscle weakness (generalized): Secondary | ICD-10-CM | POA: Diagnosis not present

## 2018-11-26 DIAGNOSIS — E1165 Type 2 diabetes mellitus with hyperglycemia: Secondary | ICD-10-CM | POA: Diagnosis not present

## 2018-11-26 DIAGNOSIS — E0590 Thyrotoxicosis, unspecified without thyrotoxic crisis or storm: Secondary | ICD-10-CM | POA: Diagnosis not present

## 2018-11-26 DIAGNOSIS — G301 Alzheimer's disease with late onset: Secondary | ICD-10-CM | POA: Diagnosis not present

## 2018-11-26 DIAGNOSIS — Z794 Long term (current) use of insulin: Secondary | ICD-10-CM | POA: Diagnosis not present

## 2018-11-26 DIAGNOSIS — R2689 Other abnormalities of gait and mobility: Secondary | ICD-10-CM | POA: Diagnosis not present

## 2018-12-03 ENCOUNTER — Other Ambulatory Visit: Payer: Self-pay | Admitting: *Deleted

## 2018-12-03 NOTE — Patient Outreach (Signed)
Member assessed for potential [facility] Care Management needs as a benefit of  [place] Medicare.  Member is currently receiving rehab therapy at [facility].  Member discussed in weekly telephonic IDT meeting with facility staff, [facility] UM team, and writer.  Facility reports Mrs. Tuite will return to [facility] next week. Will have COVID test on 12/07/18.  Writer to plan to sign off when member transitions to ALF.    Marthenia Rolling, MSN-Ed, RN,BSN [place] Acute Care Coordinator [phone] [facility]) [phone]  (Toll free office)

## 2018-12-09 DIAGNOSIS — E119 Type 2 diabetes mellitus without complications: Secondary | ICD-10-CM | POA: Diagnosis not present

## 2018-12-09 DIAGNOSIS — I6350 Cerebral infarction due to unspecified occlusion or stenosis of unspecified cerebral artery: Secondary | ICD-10-CM | POA: Diagnosis not present

## 2018-12-09 DIAGNOSIS — E0590 Thyrotoxicosis, unspecified without thyrotoxic crisis or storm: Secondary | ICD-10-CM | POA: Diagnosis not present

## 2018-12-11 ENCOUNTER — Other Ambulatory Visit: Payer: Self-pay | Admitting: *Deleted

## 2018-12-11 DIAGNOSIS — M5030 Other cervical disc degeneration, unspecified cervical region: Secondary | ICD-10-CM | POA: Diagnosis not present

## 2018-12-11 DIAGNOSIS — Z7902 Long term (current) use of antithrombotics/antiplatelets: Secondary | ICD-10-CM | POA: Diagnosis not present

## 2018-12-11 DIAGNOSIS — Z794 Long term (current) use of insulin: Secondary | ICD-10-CM | POA: Diagnosis not present

## 2018-12-11 DIAGNOSIS — F329 Major depressive disorder, single episode, unspecified: Secondary | ICD-10-CM | POA: Diagnosis not present

## 2018-12-11 DIAGNOSIS — I10 Essential (primary) hypertension: Secondary | ICD-10-CM | POA: Diagnosis not present

## 2018-12-11 DIAGNOSIS — I69354 Hemiplegia and hemiparesis following cerebral infarction affecting left non-dominant side: Secondary | ICD-10-CM | POA: Diagnosis not present

## 2018-12-11 DIAGNOSIS — I2782 Chronic pulmonary embolism: Secondary | ICD-10-CM | POA: Diagnosis not present

## 2018-12-11 DIAGNOSIS — H8109 Meniere's disease, unspecified ear: Secondary | ICD-10-CM | POA: Diagnosis not present

## 2018-12-11 DIAGNOSIS — E1165 Type 2 diabetes mellitus with hyperglycemia: Secondary | ICD-10-CM | POA: Diagnosis not present

## 2018-12-11 NOTE — Patient Outreach (Signed)
Late entry for 12/10/18  Member assessed for potential [facility] Care Management needs as a benefit of  [place] Medicare.  Member discussed in weekly telephonic IDT meeting with [facility] SNF staff, [facility] UM team, and writer.  Facility reports Mrs. Kizewski returned to [facility] on 12/09/18.  Will sign off. No identifiable [facility] Care Management needs.   Marthenia Rolling, MSN-Ed, RN,BSN [place] Acute Care Coordinator [phone] [facility]) [phone]  (Toll free office)

## 2018-12-16 DIAGNOSIS — Z299 Encounter for prophylactic measures, unspecified: Secondary | ICD-10-CM | POA: Diagnosis not present

## 2018-12-16 DIAGNOSIS — E1142 Type 2 diabetes mellitus with diabetic polyneuropathy: Secondary | ICD-10-CM | POA: Diagnosis not present

## 2018-12-16 DIAGNOSIS — E1165 Type 2 diabetes mellitus with hyperglycemia: Secondary | ICD-10-CM | POA: Diagnosis not present

## 2018-12-16 DIAGNOSIS — F321 Major depressive disorder, single episode, moderate: Secondary | ICD-10-CM | POA: Diagnosis not present

## 2018-12-16 DIAGNOSIS — Z6827 Body mass index (BMI) 27.0-27.9, adult: Secondary | ICD-10-CM | POA: Diagnosis not present

## 2018-12-16 DIAGNOSIS — I10 Essential (primary) hypertension: Secondary | ICD-10-CM | POA: Diagnosis not present

## 2018-12-22 DIAGNOSIS — M79674 Pain in right toe(s): Secondary | ICD-10-CM | POA: Diagnosis not present

## 2018-12-22 DIAGNOSIS — L600 Ingrowing nail: Secondary | ICD-10-CM | POA: Diagnosis not present

## 2018-12-23 DIAGNOSIS — Z299 Encounter for prophylactic measures, unspecified: Secondary | ICD-10-CM | POA: Diagnosis not present

## 2018-12-23 DIAGNOSIS — B370 Candidal stomatitis: Secondary | ICD-10-CM | POA: Diagnosis not present

## 2018-12-23 DIAGNOSIS — E1142 Type 2 diabetes mellitus with diabetic polyneuropathy: Secondary | ICD-10-CM | POA: Diagnosis not present

## 2018-12-23 DIAGNOSIS — E1165 Type 2 diabetes mellitus with hyperglycemia: Secondary | ICD-10-CM | POA: Diagnosis not present

## 2018-12-23 DIAGNOSIS — F321 Major depressive disorder, single episode, moderate: Secondary | ICD-10-CM | POA: Diagnosis not present

## 2018-12-23 DIAGNOSIS — Z6827 Body mass index (BMI) 27.0-27.9, adult: Secondary | ICD-10-CM | POA: Diagnosis not present

## 2018-12-23 DIAGNOSIS — I10 Essential (primary) hypertension: Secondary | ICD-10-CM | POA: Diagnosis not present

## 2018-12-30 ENCOUNTER — Ambulatory Visit: Payer: Medicare Other | Admitting: Neurology

## 2018-12-30 ENCOUNTER — Telehealth: Payer: Self-pay

## 2018-12-30 ENCOUNTER — Encounter: Payer: Self-pay | Admitting: Neurology

## 2018-12-30 DIAGNOSIS — I639 Cerebral infarction, unspecified: Secondary | ICD-10-CM | POA: Diagnosis not present

## 2018-12-30 DIAGNOSIS — R112 Nausea with vomiting, unspecified: Secondary | ICD-10-CM | POA: Diagnosis not present

## 2018-12-30 DIAGNOSIS — R52 Pain, unspecified: Secondary | ICD-10-CM | POA: Diagnosis not present

## 2018-12-30 DIAGNOSIS — R1084 Generalized abdominal pain: Secondary | ICD-10-CM | POA: Diagnosis not present

## 2018-12-30 DIAGNOSIS — I10 Essential (primary) hypertension: Secondary | ICD-10-CM | POA: Diagnosis not present

## 2018-12-30 DIAGNOSIS — I4891 Unspecified atrial fibrillation: Secondary | ICD-10-CM | POA: Diagnosis present

## 2018-12-30 DIAGNOSIS — K8020 Calculus of gallbladder without cholecystitis without obstruction: Secondary | ICD-10-CM | POA: Diagnosis not present

## 2018-12-30 DIAGNOSIS — K8062 Calculus of gallbladder and bile duct with acute cholecystitis without obstruction: Secondary | ICD-10-CM | POA: Diagnosis not present

## 2018-12-30 DIAGNOSIS — R1111 Vomiting without nausea: Secondary | ICD-10-CM | POA: Diagnosis not present

## 2018-12-30 DIAGNOSIS — Z8673 Personal history of transient ischemic attack (TIA), and cerebral infarction without residual deficits: Secondary | ICD-10-CM | POA: Diagnosis not present

## 2018-12-30 DIAGNOSIS — K819 Cholecystitis, unspecified: Secondary | ICD-10-CM | POA: Diagnosis not present

## 2018-12-30 DIAGNOSIS — Z20828 Contact with and (suspected) exposure to other viral communicable diseases: Secondary | ICD-10-CM | POA: Diagnosis present

## 2018-12-30 DIAGNOSIS — K660 Peritoneal adhesions (postprocedural) (postinfection): Secondary | ICD-10-CM | POA: Diagnosis present

## 2018-12-30 DIAGNOSIS — N390 Urinary tract infection, site not specified: Secondary | ICD-10-CM | POA: Diagnosis not present

## 2018-12-30 DIAGNOSIS — Z7901 Long term (current) use of anticoagulants: Secondary | ICD-10-CM | POA: Diagnosis not present

## 2018-12-30 DIAGNOSIS — E1165 Type 2 diabetes mellitus with hyperglycemia: Secondary | ICD-10-CM | POA: Diagnosis not present

## 2018-12-30 DIAGNOSIS — B9689 Other specified bacterial agents as the cause of diseases classified elsewhere: Secondary | ICD-10-CM | POA: Diagnosis present

## 2018-12-30 DIAGNOSIS — Z6827 Body mass index (BMI) 27.0-27.9, adult: Secondary | ICD-10-CM | POA: Diagnosis not present

## 2018-12-30 DIAGNOSIS — Z86711 Personal history of pulmonary embolism: Secondary | ICD-10-CM | POA: Diagnosis not present

## 2018-12-30 DIAGNOSIS — K810 Acute cholecystitis: Secondary | ICD-10-CM | POA: Diagnosis not present

## 2018-12-30 DIAGNOSIS — R109 Unspecified abdominal pain: Secondary | ICD-10-CM | POA: Diagnosis not present

## 2018-12-30 DIAGNOSIS — Z794 Long term (current) use of insulin: Secondary | ICD-10-CM | POA: Diagnosis not present

## 2018-12-30 DIAGNOSIS — R110 Nausea: Secondary | ICD-10-CM | POA: Diagnosis not present

## 2018-12-30 DIAGNOSIS — Z299 Encounter for prophylactic measures, unspecified: Secondary | ICD-10-CM | POA: Diagnosis not present

## 2018-12-30 NOTE — Telephone Encounter (Signed)
Patient no show for new appt with provider.

## 2019-01-01 DIAGNOSIS — K810 Acute cholecystitis: Secondary | ICD-10-CM | POA: Diagnosis not present

## 2019-01-06 DIAGNOSIS — I10 Essential (primary) hypertension: Secondary | ICD-10-CM | POA: Diagnosis not present

## 2019-01-06 DIAGNOSIS — F321 Major depressive disorder, single episode, moderate: Secondary | ICD-10-CM | POA: Diagnosis not present

## 2019-01-06 DIAGNOSIS — Z6827 Body mass index (BMI) 27.0-27.9, adult: Secondary | ICD-10-CM | POA: Diagnosis not present

## 2019-01-06 DIAGNOSIS — Z299 Encounter for prophylactic measures, unspecified: Secondary | ICD-10-CM | POA: Diagnosis not present

## 2019-01-06 DIAGNOSIS — B372 Candidiasis of skin and nail: Secondary | ICD-10-CM | POA: Diagnosis not present

## 2019-01-06 DIAGNOSIS — K810 Acute cholecystitis: Secondary | ICD-10-CM | POA: Diagnosis not present

## 2019-01-10 DIAGNOSIS — M5030 Other cervical disc degeneration, unspecified cervical region: Secondary | ICD-10-CM | POA: Diagnosis not present

## 2019-01-10 DIAGNOSIS — I69354 Hemiplegia and hemiparesis following cerebral infarction affecting left non-dominant side: Secondary | ICD-10-CM | POA: Diagnosis not present

## 2019-01-10 DIAGNOSIS — I2782 Chronic pulmonary embolism: Secondary | ICD-10-CM | POA: Diagnosis not present

## 2019-01-10 DIAGNOSIS — Z7902 Long term (current) use of antithrombotics/antiplatelets: Secondary | ICD-10-CM | POA: Diagnosis not present

## 2019-01-10 DIAGNOSIS — I10 Essential (primary) hypertension: Secondary | ICD-10-CM | POA: Diagnosis not present

## 2019-01-10 DIAGNOSIS — Z794 Long term (current) use of insulin: Secondary | ICD-10-CM | POA: Diagnosis not present

## 2019-01-10 DIAGNOSIS — F329 Major depressive disorder, single episode, unspecified: Secondary | ICD-10-CM | POA: Diagnosis not present

## 2019-01-10 DIAGNOSIS — E1165 Type 2 diabetes mellitus with hyperglycemia: Secondary | ICD-10-CM | POA: Diagnosis not present

## 2019-01-10 DIAGNOSIS — H8109 Meniere's disease, unspecified ear: Secondary | ICD-10-CM | POA: Diagnosis not present

## 2019-01-11 DIAGNOSIS — E1165 Type 2 diabetes mellitus with hyperglycemia: Secondary | ICD-10-CM | POA: Diagnosis not present

## 2019-01-11 DIAGNOSIS — I10 Essential (primary) hypertension: Secondary | ICD-10-CM | POA: Diagnosis not present

## 2019-01-11 DIAGNOSIS — I2782 Chronic pulmonary embolism: Secondary | ICD-10-CM | POA: Diagnosis not present

## 2019-01-11 DIAGNOSIS — H8109 Meniere's disease, unspecified ear: Secondary | ICD-10-CM | POA: Diagnosis not present

## 2019-01-11 DIAGNOSIS — I69354 Hemiplegia and hemiparesis following cerebral infarction affecting left non-dominant side: Secondary | ICD-10-CM | POA: Diagnosis not present

## 2019-01-11 DIAGNOSIS — M5030 Other cervical disc degeneration, unspecified cervical region: Secondary | ICD-10-CM | POA: Diagnosis not present

## 2019-01-13 DIAGNOSIS — F321 Major depressive disorder, single episode, moderate: Secondary | ICD-10-CM | POA: Diagnosis not present

## 2019-01-13 DIAGNOSIS — Z299 Encounter for prophylactic measures, unspecified: Secondary | ICD-10-CM | POA: Diagnosis not present

## 2019-01-13 DIAGNOSIS — Z6827 Body mass index (BMI) 27.0-27.9, adult: Secondary | ICD-10-CM | POA: Diagnosis not present

## 2019-01-13 DIAGNOSIS — E1165 Type 2 diabetes mellitus with hyperglycemia: Secondary | ICD-10-CM | POA: Diagnosis not present

## 2019-01-13 DIAGNOSIS — B370 Candidal stomatitis: Secondary | ICD-10-CM | POA: Diagnosis not present

## 2019-01-13 DIAGNOSIS — E1142 Type 2 diabetes mellitus with diabetic polyneuropathy: Secondary | ICD-10-CM | POA: Diagnosis not present

## 2019-01-13 DIAGNOSIS — I10 Essential (primary) hypertension: Secondary | ICD-10-CM | POA: Diagnosis not present

## 2019-01-18 DIAGNOSIS — R109 Unspecified abdominal pain: Secondary | ICD-10-CM | POA: Diagnosis not present

## 2019-01-19 DIAGNOSIS — R109 Unspecified abdominal pain: Secondary | ICD-10-CM | POA: Diagnosis not present

## 2019-01-19 DIAGNOSIS — R197 Diarrhea, unspecified: Secondary | ICD-10-CM | POA: Diagnosis not present

## 2019-01-19 DIAGNOSIS — Z6827 Body mass index (BMI) 27.0-27.9, adult: Secondary | ICD-10-CM | POA: Diagnosis not present

## 2019-01-20 DIAGNOSIS — R109 Unspecified abdominal pain: Secondary | ICD-10-CM | POA: Diagnosis not present

## 2019-01-27 DIAGNOSIS — M545 Low back pain: Secondary | ICD-10-CM | POA: Diagnosis not present

## 2019-01-27 DIAGNOSIS — I639 Cerebral infarction, unspecified: Secondary | ICD-10-CM | POA: Diagnosis not present

## 2019-01-27 DIAGNOSIS — E1142 Type 2 diabetes mellitus with diabetic polyneuropathy: Secondary | ICD-10-CM | POA: Diagnosis not present

## 2019-01-27 DIAGNOSIS — I10 Essential (primary) hypertension: Secondary | ICD-10-CM | POA: Diagnosis not present

## 2019-01-27 DIAGNOSIS — Z6827 Body mass index (BMI) 27.0-27.9, adult: Secondary | ICD-10-CM | POA: Diagnosis not present

## 2019-01-27 DIAGNOSIS — Z299 Encounter for prophylactic measures, unspecified: Secondary | ICD-10-CM | POA: Diagnosis not present

## 2019-01-27 DIAGNOSIS — E1165 Type 2 diabetes mellitus with hyperglycemia: Secondary | ICD-10-CM | POA: Diagnosis not present

## 2019-02-03 DIAGNOSIS — I10 Essential (primary) hypertension: Secondary | ICD-10-CM | POA: Diagnosis not present

## 2019-02-03 DIAGNOSIS — I2699 Other pulmonary embolism without acute cor pulmonale: Secondary | ICD-10-CM | POA: Diagnosis not present

## 2019-02-03 DIAGNOSIS — I639 Cerebral infarction, unspecified: Secondary | ICD-10-CM | POA: Diagnosis not present

## 2019-02-03 DIAGNOSIS — E1142 Type 2 diabetes mellitus with diabetic polyneuropathy: Secondary | ICD-10-CM | POA: Diagnosis not present

## 2019-02-03 DIAGNOSIS — E1165 Type 2 diabetes mellitus with hyperglycemia: Secondary | ICD-10-CM | POA: Diagnosis not present

## 2019-02-03 DIAGNOSIS — Z299 Encounter for prophylactic measures, unspecified: Secondary | ICD-10-CM | POA: Diagnosis not present

## 2019-02-03 DIAGNOSIS — Z6827 Body mass index (BMI) 27.0-27.9, adult: Secondary | ICD-10-CM | POA: Diagnosis not present

## 2019-02-10 DIAGNOSIS — I10 Essential (primary) hypertension: Secondary | ICD-10-CM | POA: Diagnosis not present

## 2019-02-10 DIAGNOSIS — Z299 Encounter for prophylactic measures, unspecified: Secondary | ICD-10-CM | POA: Diagnosis not present

## 2019-02-10 DIAGNOSIS — Z713 Dietary counseling and surveillance: Secondary | ICD-10-CM | POA: Diagnosis not present

## 2019-02-10 DIAGNOSIS — Z6827 Body mass index (BMI) 27.0-27.9, adult: Secondary | ICD-10-CM | POA: Diagnosis not present

## 2019-02-10 DIAGNOSIS — E1165 Type 2 diabetes mellitus with hyperglycemia: Secondary | ICD-10-CM | POA: Diagnosis not present

## 2019-02-24 DIAGNOSIS — Z0000 Encounter for general adult medical examination without abnormal findings: Secondary | ICD-10-CM | POA: Diagnosis not present

## 2019-02-24 DIAGNOSIS — E1142 Type 2 diabetes mellitus with diabetic polyneuropathy: Secondary | ICD-10-CM | POA: Diagnosis not present

## 2019-02-24 DIAGNOSIS — Z299 Encounter for prophylactic measures, unspecified: Secondary | ICD-10-CM | POA: Diagnosis not present

## 2019-02-24 DIAGNOSIS — E1165 Type 2 diabetes mellitus with hyperglycemia: Secondary | ICD-10-CM | POA: Diagnosis not present

## 2019-02-24 DIAGNOSIS — Z1331 Encounter for screening for depression: Secondary | ICD-10-CM | POA: Diagnosis not present

## 2019-02-24 DIAGNOSIS — Z6827 Body mass index (BMI) 27.0-27.9, adult: Secondary | ICD-10-CM | POA: Diagnosis not present

## 2019-02-24 DIAGNOSIS — Z7189 Other specified counseling: Secondary | ICD-10-CM | POA: Diagnosis not present

## 2019-02-24 DIAGNOSIS — I10 Essential (primary) hypertension: Secondary | ICD-10-CM | POA: Diagnosis not present

## 2019-03-11 DIAGNOSIS — E1165 Type 2 diabetes mellitus with hyperglycemia: Secondary | ICD-10-CM | POA: Diagnosis not present

## 2019-03-11 DIAGNOSIS — Z299 Encounter for prophylactic measures, unspecified: Secondary | ICD-10-CM | POA: Diagnosis not present

## 2019-03-11 DIAGNOSIS — E1142 Type 2 diabetes mellitus with diabetic polyneuropathy: Secondary | ICD-10-CM | POA: Diagnosis not present

## 2019-03-11 DIAGNOSIS — I639 Cerebral infarction, unspecified: Secondary | ICD-10-CM | POA: Diagnosis not present

## 2019-03-11 DIAGNOSIS — Z6827 Body mass index (BMI) 27.0-27.9, adult: Secondary | ICD-10-CM | POA: Diagnosis not present

## 2019-03-11 DIAGNOSIS — I10 Essential (primary) hypertension: Secondary | ICD-10-CM | POA: Diagnosis not present

## 2019-03-22 DIAGNOSIS — Z299 Encounter for prophylactic measures, unspecified: Secondary | ICD-10-CM | POA: Diagnosis not present

## 2019-03-22 DIAGNOSIS — E1142 Type 2 diabetes mellitus with diabetic polyneuropathy: Secondary | ICD-10-CM | POA: Diagnosis not present

## 2019-03-22 DIAGNOSIS — F321 Major depressive disorder, single episode, moderate: Secondary | ICD-10-CM | POA: Diagnosis not present

## 2019-03-22 DIAGNOSIS — I639 Cerebral infarction, unspecified: Secondary | ICD-10-CM | POA: Diagnosis not present

## 2019-03-22 DIAGNOSIS — U071 COVID-19: Secondary | ICD-10-CM | POA: Diagnosis not present

## 2019-03-24 DIAGNOSIS — M810 Age-related osteoporosis without current pathological fracture: Secondary | ICD-10-CM | POA: Diagnosis not present

## 2019-03-24 DIAGNOSIS — U071 COVID-19: Secondary | ICD-10-CM | POA: Diagnosis not present

## 2019-03-24 DIAGNOSIS — F329 Major depressive disorder, single episode, unspecified: Secondary | ICD-10-CM | POA: Diagnosis not present

## 2019-03-24 DIAGNOSIS — E119 Type 2 diabetes mellitus without complications: Secondary | ICD-10-CM | POA: Diagnosis not present

## 2019-03-26 ENCOUNTER — Other Ambulatory Visit: Payer: Self-pay | Admitting: *Deleted

## 2019-03-26 NOTE — Patient Outreach (Addendum)
Screened for potential [facility] Care Management needs as a benefit of  [employer] Medicare.  Mrs. Wozny is currently receiving skilled therapy at [facility]. She was at [place] prior.  Writer will continue to follow for disposition plans and for potential [facility] Care Management needs while member resides at [facility].   Marthenia Rolling, MSN-Ed, RN,BSN [place] Acute Care Coordinator [phone] [facility]) [phone]  (Toll free office)

## 2019-04-03 DIAGNOSIS — M810 Age-related osteoporosis without current pathological fracture: Secondary | ICD-10-CM | POA: Diagnosis not present

## 2019-04-03 DIAGNOSIS — I482 Chronic atrial fibrillation, unspecified: Secondary | ICD-10-CM | POA: Diagnosis not present

## 2019-04-03 DIAGNOSIS — U071 COVID-19: Secondary | ICD-10-CM | POA: Diagnosis not present

## 2019-04-03 DIAGNOSIS — F329 Major depressive disorder, single episode, unspecified: Secondary | ICD-10-CM | POA: Diagnosis not present

## 2019-04-29 DIAGNOSIS — E1142 Type 2 diabetes mellitus with diabetic polyneuropathy: Secondary | ICD-10-CM | POA: Diagnosis not present

## 2019-04-29 DIAGNOSIS — M79645 Pain in left finger(s): Secondary | ICD-10-CM | POA: Diagnosis not present

## 2019-04-29 DIAGNOSIS — I10 Essential (primary) hypertension: Secondary | ICD-10-CM | POA: Diagnosis not present

## 2019-04-29 DIAGNOSIS — Z299 Encounter for prophylactic measures, unspecified: Secondary | ICD-10-CM | POA: Diagnosis not present

## 2019-04-29 DIAGNOSIS — Z789 Other specified health status: Secondary | ICD-10-CM | POA: Diagnosis not present

## 2019-04-29 DIAGNOSIS — S6992XS Unspecified injury of left wrist, hand and finger(s), sequela: Secondary | ICD-10-CM | POA: Diagnosis not present

## 2019-06-14 DIAGNOSIS — H40013 Open angle with borderline findings, low risk, bilateral: Secondary | ICD-10-CM | POA: Diagnosis not present

## 2019-06-14 DIAGNOSIS — H524 Presbyopia: Secondary | ICD-10-CM | POA: Diagnosis not present

## 2019-06-16 DIAGNOSIS — M79674 Pain in right toe(s): Secondary | ICD-10-CM | POA: Diagnosis not present

## 2019-06-16 DIAGNOSIS — B351 Tinea unguium: Secondary | ICD-10-CM | POA: Diagnosis not present

## 2019-06-24 DIAGNOSIS — E1142 Type 2 diabetes mellitus with diabetic polyneuropathy: Secondary | ICD-10-CM | POA: Diagnosis not present

## 2019-06-24 DIAGNOSIS — E1165 Type 2 diabetes mellitus with hyperglycemia: Secondary | ICD-10-CM | POA: Diagnosis not present

## 2019-06-24 DIAGNOSIS — Z299 Encounter for prophylactic measures, unspecified: Secondary | ICD-10-CM | POA: Diagnosis not present

## 2019-06-24 DIAGNOSIS — I10 Essential (primary) hypertension: Secondary | ICD-10-CM | POA: Diagnosis not present

## 2019-07-15 DIAGNOSIS — E1165 Type 2 diabetes mellitus with hyperglycemia: Secondary | ICD-10-CM | POA: Diagnosis not present

## 2019-07-15 DIAGNOSIS — Z299 Encounter for prophylactic measures, unspecified: Secondary | ICD-10-CM | POA: Diagnosis not present

## 2019-07-15 DIAGNOSIS — E1142 Type 2 diabetes mellitus with diabetic polyneuropathy: Secondary | ICD-10-CM | POA: Diagnosis not present

## 2019-07-15 DIAGNOSIS — I10 Essential (primary) hypertension: Secondary | ICD-10-CM | POA: Diagnosis not present

## 2019-07-22 DIAGNOSIS — E1165 Type 2 diabetes mellitus with hyperglycemia: Secondary | ICD-10-CM | POA: Diagnosis not present

## 2019-07-22 DIAGNOSIS — I10 Essential (primary) hypertension: Secondary | ICD-10-CM | POA: Diagnosis not present

## 2019-07-22 DIAGNOSIS — E1142 Type 2 diabetes mellitus with diabetic polyneuropathy: Secondary | ICD-10-CM | POA: Diagnosis not present

## 2019-07-22 DIAGNOSIS — Z299 Encounter for prophylactic measures, unspecified: Secondary | ICD-10-CM | POA: Diagnosis not present

## 2019-07-29 DIAGNOSIS — I2699 Other pulmonary embolism without acute cor pulmonale: Secondary | ICD-10-CM | POA: Diagnosis not present

## 2019-07-29 DIAGNOSIS — I10 Essential (primary) hypertension: Secondary | ICD-10-CM | POA: Diagnosis not present

## 2019-07-29 DIAGNOSIS — E1165 Type 2 diabetes mellitus with hyperglycemia: Secondary | ICD-10-CM | POA: Diagnosis not present

## 2019-07-29 DIAGNOSIS — Z299 Encounter for prophylactic measures, unspecified: Secondary | ICD-10-CM | POA: Diagnosis not present

## 2019-07-29 DIAGNOSIS — E1142 Type 2 diabetes mellitus with diabetic polyneuropathy: Secondary | ICD-10-CM | POA: Diagnosis not present

## 2019-08-05 DIAGNOSIS — R52 Pain, unspecified: Secondary | ICD-10-CM | POA: Diagnosis not present

## 2019-08-05 DIAGNOSIS — K219 Gastro-esophageal reflux disease without esophagitis: Secondary | ICD-10-CM | POA: Diagnosis not present

## 2019-08-05 DIAGNOSIS — W19XXXA Unspecified fall, initial encounter: Secondary | ICD-10-CM | POA: Diagnosis not present

## 2019-08-05 DIAGNOSIS — Z299 Encounter for prophylactic measures, unspecified: Secondary | ICD-10-CM | POA: Diagnosis not present

## 2019-08-05 DIAGNOSIS — I10 Essential (primary) hypertension: Secondary | ICD-10-CM | POA: Diagnosis not present

## 2019-08-09 DIAGNOSIS — E119 Type 2 diabetes mellitus without complications: Secondary | ICD-10-CM | POA: Diagnosis not present

## 2019-08-13 DIAGNOSIS — R350 Frequency of micturition: Secondary | ICD-10-CM | POA: Diagnosis not present

## 2019-09-02 DIAGNOSIS — I10 Essential (primary) hypertension: Secondary | ICD-10-CM | POA: Diagnosis not present

## 2019-09-02 DIAGNOSIS — K219 Gastro-esophageal reflux disease without esophagitis: Secondary | ICD-10-CM | POA: Diagnosis not present

## 2019-09-02 DIAGNOSIS — E1165 Type 2 diabetes mellitus with hyperglycemia: Secondary | ICD-10-CM | POA: Diagnosis not present

## 2019-09-02 DIAGNOSIS — G4700 Insomnia, unspecified: Secondary | ICD-10-CM | POA: Diagnosis not present

## 2019-09-02 DIAGNOSIS — Z299 Encounter for prophylactic measures, unspecified: Secondary | ICD-10-CM | POA: Diagnosis not present

## 2019-09-08 DIAGNOSIS — E119 Type 2 diabetes mellitus without complications: Secondary | ICD-10-CM | POA: Diagnosis not present

## 2019-09-09 DIAGNOSIS — E1165 Type 2 diabetes mellitus with hyperglycemia: Secondary | ICD-10-CM | POA: Diagnosis not present

## 2019-09-09 DIAGNOSIS — Z299 Encounter for prophylactic measures, unspecified: Secondary | ICD-10-CM | POA: Diagnosis not present

## 2019-09-09 DIAGNOSIS — I10 Essential (primary) hypertension: Secondary | ICD-10-CM | POA: Diagnosis not present

## 2019-09-09 DIAGNOSIS — E1142 Type 2 diabetes mellitus with diabetic polyneuropathy: Secondary | ICD-10-CM | POA: Diagnosis not present

## 2019-09-13 DIAGNOSIS — E113313 Type 2 diabetes mellitus with moderate nonproliferative diabetic retinopathy with macular edema, bilateral: Secondary | ICD-10-CM | POA: Diagnosis not present

## 2019-10-07 DIAGNOSIS — I10 Essential (primary) hypertension: Secondary | ICD-10-CM | POA: Diagnosis not present

## 2019-10-07 DIAGNOSIS — Z299 Encounter for prophylactic measures, unspecified: Secondary | ICD-10-CM | POA: Diagnosis not present

## 2019-10-07 DIAGNOSIS — E1165 Type 2 diabetes mellitus with hyperglycemia: Secondary | ICD-10-CM | POA: Diagnosis not present

## 2019-10-07 DIAGNOSIS — K580 Irritable bowel syndrome with diarrhea: Secondary | ICD-10-CM | POA: Diagnosis not present

## 2019-10-07 DIAGNOSIS — E1142 Type 2 diabetes mellitus with diabetic polyneuropathy: Secondary | ICD-10-CM | POA: Diagnosis not present

## 2019-10-09 DIAGNOSIS — E119 Type 2 diabetes mellitus without complications: Secondary | ICD-10-CM | POA: Diagnosis not present

## 2019-10-21 DIAGNOSIS — E1165 Type 2 diabetes mellitus with hyperglycemia: Secondary | ICD-10-CM | POA: Diagnosis not present

## 2019-10-21 DIAGNOSIS — E1142 Type 2 diabetes mellitus with diabetic polyneuropathy: Secondary | ICD-10-CM | POA: Diagnosis not present

## 2019-10-21 DIAGNOSIS — I10 Essential (primary) hypertension: Secondary | ICD-10-CM | POA: Diagnosis not present

## 2019-10-21 DIAGNOSIS — Z299 Encounter for prophylactic measures, unspecified: Secondary | ICD-10-CM | POA: Diagnosis not present

## 2019-10-26 DIAGNOSIS — R52 Pain, unspecified: Secondary | ICD-10-CM | POA: Diagnosis not present

## 2019-10-26 DIAGNOSIS — E119 Type 2 diabetes mellitus without complications: Secondary | ICD-10-CM | POA: Diagnosis not present

## 2019-10-26 DIAGNOSIS — Z794 Long term (current) use of insulin: Secondary | ICD-10-CM | POA: Diagnosis not present

## 2019-10-26 DIAGNOSIS — Z79899 Other long term (current) drug therapy: Secondary | ICD-10-CM | POA: Diagnosis not present

## 2019-10-26 DIAGNOSIS — K219 Gastro-esophageal reflux disease without esophagitis: Secondary | ICD-10-CM | POA: Diagnosis not present

## 2019-10-26 DIAGNOSIS — Z7901 Long term (current) use of anticoagulants: Secondary | ICD-10-CM | POA: Diagnosis not present

## 2019-10-26 DIAGNOSIS — R609 Edema, unspecified: Secondary | ICD-10-CM | POA: Diagnosis not present

## 2019-10-26 DIAGNOSIS — S0990XA Unspecified injury of head, initial encounter: Secondary | ICD-10-CM | POA: Diagnosis not present

## 2019-10-26 DIAGNOSIS — I739 Peripheral vascular disease, unspecified: Secondary | ICD-10-CM | POA: Diagnosis not present

## 2019-10-26 DIAGNOSIS — Z7982 Long term (current) use of aspirin: Secondary | ICD-10-CM | POA: Diagnosis not present

## 2019-10-26 DIAGNOSIS — M25571 Pain in right ankle and joints of right foot: Secondary | ICD-10-CM | POA: Diagnosis not present

## 2019-10-26 DIAGNOSIS — G319 Degenerative disease of nervous system, unspecified: Secondary | ICD-10-CM | POA: Diagnosis not present

## 2019-10-26 DIAGNOSIS — W19XXXA Unspecified fall, initial encounter: Secondary | ICD-10-CM | POA: Diagnosis not present

## 2019-10-26 DIAGNOSIS — Z9049 Acquired absence of other specified parts of digestive tract: Secondary | ICD-10-CM | POA: Diagnosis not present

## 2019-10-26 DIAGNOSIS — S93401A Sprain of unspecified ligament of right ankle, initial encounter: Secondary | ICD-10-CM | POA: Diagnosis not present

## 2019-10-26 DIAGNOSIS — I708 Atherosclerosis of other arteries: Secondary | ICD-10-CM | POA: Diagnosis not present

## 2019-10-26 DIAGNOSIS — Z8673 Personal history of transient ischemic attack (TIA), and cerebral infarction without residual deficits: Secondary | ICD-10-CM | POA: Diagnosis not present

## 2019-10-28 DIAGNOSIS — S93401A Sprain of unspecified ligament of right ankle, initial encounter: Secondary | ICD-10-CM | POA: Diagnosis not present

## 2019-10-28 DIAGNOSIS — I10 Essential (primary) hypertension: Secondary | ICD-10-CM | POA: Diagnosis not present

## 2019-10-28 DIAGNOSIS — E1142 Type 2 diabetes mellitus with diabetic polyneuropathy: Secondary | ICD-10-CM | POA: Diagnosis not present

## 2019-10-28 DIAGNOSIS — E1165 Type 2 diabetes mellitus with hyperglycemia: Secondary | ICD-10-CM | POA: Diagnosis not present

## 2019-10-28 DIAGNOSIS — Z299 Encounter for prophylactic measures, unspecified: Secondary | ICD-10-CM | POA: Diagnosis not present

## 2019-11-02 DIAGNOSIS — M25571 Pain in right ankle and joints of right foot: Secondary | ICD-10-CM | POA: Diagnosis not present

## 2019-11-04 DIAGNOSIS — I10 Essential (primary) hypertension: Secondary | ICD-10-CM | POA: Diagnosis not present

## 2019-11-04 DIAGNOSIS — E1142 Type 2 diabetes mellitus with diabetic polyneuropathy: Secondary | ICD-10-CM | POA: Diagnosis not present

## 2019-11-04 DIAGNOSIS — E1165 Type 2 diabetes mellitus with hyperglycemia: Secondary | ICD-10-CM | POA: Diagnosis not present

## 2019-11-04 DIAGNOSIS — S93401A Sprain of unspecified ligament of right ankle, initial encounter: Secondary | ICD-10-CM | POA: Diagnosis not present

## 2019-11-04 DIAGNOSIS — Z299 Encounter for prophylactic measures, unspecified: Secondary | ICD-10-CM | POA: Diagnosis not present

## 2019-12-02 DIAGNOSIS — E1165 Type 2 diabetes mellitus with hyperglycemia: Secondary | ICD-10-CM | POA: Diagnosis not present

## 2019-12-02 DIAGNOSIS — I10 Essential (primary) hypertension: Secondary | ICD-10-CM | POA: Diagnosis not present

## 2019-12-02 DIAGNOSIS — E1142 Type 2 diabetes mellitus with diabetic polyneuropathy: Secondary | ICD-10-CM | POA: Diagnosis not present

## 2019-12-02 DIAGNOSIS — Z299 Encounter for prophylactic measures, unspecified: Secondary | ICD-10-CM | POA: Diagnosis not present

## 2019-12-02 DIAGNOSIS — R350 Frequency of micturition: Secondary | ICD-10-CM | POA: Diagnosis not present

## 2019-12-06 DIAGNOSIS — N390 Urinary tract infection, site not specified: Secondary | ICD-10-CM | POA: Diagnosis not present

## 2019-12-06 DIAGNOSIS — Z299 Encounter for prophylactic measures, unspecified: Secondary | ICD-10-CM | POA: Diagnosis not present

## 2019-12-06 DIAGNOSIS — E1142 Type 2 diabetes mellitus with diabetic polyneuropathy: Secondary | ICD-10-CM | POA: Diagnosis not present

## 2019-12-06 DIAGNOSIS — E1165 Type 2 diabetes mellitus with hyperglycemia: Secondary | ICD-10-CM | POA: Diagnosis not present

## 2019-12-06 DIAGNOSIS — I10 Essential (primary) hypertension: Secondary | ICD-10-CM | POA: Diagnosis not present

## 2019-12-09 DIAGNOSIS — E119 Type 2 diabetes mellitus without complications: Secondary | ICD-10-CM | POA: Diagnosis not present

## 2019-12-23 DIAGNOSIS — I10 Essential (primary) hypertension: Secondary | ICD-10-CM | POA: Diagnosis not present

## 2019-12-23 DIAGNOSIS — E1142 Type 2 diabetes mellitus with diabetic polyneuropathy: Secondary | ICD-10-CM | POA: Diagnosis not present

## 2019-12-23 DIAGNOSIS — Z299 Encounter for prophylactic measures, unspecified: Secondary | ICD-10-CM | POA: Diagnosis not present

## 2019-12-23 DIAGNOSIS — E1165 Type 2 diabetes mellitus with hyperglycemia: Secondary | ICD-10-CM | POA: Diagnosis not present

## 2019-12-30 DIAGNOSIS — I10 Essential (primary) hypertension: Secondary | ICD-10-CM | POA: Diagnosis not present

## 2019-12-30 DIAGNOSIS — E1142 Type 2 diabetes mellitus with diabetic polyneuropathy: Secondary | ICD-10-CM | POA: Diagnosis not present

## 2019-12-30 DIAGNOSIS — Z299 Encounter for prophylactic measures, unspecified: Secondary | ICD-10-CM | POA: Diagnosis not present

## 2019-12-30 DIAGNOSIS — M545 Low back pain: Secondary | ICD-10-CM | POA: Diagnosis not present

## 2020-01-01 DIAGNOSIS — M545 Low back pain: Secondary | ICD-10-CM | POA: Diagnosis not present

## 2020-01-06 DIAGNOSIS — M5136 Other intervertebral disc degeneration, lumbar region: Secondary | ICD-10-CM | POA: Diagnosis not present

## 2020-01-06 DIAGNOSIS — Z299 Encounter for prophylactic measures, unspecified: Secondary | ICD-10-CM | POA: Diagnosis not present

## 2020-01-06 DIAGNOSIS — I10 Essential (primary) hypertension: Secondary | ICD-10-CM | POA: Diagnosis not present

## 2020-01-06 DIAGNOSIS — R195 Other fecal abnormalities: Secondary | ICD-10-CM | POA: Diagnosis not present

## 2020-01-06 DIAGNOSIS — E1165 Type 2 diabetes mellitus with hyperglycemia: Secondary | ICD-10-CM | POA: Diagnosis not present

## 2020-01-09 DIAGNOSIS — E119 Type 2 diabetes mellitus without complications: Secondary | ICD-10-CM | POA: Diagnosis not present

## 2020-01-13 DIAGNOSIS — E1142 Type 2 diabetes mellitus with diabetic polyneuropathy: Secondary | ICD-10-CM | POA: Diagnosis not present

## 2020-01-13 DIAGNOSIS — Z299 Encounter for prophylactic measures, unspecified: Secondary | ICD-10-CM | POA: Diagnosis not present

## 2020-01-13 DIAGNOSIS — E1165 Type 2 diabetes mellitus with hyperglycemia: Secondary | ICD-10-CM | POA: Diagnosis not present

## 2020-01-13 DIAGNOSIS — I2699 Other pulmonary embolism without acute cor pulmonale: Secondary | ICD-10-CM | POA: Diagnosis not present

## 2020-01-14 DIAGNOSIS — E1165 Type 2 diabetes mellitus with hyperglycemia: Secondary | ICD-10-CM | POA: Diagnosis not present

## 2020-01-14 DIAGNOSIS — Z794 Long term (current) use of insulin: Secondary | ICD-10-CM | POA: Diagnosis not present

## 2020-01-14 DIAGNOSIS — I10 Essential (primary) hypertension: Secondary | ICD-10-CM | POA: Diagnosis not present

## 2020-01-14 DIAGNOSIS — M47817 Spondylosis without myelopathy or radiculopathy, lumbosacral region: Secondary | ICD-10-CM | POA: Diagnosis not present

## 2020-01-14 DIAGNOSIS — M810 Age-related osteoporosis without current pathological fracture: Secondary | ICD-10-CM | POA: Diagnosis not present

## 2020-01-14 DIAGNOSIS — Z7901 Long term (current) use of anticoagulants: Secondary | ICD-10-CM | POA: Diagnosis not present

## 2020-01-14 DIAGNOSIS — Z8673 Personal history of transient ischemic attack (TIA), and cerebral infarction without residual deficits: Secondary | ICD-10-CM | POA: Diagnosis not present

## 2020-01-14 DIAGNOSIS — M5136 Other intervertebral disc degeneration, lumbar region: Secondary | ICD-10-CM | POA: Diagnosis not present

## 2020-01-14 DIAGNOSIS — Z86711 Personal history of pulmonary embolism: Secondary | ICD-10-CM | POA: Diagnosis not present

## 2020-01-14 DIAGNOSIS — E1142 Type 2 diabetes mellitus with diabetic polyneuropathy: Secondary | ICD-10-CM | POA: Diagnosis not present

## 2020-01-14 DIAGNOSIS — I4891 Unspecified atrial fibrillation: Secondary | ICD-10-CM | POA: Diagnosis not present

## 2020-01-14 DIAGNOSIS — Z86718 Personal history of other venous thrombosis and embolism: Secondary | ICD-10-CM | POA: Diagnosis not present

## 2020-01-18 DIAGNOSIS — Z7901 Long term (current) use of anticoagulants: Secondary | ICD-10-CM | POA: Diagnosis not present

## 2020-01-18 DIAGNOSIS — Z8673 Personal history of transient ischemic attack (TIA), and cerebral infarction without residual deficits: Secondary | ICD-10-CM | POA: Diagnosis not present

## 2020-01-18 DIAGNOSIS — E1142 Type 2 diabetes mellitus with diabetic polyneuropathy: Secondary | ICD-10-CM | POA: Diagnosis not present

## 2020-01-18 DIAGNOSIS — M810 Age-related osteoporosis without current pathological fracture: Secondary | ICD-10-CM | POA: Diagnosis not present

## 2020-01-18 DIAGNOSIS — Z86711 Personal history of pulmonary embolism: Secondary | ICD-10-CM | POA: Diagnosis not present

## 2020-01-18 DIAGNOSIS — Z86718 Personal history of other venous thrombosis and embolism: Secondary | ICD-10-CM | POA: Diagnosis not present

## 2020-01-18 DIAGNOSIS — Z794 Long term (current) use of insulin: Secondary | ICD-10-CM | POA: Diagnosis not present

## 2020-01-18 DIAGNOSIS — I4891 Unspecified atrial fibrillation: Secondary | ICD-10-CM | POA: Diagnosis not present

## 2020-01-18 DIAGNOSIS — M5136 Other intervertebral disc degeneration, lumbar region: Secondary | ICD-10-CM | POA: Diagnosis not present

## 2020-01-18 DIAGNOSIS — M47817 Spondylosis without myelopathy or radiculopathy, lumbosacral region: Secondary | ICD-10-CM | POA: Diagnosis not present

## 2020-01-18 DIAGNOSIS — I10 Essential (primary) hypertension: Secondary | ICD-10-CM | POA: Diagnosis not present

## 2020-01-18 DIAGNOSIS — E1165 Type 2 diabetes mellitus with hyperglycemia: Secondary | ICD-10-CM | POA: Diagnosis not present

## 2020-01-21 DIAGNOSIS — M810 Age-related osteoporosis without current pathological fracture: Secondary | ICD-10-CM | POA: Diagnosis not present

## 2020-01-21 DIAGNOSIS — E1142 Type 2 diabetes mellitus with diabetic polyneuropathy: Secondary | ICD-10-CM | POA: Diagnosis not present

## 2020-01-21 DIAGNOSIS — I4891 Unspecified atrial fibrillation: Secondary | ICD-10-CM | POA: Diagnosis not present

## 2020-01-21 DIAGNOSIS — Z86718 Personal history of other venous thrombosis and embolism: Secondary | ICD-10-CM | POA: Diagnosis not present

## 2020-01-21 DIAGNOSIS — Z7901 Long term (current) use of anticoagulants: Secondary | ICD-10-CM | POA: Diagnosis not present

## 2020-01-21 DIAGNOSIS — Z8673 Personal history of transient ischemic attack (TIA), and cerebral infarction without residual deficits: Secondary | ICD-10-CM | POA: Diagnosis not present

## 2020-01-21 DIAGNOSIS — E1165 Type 2 diabetes mellitus with hyperglycemia: Secondary | ICD-10-CM | POA: Diagnosis not present

## 2020-01-21 DIAGNOSIS — Z86711 Personal history of pulmonary embolism: Secondary | ICD-10-CM | POA: Diagnosis not present

## 2020-01-21 DIAGNOSIS — Z794 Long term (current) use of insulin: Secondary | ICD-10-CM | POA: Diagnosis not present

## 2020-01-21 DIAGNOSIS — M47817 Spondylosis without myelopathy or radiculopathy, lumbosacral region: Secondary | ICD-10-CM | POA: Diagnosis not present

## 2020-01-21 DIAGNOSIS — M5136 Other intervertebral disc degeneration, lumbar region: Secondary | ICD-10-CM | POA: Diagnosis not present

## 2020-01-21 DIAGNOSIS — I10 Essential (primary) hypertension: Secondary | ICD-10-CM | POA: Diagnosis not present

## 2020-01-24 DIAGNOSIS — Z7901 Long term (current) use of anticoagulants: Secondary | ICD-10-CM | POA: Diagnosis not present

## 2020-01-24 DIAGNOSIS — I10 Essential (primary) hypertension: Secondary | ICD-10-CM | POA: Diagnosis not present

## 2020-01-24 DIAGNOSIS — M5136 Other intervertebral disc degeneration, lumbar region: Secondary | ICD-10-CM | POA: Diagnosis not present

## 2020-01-24 DIAGNOSIS — E1142 Type 2 diabetes mellitus with diabetic polyneuropathy: Secondary | ICD-10-CM | POA: Diagnosis not present

## 2020-01-24 DIAGNOSIS — Z8673 Personal history of transient ischemic attack (TIA), and cerebral infarction without residual deficits: Secondary | ICD-10-CM | POA: Diagnosis not present

## 2020-01-24 DIAGNOSIS — M47817 Spondylosis without myelopathy or radiculopathy, lumbosacral region: Secondary | ICD-10-CM | POA: Diagnosis not present

## 2020-01-24 DIAGNOSIS — Z86711 Personal history of pulmonary embolism: Secondary | ICD-10-CM | POA: Diagnosis not present

## 2020-01-24 DIAGNOSIS — M810 Age-related osteoporosis without current pathological fracture: Secondary | ICD-10-CM | POA: Diagnosis not present

## 2020-01-24 DIAGNOSIS — Z86718 Personal history of other venous thrombosis and embolism: Secondary | ICD-10-CM | POA: Diagnosis not present

## 2020-01-24 DIAGNOSIS — E1165 Type 2 diabetes mellitus with hyperglycemia: Secondary | ICD-10-CM | POA: Diagnosis not present

## 2020-01-24 DIAGNOSIS — I4891 Unspecified atrial fibrillation: Secondary | ICD-10-CM | POA: Diagnosis not present

## 2020-01-24 DIAGNOSIS — Z794 Long term (current) use of insulin: Secondary | ICD-10-CM | POA: Diagnosis not present

## 2020-01-25 DIAGNOSIS — M5136 Other intervertebral disc degeneration, lumbar region: Secondary | ICD-10-CM | POA: Diagnosis not present

## 2020-01-25 DIAGNOSIS — E1165 Type 2 diabetes mellitus with hyperglycemia: Secondary | ICD-10-CM | POA: Diagnosis not present

## 2020-01-25 DIAGNOSIS — E1142 Type 2 diabetes mellitus with diabetic polyneuropathy: Secondary | ICD-10-CM | POA: Diagnosis not present

## 2020-01-25 DIAGNOSIS — M47817 Spondylosis without myelopathy or radiculopathy, lumbosacral region: Secondary | ICD-10-CM | POA: Diagnosis not present

## 2020-01-26 DIAGNOSIS — I10 Essential (primary) hypertension: Secondary | ICD-10-CM | POA: Diagnosis not present

## 2020-01-26 DIAGNOSIS — Z7901 Long term (current) use of anticoagulants: Secondary | ICD-10-CM | POA: Diagnosis not present

## 2020-01-26 DIAGNOSIS — I4891 Unspecified atrial fibrillation: Secondary | ICD-10-CM | POA: Diagnosis not present

## 2020-01-26 DIAGNOSIS — Z8673 Personal history of transient ischemic attack (TIA), and cerebral infarction without residual deficits: Secondary | ICD-10-CM | POA: Diagnosis not present

## 2020-01-26 DIAGNOSIS — M47817 Spondylosis without myelopathy or radiculopathy, lumbosacral region: Secondary | ICD-10-CM | POA: Diagnosis not present

## 2020-01-26 DIAGNOSIS — Z86711 Personal history of pulmonary embolism: Secondary | ICD-10-CM | POA: Diagnosis not present

## 2020-01-26 DIAGNOSIS — M810 Age-related osteoporosis without current pathological fracture: Secondary | ICD-10-CM | POA: Diagnosis not present

## 2020-01-26 DIAGNOSIS — E1165 Type 2 diabetes mellitus with hyperglycemia: Secondary | ICD-10-CM | POA: Diagnosis not present

## 2020-01-26 DIAGNOSIS — M5136 Other intervertebral disc degeneration, lumbar region: Secondary | ICD-10-CM | POA: Diagnosis not present

## 2020-01-26 DIAGNOSIS — Z794 Long term (current) use of insulin: Secondary | ICD-10-CM | POA: Diagnosis not present

## 2020-01-26 DIAGNOSIS — E1142 Type 2 diabetes mellitus with diabetic polyneuropathy: Secondary | ICD-10-CM | POA: Diagnosis not present

## 2020-01-26 DIAGNOSIS — Z86718 Personal history of other venous thrombosis and embolism: Secondary | ICD-10-CM | POA: Diagnosis not present

## 2020-01-27 DIAGNOSIS — E1165 Type 2 diabetes mellitus with hyperglycemia: Secondary | ICD-10-CM | POA: Diagnosis not present

## 2020-01-27 DIAGNOSIS — Z299 Encounter for prophylactic measures, unspecified: Secondary | ICD-10-CM | POA: Diagnosis not present

## 2020-01-27 DIAGNOSIS — I10 Essential (primary) hypertension: Secondary | ICD-10-CM | POA: Diagnosis not present

## 2020-01-27 DIAGNOSIS — R197 Diarrhea, unspecified: Secondary | ICD-10-CM | POA: Diagnosis not present

## 2020-01-31 IMAGING — CT CT HEAD W/O CM
4 series · 16 of 47 positions shown, 18 images · non-contrast
Comparison: [HOSPITAL] Mehri Funkhouser CT 07/03/2016. brain MRI
06/03/2016.

CLINICAL DATA: 76-year-old female with encephalopathy. Diagnosed
with dementia but recent increased confusion.

EXAM:
CT HEAD WITHOUT CONTRAST
TECHNIQUE: Contiguous axial images were obtained from the base of the skull
through the vertex without intravenous contrast.

[Series 3: head without · axial · non-contrast · 0.43mm/px · z∈[-130,-15]mm · 6 of 33 slices shown, 8 images]
[im 5/33  brain]
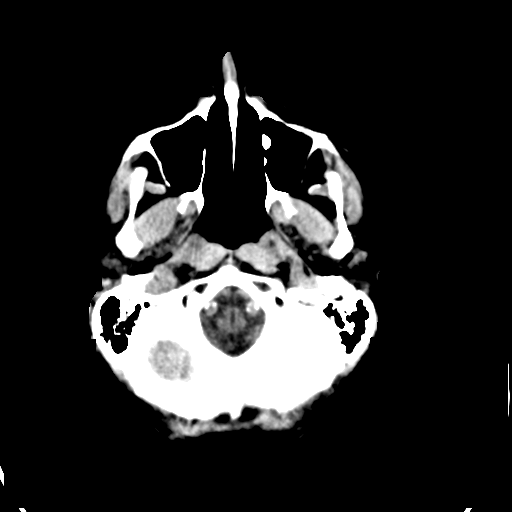
[im 5/33  bone]
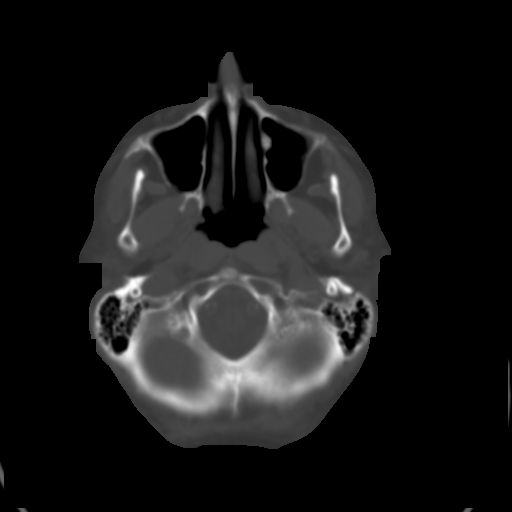
[im 10/33  brain]
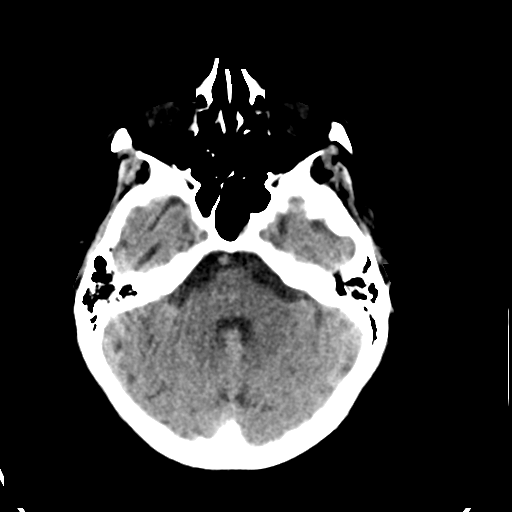
[im 14/33  brain]
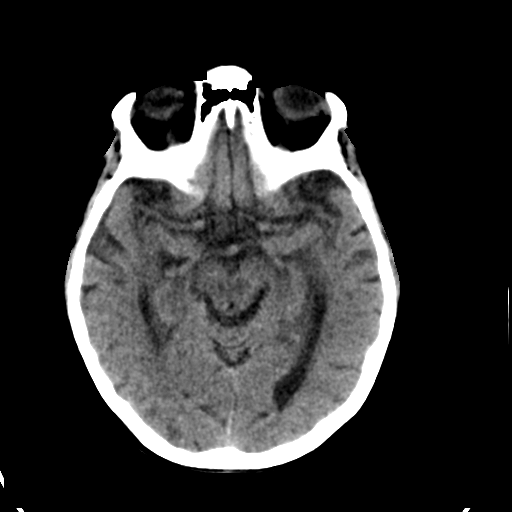
[im 19/33  brain]
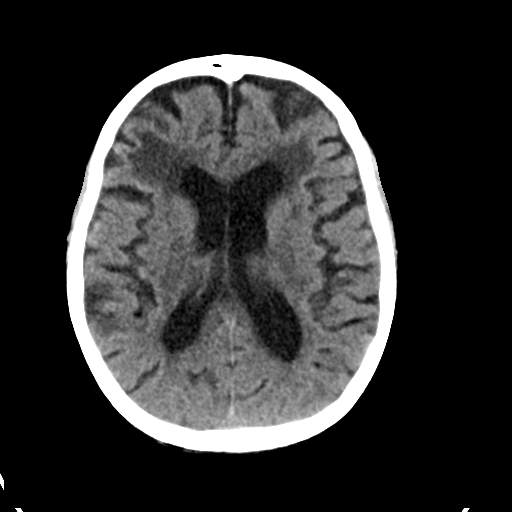
[im 23/33  brain]
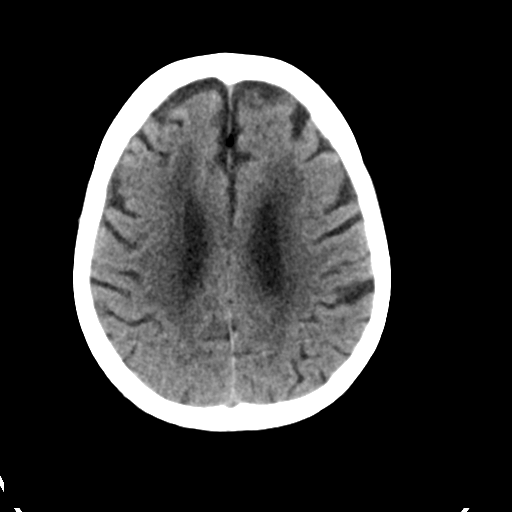
[im 23/33  bone]
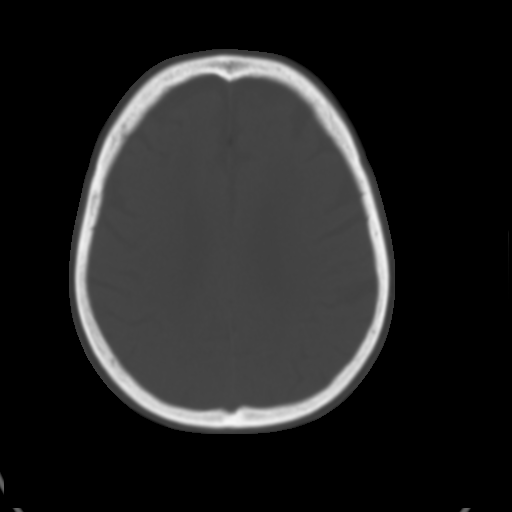
[im 28/33  brain]
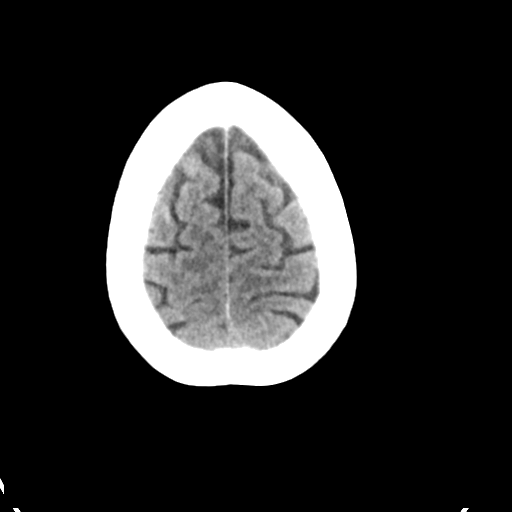

[Series 4: head bone · axial · 0.43mm/px · z∈[-134,-78]mm · 4 of 85 slices shown]
[im 9/85  bone]
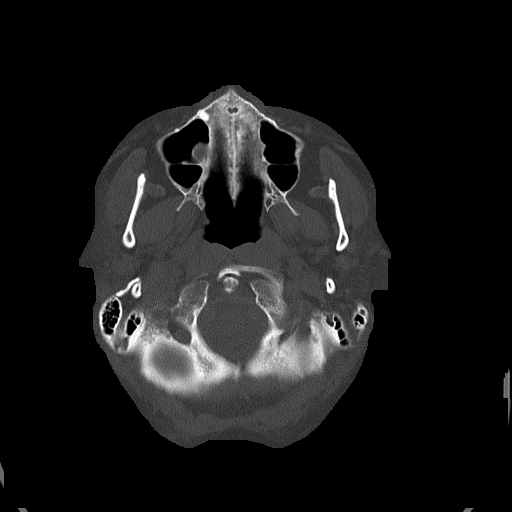
[im 17/85  bone]
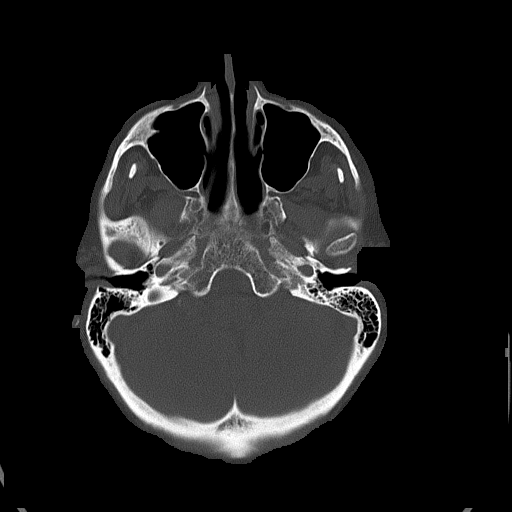
[im 29/85  bone]
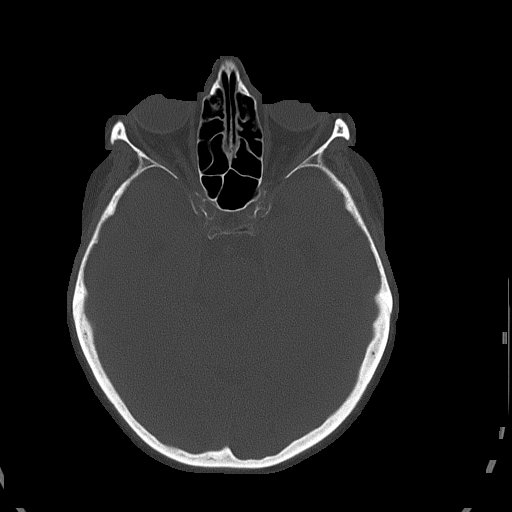
[im 37/85  bone]
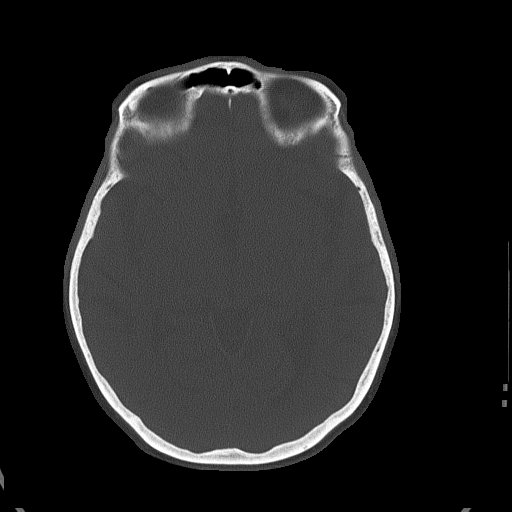

[Series 5: head without cor · coronal · non-contrast · 0.33mm/px · 3 of 67 slices shown]
[im 23/67  brain]
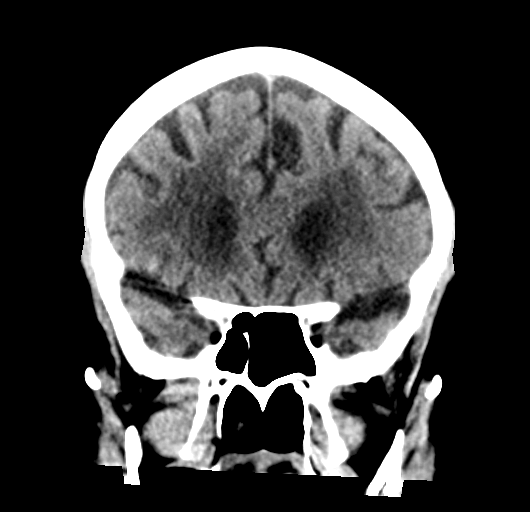
[im 30/67  brain]
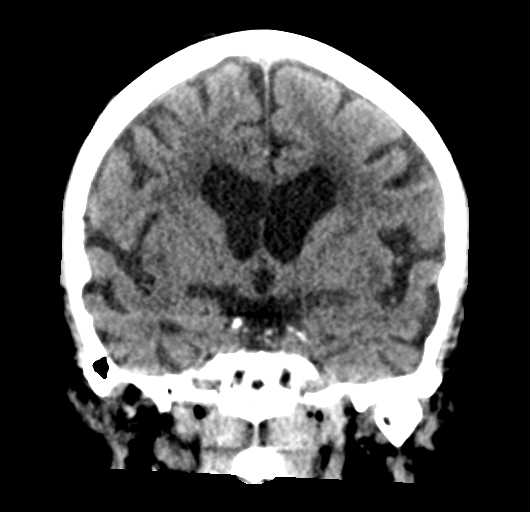
[im 37/67  brain]
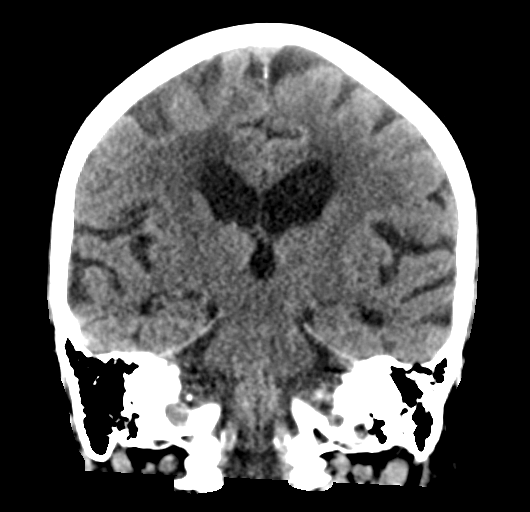

[Series 6: head without sag · sagittal · non-contrast · 0.33mm/px · 3 of 53 slices shown]
[im 18/53  brain]
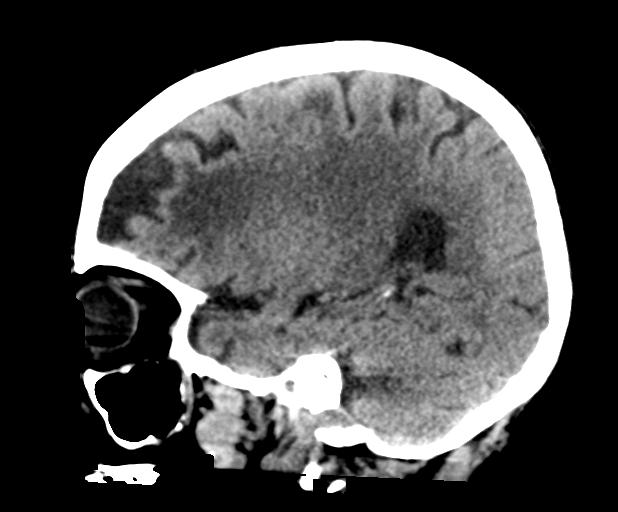
[im 27/53  brain]
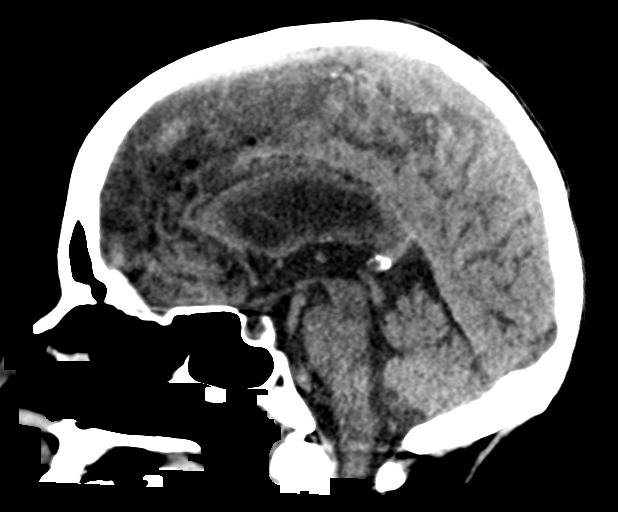
[im 35/53  brain]
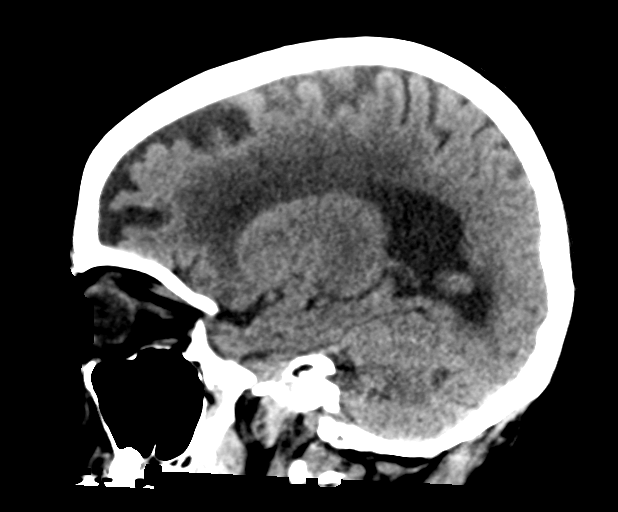

[16 of 47 positions shown; findings below may reference images not displayed]

FINDINGS: Brain: Stable cerebral volume since 6136. Confluent bilateral
anterior frontal lobe white matter hypodensity. Small area of
superimposed chronic cortical encephalomalacia in the right anterior
frontal lobe (series 3, image 20).

Small chronic infarcts in the cerebellum or better demonstrated by
MRI.

No midline shift, ventriculomegaly, mass effect, evidence of mass
lesion, intracranial hemorrhage or evidence of cortically based
acute infarction.

Vascular: Calcified atherosclerosis at the skull base. No suspicious
intracranial vascular hyperdensity.

Skull: No acute osseous abnormality identified.

Sinuses/Orbits: Visualized paranasal sinuses and mastoids are stable
and well pneumatized.

Other: No acute orbit or scalp soft tissue findings.
IMPRESSION: 1.  No acute intracranial abnormality.
2. Stable non contrast CT appearance of the brain since 6136: small
chronic infarcts in the cerebellum and anterior right frontal lobe
with superimposed chronic white matter disease.

## 2020-02-01 DIAGNOSIS — M810 Age-related osteoporosis without current pathological fracture: Secondary | ICD-10-CM | POA: Diagnosis not present

## 2020-02-01 DIAGNOSIS — Z794 Long term (current) use of insulin: Secondary | ICD-10-CM | POA: Diagnosis not present

## 2020-02-01 DIAGNOSIS — I10 Essential (primary) hypertension: Secondary | ICD-10-CM | POA: Diagnosis not present

## 2020-02-01 DIAGNOSIS — Z8673 Personal history of transient ischemic attack (TIA), and cerebral infarction without residual deficits: Secondary | ICD-10-CM | POA: Diagnosis not present

## 2020-02-01 DIAGNOSIS — M5136 Other intervertebral disc degeneration, lumbar region: Secondary | ICD-10-CM | POA: Diagnosis not present

## 2020-02-01 DIAGNOSIS — Z86718 Personal history of other venous thrombosis and embolism: Secondary | ICD-10-CM | POA: Diagnosis not present

## 2020-02-01 DIAGNOSIS — M47817 Spondylosis without myelopathy or radiculopathy, lumbosacral region: Secondary | ICD-10-CM | POA: Diagnosis not present

## 2020-02-01 DIAGNOSIS — E1142 Type 2 diabetes mellitus with diabetic polyneuropathy: Secondary | ICD-10-CM | POA: Diagnosis not present

## 2020-02-01 DIAGNOSIS — Z86711 Personal history of pulmonary embolism: Secondary | ICD-10-CM | POA: Diagnosis not present

## 2020-02-01 DIAGNOSIS — I4891 Unspecified atrial fibrillation: Secondary | ICD-10-CM | POA: Diagnosis not present

## 2020-02-01 DIAGNOSIS — Z7901 Long term (current) use of anticoagulants: Secondary | ICD-10-CM | POA: Diagnosis not present

## 2020-02-01 DIAGNOSIS — E1165 Type 2 diabetes mellitus with hyperglycemia: Secondary | ICD-10-CM | POA: Diagnosis not present

## 2020-02-03 DIAGNOSIS — Z299 Encounter for prophylactic measures, unspecified: Secondary | ICD-10-CM | POA: Diagnosis not present

## 2020-02-03 DIAGNOSIS — R197 Diarrhea, unspecified: Secondary | ICD-10-CM | POA: Diagnosis not present

## 2020-02-03 DIAGNOSIS — I10 Essential (primary) hypertension: Secondary | ICD-10-CM | POA: Diagnosis not present

## 2020-02-03 DIAGNOSIS — E1165 Type 2 diabetes mellitus with hyperglycemia: Secondary | ICD-10-CM | POA: Diagnosis not present

## 2020-02-04 DIAGNOSIS — E1165 Type 2 diabetes mellitus with hyperglycemia: Secondary | ICD-10-CM | POA: Diagnosis not present

## 2020-02-04 DIAGNOSIS — Z794 Long term (current) use of insulin: Secondary | ICD-10-CM | POA: Diagnosis not present

## 2020-02-04 DIAGNOSIS — I4891 Unspecified atrial fibrillation: Secondary | ICD-10-CM | POA: Diagnosis not present

## 2020-02-04 DIAGNOSIS — M5136 Other intervertebral disc degeneration, lumbar region: Secondary | ICD-10-CM | POA: Diagnosis not present

## 2020-02-04 DIAGNOSIS — Z86718 Personal history of other venous thrombosis and embolism: Secondary | ICD-10-CM | POA: Diagnosis not present

## 2020-02-04 DIAGNOSIS — Z7901 Long term (current) use of anticoagulants: Secondary | ICD-10-CM | POA: Diagnosis not present

## 2020-02-04 DIAGNOSIS — Z86711 Personal history of pulmonary embolism: Secondary | ICD-10-CM | POA: Diagnosis not present

## 2020-02-04 DIAGNOSIS — Z8673 Personal history of transient ischemic attack (TIA), and cerebral infarction without residual deficits: Secondary | ICD-10-CM | POA: Diagnosis not present

## 2020-02-04 DIAGNOSIS — E1142 Type 2 diabetes mellitus with diabetic polyneuropathy: Secondary | ICD-10-CM | POA: Diagnosis not present

## 2020-02-04 DIAGNOSIS — M47817 Spondylosis without myelopathy or radiculopathy, lumbosacral region: Secondary | ICD-10-CM | POA: Diagnosis not present

## 2020-02-04 DIAGNOSIS — M810 Age-related osteoporosis without current pathological fracture: Secondary | ICD-10-CM | POA: Diagnosis not present

## 2020-02-04 DIAGNOSIS — I10 Essential (primary) hypertension: Secondary | ICD-10-CM | POA: Diagnosis not present

## 2020-02-08 DIAGNOSIS — Z86711 Personal history of pulmonary embolism: Secondary | ICD-10-CM | POA: Diagnosis not present

## 2020-02-08 DIAGNOSIS — M47817 Spondylosis without myelopathy or radiculopathy, lumbosacral region: Secondary | ICD-10-CM | POA: Diagnosis not present

## 2020-02-08 DIAGNOSIS — E1165 Type 2 diabetes mellitus with hyperglycemia: Secondary | ICD-10-CM | POA: Diagnosis not present

## 2020-02-08 DIAGNOSIS — E1142 Type 2 diabetes mellitus with diabetic polyneuropathy: Secondary | ICD-10-CM | POA: Diagnosis not present

## 2020-02-08 DIAGNOSIS — Z794 Long term (current) use of insulin: Secondary | ICD-10-CM | POA: Diagnosis not present

## 2020-02-08 DIAGNOSIS — M5136 Other intervertebral disc degeneration, lumbar region: Secondary | ICD-10-CM | POA: Diagnosis not present

## 2020-02-08 DIAGNOSIS — Z8673 Personal history of transient ischemic attack (TIA), and cerebral infarction without residual deficits: Secondary | ICD-10-CM | POA: Diagnosis not present

## 2020-02-08 DIAGNOSIS — I4891 Unspecified atrial fibrillation: Secondary | ICD-10-CM | POA: Diagnosis not present

## 2020-02-08 DIAGNOSIS — Z86718 Personal history of other venous thrombosis and embolism: Secondary | ICD-10-CM | POA: Diagnosis not present

## 2020-02-08 DIAGNOSIS — E119 Type 2 diabetes mellitus without complications: Secondary | ICD-10-CM | POA: Diagnosis not present

## 2020-02-08 DIAGNOSIS — I10 Essential (primary) hypertension: Secondary | ICD-10-CM | POA: Diagnosis not present

## 2020-02-08 DIAGNOSIS — M810 Age-related osteoporosis without current pathological fracture: Secondary | ICD-10-CM | POA: Diagnosis not present

## 2020-02-08 DIAGNOSIS — Z7901 Long term (current) use of anticoagulants: Secondary | ICD-10-CM | POA: Diagnosis not present

## 2020-02-10 DIAGNOSIS — E1165 Type 2 diabetes mellitus with hyperglycemia: Secondary | ICD-10-CM | POA: Diagnosis not present

## 2020-02-10 DIAGNOSIS — I82401 Acute embolism and thrombosis of unspecified deep veins of right lower extremity: Secondary | ICD-10-CM | POA: Diagnosis not present

## 2020-02-10 DIAGNOSIS — E1142 Type 2 diabetes mellitus with diabetic polyneuropathy: Secondary | ICD-10-CM | POA: Diagnosis not present

## 2020-02-10 DIAGNOSIS — Z299 Encounter for prophylactic measures, unspecified: Secondary | ICD-10-CM | POA: Diagnosis not present

## 2020-02-10 DIAGNOSIS — I10 Essential (primary) hypertension: Secondary | ICD-10-CM | POA: Diagnosis not present

## 2020-02-11 DIAGNOSIS — Z8673 Personal history of transient ischemic attack (TIA), and cerebral infarction without residual deficits: Secondary | ICD-10-CM | POA: Diagnosis not present

## 2020-02-11 DIAGNOSIS — Z7901 Long term (current) use of anticoagulants: Secondary | ICD-10-CM | POA: Diagnosis not present

## 2020-02-11 DIAGNOSIS — I10 Essential (primary) hypertension: Secondary | ICD-10-CM | POA: Diagnosis not present

## 2020-02-11 DIAGNOSIS — E1142 Type 2 diabetes mellitus with diabetic polyneuropathy: Secondary | ICD-10-CM | POA: Diagnosis not present

## 2020-02-11 DIAGNOSIS — M5136 Other intervertebral disc degeneration, lumbar region: Secondary | ICD-10-CM | POA: Diagnosis not present

## 2020-02-11 DIAGNOSIS — Z794 Long term (current) use of insulin: Secondary | ICD-10-CM | POA: Diagnosis not present

## 2020-02-11 DIAGNOSIS — I4891 Unspecified atrial fibrillation: Secondary | ICD-10-CM | POA: Diagnosis not present

## 2020-02-11 DIAGNOSIS — M810 Age-related osteoporosis without current pathological fracture: Secondary | ICD-10-CM | POA: Diagnosis not present

## 2020-02-11 DIAGNOSIS — E1165 Type 2 diabetes mellitus with hyperglycemia: Secondary | ICD-10-CM | POA: Diagnosis not present

## 2020-02-11 DIAGNOSIS — M47817 Spondylosis without myelopathy or radiculopathy, lumbosacral region: Secondary | ICD-10-CM | POA: Diagnosis not present

## 2020-02-11 DIAGNOSIS — Z86718 Personal history of other venous thrombosis and embolism: Secondary | ICD-10-CM | POA: Diagnosis not present

## 2020-02-11 DIAGNOSIS — Z86711 Personal history of pulmonary embolism: Secondary | ICD-10-CM | POA: Diagnosis not present

## 2020-02-14 DIAGNOSIS — Z7901 Long term (current) use of anticoagulants: Secondary | ICD-10-CM | POA: Diagnosis not present

## 2020-02-14 DIAGNOSIS — Z794 Long term (current) use of insulin: Secondary | ICD-10-CM | POA: Diagnosis not present

## 2020-02-14 DIAGNOSIS — E1142 Type 2 diabetes mellitus with diabetic polyneuropathy: Secondary | ICD-10-CM | POA: Diagnosis not present

## 2020-02-14 DIAGNOSIS — Z86711 Personal history of pulmonary embolism: Secondary | ICD-10-CM | POA: Diagnosis not present

## 2020-02-14 DIAGNOSIS — M810 Age-related osteoporosis without current pathological fracture: Secondary | ICD-10-CM | POA: Diagnosis not present

## 2020-02-14 DIAGNOSIS — Z86718 Personal history of other venous thrombosis and embolism: Secondary | ICD-10-CM | POA: Diagnosis not present

## 2020-02-14 DIAGNOSIS — Z8673 Personal history of transient ischemic attack (TIA), and cerebral infarction without residual deficits: Secondary | ICD-10-CM | POA: Diagnosis not present

## 2020-02-14 DIAGNOSIS — I10 Essential (primary) hypertension: Secondary | ICD-10-CM | POA: Diagnosis not present

## 2020-02-14 DIAGNOSIS — I4891 Unspecified atrial fibrillation: Secondary | ICD-10-CM | POA: Diagnosis not present

## 2020-02-14 DIAGNOSIS — E1165 Type 2 diabetes mellitus with hyperglycemia: Secondary | ICD-10-CM | POA: Diagnosis not present

## 2020-02-14 DIAGNOSIS — M47817 Spondylosis without myelopathy or radiculopathy, lumbosacral region: Secondary | ICD-10-CM | POA: Diagnosis not present

## 2020-02-14 DIAGNOSIS — M5136 Other intervertebral disc degeneration, lumbar region: Secondary | ICD-10-CM | POA: Diagnosis not present

## 2020-02-16 DIAGNOSIS — E1142 Type 2 diabetes mellitus with diabetic polyneuropathy: Secondary | ICD-10-CM | POA: Diagnosis not present

## 2020-02-16 DIAGNOSIS — Z86711 Personal history of pulmonary embolism: Secondary | ICD-10-CM | POA: Diagnosis not present

## 2020-02-16 DIAGNOSIS — I10 Essential (primary) hypertension: Secondary | ICD-10-CM | POA: Diagnosis not present

## 2020-02-16 DIAGNOSIS — E1165 Type 2 diabetes mellitus with hyperglycemia: Secondary | ICD-10-CM | POA: Diagnosis not present

## 2020-02-16 DIAGNOSIS — Z794 Long term (current) use of insulin: Secondary | ICD-10-CM | POA: Diagnosis not present

## 2020-02-16 DIAGNOSIS — Z8673 Personal history of transient ischemic attack (TIA), and cerebral infarction without residual deficits: Secondary | ICD-10-CM | POA: Diagnosis not present

## 2020-02-16 DIAGNOSIS — I4891 Unspecified atrial fibrillation: Secondary | ICD-10-CM | POA: Diagnosis not present

## 2020-02-16 DIAGNOSIS — Z86718 Personal history of other venous thrombosis and embolism: Secondary | ICD-10-CM | POA: Diagnosis not present

## 2020-02-16 DIAGNOSIS — Z7901 Long term (current) use of anticoagulants: Secondary | ICD-10-CM | POA: Diagnosis not present

## 2020-02-16 DIAGNOSIS — M810 Age-related osteoporosis without current pathological fracture: Secondary | ICD-10-CM | POA: Diagnosis not present

## 2020-02-16 DIAGNOSIS — M5136 Other intervertebral disc degeneration, lumbar region: Secondary | ICD-10-CM | POA: Diagnosis not present

## 2020-02-16 DIAGNOSIS — M47817 Spondylosis without myelopathy or radiculopathy, lumbosacral region: Secondary | ICD-10-CM | POA: Diagnosis not present

## 2020-02-17 DIAGNOSIS — I10 Essential (primary) hypertension: Secondary | ICD-10-CM | POA: Diagnosis not present

## 2020-02-17 DIAGNOSIS — E1142 Type 2 diabetes mellitus with diabetic polyneuropathy: Secondary | ICD-10-CM | POA: Diagnosis not present

## 2020-02-17 DIAGNOSIS — E1165 Type 2 diabetes mellitus with hyperglycemia: Secondary | ICD-10-CM | POA: Diagnosis not present

## 2020-02-17 DIAGNOSIS — Z299 Encounter for prophylactic measures, unspecified: Secondary | ICD-10-CM | POA: Diagnosis not present

## 2020-02-21 DIAGNOSIS — I10 Essential (primary) hypertension: Secondary | ICD-10-CM | POA: Diagnosis not present

## 2020-02-21 DIAGNOSIS — M5136 Other intervertebral disc degeneration, lumbar region: Secondary | ICD-10-CM | POA: Diagnosis not present

## 2020-02-21 DIAGNOSIS — M47817 Spondylosis without myelopathy or radiculopathy, lumbosacral region: Secondary | ICD-10-CM | POA: Diagnosis not present

## 2020-02-21 DIAGNOSIS — Z8673 Personal history of transient ischemic attack (TIA), and cerebral infarction without residual deficits: Secondary | ICD-10-CM | POA: Diagnosis not present

## 2020-02-21 DIAGNOSIS — Z7901 Long term (current) use of anticoagulants: Secondary | ICD-10-CM | POA: Diagnosis not present

## 2020-02-21 DIAGNOSIS — E1142 Type 2 diabetes mellitus with diabetic polyneuropathy: Secondary | ICD-10-CM | POA: Diagnosis not present

## 2020-02-21 DIAGNOSIS — M810 Age-related osteoporosis without current pathological fracture: Secondary | ICD-10-CM | POA: Diagnosis not present

## 2020-02-21 DIAGNOSIS — Z794 Long term (current) use of insulin: Secondary | ICD-10-CM | POA: Diagnosis not present

## 2020-02-21 DIAGNOSIS — I4891 Unspecified atrial fibrillation: Secondary | ICD-10-CM | POA: Diagnosis not present

## 2020-02-21 DIAGNOSIS — Z86711 Personal history of pulmonary embolism: Secondary | ICD-10-CM | POA: Diagnosis not present

## 2020-02-21 DIAGNOSIS — Z86718 Personal history of other venous thrombosis and embolism: Secondary | ICD-10-CM | POA: Diagnosis not present

## 2020-02-21 DIAGNOSIS — E1165 Type 2 diabetes mellitus with hyperglycemia: Secondary | ICD-10-CM | POA: Diagnosis not present

## 2020-02-23 DIAGNOSIS — Z86718 Personal history of other venous thrombosis and embolism: Secondary | ICD-10-CM | POA: Diagnosis not present

## 2020-02-23 DIAGNOSIS — Z7901 Long term (current) use of anticoagulants: Secondary | ICD-10-CM | POA: Diagnosis not present

## 2020-02-23 DIAGNOSIS — Z794 Long term (current) use of insulin: Secondary | ICD-10-CM | POA: Diagnosis not present

## 2020-02-23 DIAGNOSIS — I4891 Unspecified atrial fibrillation: Secondary | ICD-10-CM | POA: Diagnosis not present

## 2020-02-23 DIAGNOSIS — M47817 Spondylosis without myelopathy or radiculopathy, lumbosacral region: Secondary | ICD-10-CM | POA: Diagnosis not present

## 2020-02-23 DIAGNOSIS — Z8673 Personal history of transient ischemic attack (TIA), and cerebral infarction without residual deficits: Secondary | ICD-10-CM | POA: Diagnosis not present

## 2020-02-23 DIAGNOSIS — I10 Essential (primary) hypertension: Secondary | ICD-10-CM | POA: Diagnosis not present

## 2020-02-23 DIAGNOSIS — M5136 Other intervertebral disc degeneration, lumbar region: Secondary | ICD-10-CM | POA: Diagnosis not present

## 2020-02-23 DIAGNOSIS — E1165 Type 2 diabetes mellitus with hyperglycemia: Secondary | ICD-10-CM | POA: Diagnosis not present

## 2020-02-23 DIAGNOSIS — Z86711 Personal history of pulmonary embolism: Secondary | ICD-10-CM | POA: Diagnosis not present

## 2020-02-23 DIAGNOSIS — E1142 Type 2 diabetes mellitus with diabetic polyneuropathy: Secondary | ICD-10-CM | POA: Diagnosis not present

## 2020-02-23 DIAGNOSIS — M810 Age-related osteoporosis without current pathological fracture: Secondary | ICD-10-CM | POA: Diagnosis not present

## 2020-03-02 DIAGNOSIS — E1165 Type 2 diabetes mellitus with hyperglycemia: Secondary | ICD-10-CM | POA: Diagnosis not present

## 2020-03-02 DIAGNOSIS — Z299 Encounter for prophylactic measures, unspecified: Secondary | ICD-10-CM | POA: Diagnosis not present

## 2020-03-02 DIAGNOSIS — E1142 Type 2 diabetes mellitus with diabetic polyneuropathy: Secondary | ICD-10-CM | POA: Diagnosis not present

## 2020-03-02 DIAGNOSIS — N390 Urinary tract infection, site not specified: Secondary | ICD-10-CM | POA: Diagnosis not present

## 2020-03-02 DIAGNOSIS — I10 Essential (primary) hypertension: Secondary | ICD-10-CM | POA: Diagnosis not present

## 2020-03-09 DIAGNOSIS — M1990 Unspecified osteoarthritis, unspecified site: Secondary | ICD-10-CM | POA: Diagnosis not present

## 2020-03-09 DIAGNOSIS — Z299 Encounter for prophylactic measures, unspecified: Secondary | ICD-10-CM | POA: Diagnosis not present

## 2020-03-10 DIAGNOSIS — E119 Type 2 diabetes mellitus without complications: Secondary | ICD-10-CM | POA: Diagnosis not present

## 2020-03-30 DIAGNOSIS — Z7189 Other specified counseling: Secondary | ICD-10-CM | POA: Diagnosis not present

## 2020-03-30 DIAGNOSIS — I10 Essential (primary) hypertension: Secondary | ICD-10-CM | POA: Diagnosis not present

## 2020-03-30 DIAGNOSIS — Z299 Encounter for prophylactic measures, unspecified: Secondary | ICD-10-CM | POA: Diagnosis not present

## 2020-03-30 DIAGNOSIS — Z0000 Encounter for general adult medical examination without abnormal findings: Secondary | ICD-10-CM | POA: Diagnosis not present

## 2020-04-09 DIAGNOSIS — E119 Type 2 diabetes mellitus without complications: Secondary | ICD-10-CM | POA: Diagnosis not present

## 2020-04-11 DIAGNOSIS — E1142 Type 2 diabetes mellitus with diabetic polyneuropathy: Secondary | ICD-10-CM | POA: Diagnosis not present

## 2020-04-11 DIAGNOSIS — I82401 Acute embolism and thrombosis of unspecified deep veins of right lower extremity: Secondary | ICD-10-CM | POA: Diagnosis not present

## 2020-04-11 DIAGNOSIS — Z299 Encounter for prophylactic measures, unspecified: Secondary | ICD-10-CM | POA: Diagnosis not present

## 2020-04-11 DIAGNOSIS — E1165 Type 2 diabetes mellitus with hyperglycemia: Secondary | ICD-10-CM | POA: Diagnosis not present

## 2020-04-11 DIAGNOSIS — I10 Essential (primary) hypertension: Secondary | ICD-10-CM | POA: Diagnosis not present

## 2020-04-27 DIAGNOSIS — W19XXXA Unspecified fall, initial encounter: Secondary | ICD-10-CM | POA: Diagnosis not present

## 2020-04-27 DIAGNOSIS — E1165 Type 2 diabetes mellitus with hyperglycemia: Secondary | ICD-10-CM | POA: Diagnosis not present

## 2020-04-27 DIAGNOSIS — E1142 Type 2 diabetes mellitus with diabetic polyneuropathy: Secondary | ICD-10-CM | POA: Diagnosis not present

## 2020-04-27 DIAGNOSIS — Z299 Encounter for prophylactic measures, unspecified: Secondary | ICD-10-CM | POA: Diagnosis not present

## 2020-04-27 DIAGNOSIS — I10 Essential (primary) hypertension: Secondary | ICD-10-CM | POA: Diagnosis not present

## 2020-05-10 DIAGNOSIS — E119 Type 2 diabetes mellitus without complications: Secondary | ICD-10-CM | POA: Diagnosis not present

## 2020-05-11 DIAGNOSIS — E119 Type 2 diabetes mellitus without complications: Secondary | ICD-10-CM | POA: Diagnosis not present

## 2020-06-01 DIAGNOSIS — I10 Essential (primary) hypertension: Secondary | ICD-10-CM | POA: Diagnosis not present

## 2020-06-01 DIAGNOSIS — E1165 Type 2 diabetes mellitus with hyperglycemia: Secondary | ICD-10-CM | POA: Diagnosis not present

## 2020-06-01 DIAGNOSIS — E1142 Type 2 diabetes mellitus with diabetic polyneuropathy: Secondary | ICD-10-CM | POA: Diagnosis not present

## 2020-06-01 DIAGNOSIS — Z299 Encounter for prophylactic measures, unspecified: Secondary | ICD-10-CM | POA: Diagnosis not present

## 2020-06-02 DIAGNOSIS — Z79899 Other long term (current) drug therapy: Secondary | ICD-10-CM | POA: Diagnosis not present

## 2020-06-02 DIAGNOSIS — E119 Type 2 diabetes mellitus without complications: Secondary | ICD-10-CM | POA: Diagnosis not present

## 2020-06-02 DIAGNOSIS — Z9049 Acquired absence of other specified parts of digestive tract: Secondary | ICD-10-CM | POA: Diagnosis not present

## 2020-06-02 DIAGNOSIS — Z7984 Long term (current) use of oral hypoglycemic drugs: Secondary | ICD-10-CM | POA: Diagnosis not present

## 2020-06-02 DIAGNOSIS — I959 Hypotension, unspecified: Secondary | ICD-10-CM | POA: Diagnosis not present

## 2020-06-02 DIAGNOSIS — Z8673 Personal history of transient ischemic attack (TIA), and cerebral infarction without residual deficits: Secondary | ICD-10-CM | POA: Diagnosis not present

## 2020-06-02 DIAGNOSIS — K219 Gastro-esophageal reflux disease without esophagitis: Secondary | ICD-10-CM | POA: Diagnosis not present

## 2020-06-02 DIAGNOSIS — R059 Cough, unspecified: Secondary | ICD-10-CM | POA: Diagnosis not present

## 2020-06-02 DIAGNOSIS — Z20822 Contact with and (suspected) exposure to covid-19: Secondary | ICD-10-CM | POA: Diagnosis not present

## 2020-06-02 DIAGNOSIS — N390 Urinary tract infection, site not specified: Secondary | ICD-10-CM | POA: Diagnosis not present

## 2020-06-02 DIAGNOSIS — R531 Weakness: Secondary | ICD-10-CM | POA: Diagnosis not present

## 2020-06-02 DIAGNOSIS — J439 Emphysema, unspecified: Secondary | ICD-10-CM | POA: Diagnosis not present

## 2020-06-02 DIAGNOSIS — Z794 Long term (current) use of insulin: Secondary | ICD-10-CM | POA: Diagnosis not present

## 2020-06-02 DIAGNOSIS — Z7982 Long term (current) use of aspirin: Secondary | ICD-10-CM | POA: Diagnosis not present

## 2020-06-02 DIAGNOSIS — R9431 Abnormal electrocardiogram [ECG] [EKG]: Secondary | ICD-10-CM | POA: Diagnosis not present

## 2020-06-02 DIAGNOSIS — I10 Essential (primary) hypertension: Secondary | ICD-10-CM | POA: Diagnosis not present

## 2020-06-02 DIAGNOSIS — I493 Ventricular premature depolarization: Secondary | ICD-10-CM | POA: Diagnosis not present

## 2020-06-05 DIAGNOSIS — D649 Anemia, unspecified: Secondary | ICD-10-CM | POA: Diagnosis not present

## 2020-06-05 DIAGNOSIS — E1165 Type 2 diabetes mellitus with hyperglycemia: Secondary | ICD-10-CM | POA: Diagnosis not present

## 2020-06-05 DIAGNOSIS — E1142 Type 2 diabetes mellitus with diabetic polyneuropathy: Secondary | ICD-10-CM | POA: Diagnosis not present

## 2020-06-05 DIAGNOSIS — R5383 Other fatigue: Secondary | ICD-10-CM | POA: Diagnosis not present

## 2020-06-05 DIAGNOSIS — E538 Deficiency of other specified B group vitamins: Secondary | ICD-10-CM | POA: Diagnosis not present

## 2020-06-05 DIAGNOSIS — N390 Urinary tract infection, site not specified: Secondary | ICD-10-CM | POA: Diagnosis not present

## 2020-06-05 DIAGNOSIS — I10 Essential (primary) hypertension: Secondary | ICD-10-CM | POA: Diagnosis not present

## 2020-06-05 DIAGNOSIS — Z299 Encounter for prophylactic measures, unspecified: Secondary | ICD-10-CM | POA: Diagnosis not present

## 2020-06-05 DIAGNOSIS — E559 Vitamin D deficiency, unspecified: Secondary | ICD-10-CM | POA: Diagnosis not present

## 2020-06-08 DIAGNOSIS — D6869 Other thrombophilia: Secondary | ICD-10-CM | POA: Diagnosis not present

## 2020-06-08 DIAGNOSIS — Z299 Encounter for prophylactic measures, unspecified: Secondary | ICD-10-CM | POA: Diagnosis not present

## 2020-06-08 DIAGNOSIS — D509 Iron deficiency anemia, unspecified: Secondary | ICD-10-CM | POA: Diagnosis not present

## 2020-06-09 DIAGNOSIS — R262 Difficulty in walking, not elsewhere classified: Secondary | ICD-10-CM | POA: Diagnosis not present

## 2020-06-09 DIAGNOSIS — M5459 Other low back pain: Secondary | ICD-10-CM | POA: Diagnosis not present

## 2020-06-09 DIAGNOSIS — M6281 Muscle weakness (generalized): Secondary | ICD-10-CM | POA: Diagnosis not present

## 2020-06-10 DIAGNOSIS — E119 Type 2 diabetes mellitus without complications: Secondary | ICD-10-CM | POA: Diagnosis not present

## 2020-06-11 DIAGNOSIS — E119 Type 2 diabetes mellitus without complications: Secondary | ICD-10-CM | POA: Diagnosis not present

## 2020-06-12 DIAGNOSIS — M6281 Muscle weakness (generalized): Secondary | ICD-10-CM | POA: Diagnosis not present

## 2020-06-12 DIAGNOSIS — M5459 Other low back pain: Secondary | ICD-10-CM | POA: Diagnosis not present

## 2020-06-12 DIAGNOSIS — R262 Difficulty in walking, not elsewhere classified: Secondary | ICD-10-CM | POA: Diagnosis not present

## 2020-06-14 DIAGNOSIS — M6281 Muscle weakness (generalized): Secondary | ICD-10-CM | POA: Diagnosis not present

## 2020-06-14 DIAGNOSIS — R262 Difficulty in walking, not elsewhere classified: Secondary | ICD-10-CM | POA: Diagnosis not present

## 2020-06-14 DIAGNOSIS — M5459 Other low back pain: Secondary | ICD-10-CM | POA: Diagnosis not present

## 2020-06-19 DIAGNOSIS — Z299 Encounter for prophylactic measures, unspecified: Secondary | ICD-10-CM | POA: Diagnosis not present

## 2020-06-19 DIAGNOSIS — E1165 Type 2 diabetes mellitus with hyperglycemia: Secondary | ICD-10-CM | POA: Diagnosis not present

## 2020-06-19 DIAGNOSIS — R143 Flatulence: Secondary | ICD-10-CM | POA: Diagnosis not present

## 2020-06-19 DIAGNOSIS — M549 Dorsalgia, unspecified: Secondary | ICD-10-CM | POA: Diagnosis not present

## 2020-06-19 DIAGNOSIS — R109 Unspecified abdominal pain: Secondary | ICD-10-CM | POA: Diagnosis not present

## 2020-06-19 DIAGNOSIS — E1142 Type 2 diabetes mellitus with diabetic polyneuropathy: Secondary | ICD-10-CM | POA: Diagnosis not present

## 2020-06-22 DIAGNOSIS — E1142 Type 2 diabetes mellitus with diabetic polyneuropathy: Secondary | ICD-10-CM | POA: Diagnosis not present

## 2020-06-22 DIAGNOSIS — I10 Essential (primary) hypertension: Secondary | ICD-10-CM | POA: Diagnosis not present

## 2020-06-22 DIAGNOSIS — Z299 Encounter for prophylactic measures, unspecified: Secondary | ICD-10-CM | POA: Diagnosis not present

## 2020-06-22 DIAGNOSIS — E1165 Type 2 diabetes mellitus with hyperglycemia: Secondary | ICD-10-CM | POA: Diagnosis not present

## 2020-06-23 DIAGNOSIS — R262 Difficulty in walking, not elsewhere classified: Secondary | ICD-10-CM | POA: Diagnosis not present

## 2020-06-23 DIAGNOSIS — M6281 Muscle weakness (generalized): Secondary | ICD-10-CM | POA: Diagnosis not present

## 2020-06-23 DIAGNOSIS — M5459 Other low back pain: Secondary | ICD-10-CM | POA: Diagnosis not present

## 2020-06-28 DIAGNOSIS — M6281 Muscle weakness (generalized): Secondary | ICD-10-CM | POA: Diagnosis not present

## 2020-06-28 DIAGNOSIS — R262 Difficulty in walking, not elsewhere classified: Secondary | ICD-10-CM | POA: Diagnosis not present

## 2020-06-28 DIAGNOSIS — M5459 Other low back pain: Secondary | ICD-10-CM | POA: Diagnosis not present

## 2020-06-29 DIAGNOSIS — R262 Difficulty in walking, not elsewhere classified: Secondary | ICD-10-CM | POA: Diagnosis not present

## 2020-06-29 DIAGNOSIS — I10 Essential (primary) hypertension: Secondary | ICD-10-CM | POA: Diagnosis not present

## 2020-06-29 DIAGNOSIS — E1165 Type 2 diabetes mellitus with hyperglycemia: Secondary | ICD-10-CM | POA: Diagnosis not present

## 2020-06-29 DIAGNOSIS — Z299 Encounter for prophylactic measures, unspecified: Secondary | ICD-10-CM | POA: Diagnosis not present

## 2020-06-29 DIAGNOSIS — M6281 Muscle weakness (generalized): Secondary | ICD-10-CM | POA: Diagnosis not present

## 2020-06-29 DIAGNOSIS — M5459 Other low back pain: Secondary | ICD-10-CM | POA: Diagnosis not present

## 2020-06-29 DIAGNOSIS — E1142 Type 2 diabetes mellitus with diabetic polyneuropathy: Secondary | ICD-10-CM | POA: Diagnosis not present

## 2020-07-04 DIAGNOSIS — M6281 Muscle weakness (generalized): Secondary | ICD-10-CM | POA: Diagnosis not present

## 2020-07-04 DIAGNOSIS — M5459 Other low back pain: Secondary | ICD-10-CM | POA: Diagnosis not present

## 2020-07-04 DIAGNOSIS — R262 Difficulty in walking, not elsewhere classified: Secondary | ICD-10-CM | POA: Diagnosis not present

## 2020-07-06 DIAGNOSIS — M5459 Other low back pain: Secondary | ICD-10-CM | POA: Diagnosis not present

## 2020-07-06 DIAGNOSIS — M6281 Muscle weakness (generalized): Secondary | ICD-10-CM | POA: Diagnosis not present

## 2020-07-06 DIAGNOSIS — R262 Difficulty in walking, not elsewhere classified: Secondary | ICD-10-CM | POA: Diagnosis not present

## 2020-07-08 DIAGNOSIS — E119 Type 2 diabetes mellitus without complications: Secondary | ICD-10-CM | POA: Diagnosis not present

## 2020-07-13 DIAGNOSIS — M5459 Other low back pain: Secondary | ICD-10-CM | POA: Diagnosis not present

## 2020-07-13 DIAGNOSIS — R262 Difficulty in walking, not elsewhere classified: Secondary | ICD-10-CM | POA: Diagnosis not present

## 2020-07-13 DIAGNOSIS — M6281 Muscle weakness (generalized): Secondary | ICD-10-CM | POA: Diagnosis not present

## 2020-07-15 DIAGNOSIS — M6281 Muscle weakness (generalized): Secondary | ICD-10-CM | POA: Diagnosis not present

## 2020-07-15 DIAGNOSIS — M5459 Other low back pain: Secondary | ICD-10-CM | POA: Diagnosis not present

## 2020-07-15 DIAGNOSIS — R262 Difficulty in walking, not elsewhere classified: Secondary | ICD-10-CM | POA: Diagnosis not present

## 2020-07-18 DIAGNOSIS — M5459 Other low back pain: Secondary | ICD-10-CM | POA: Diagnosis not present

## 2020-07-18 DIAGNOSIS — R262 Difficulty in walking, not elsewhere classified: Secondary | ICD-10-CM | POA: Diagnosis not present

## 2020-07-18 DIAGNOSIS — M6281 Muscle weakness (generalized): Secondary | ICD-10-CM | POA: Diagnosis not present

## 2020-07-19 DIAGNOSIS — R0600 Dyspnea, unspecified: Secondary | ICD-10-CM | POA: Diagnosis not present

## 2020-07-19 DIAGNOSIS — I10 Essential (primary) hypertension: Secondary | ICD-10-CM | POA: Diagnosis not present

## 2020-07-19 DIAGNOSIS — C44622 Squamous cell carcinoma of skin of right upper limb, including shoulder: Secondary | ICD-10-CM | POA: Diagnosis not present

## 2020-07-19 DIAGNOSIS — D485 Neoplasm of uncertain behavior of skin: Secondary | ICD-10-CM | POA: Diagnosis not present

## 2020-07-19 DIAGNOSIS — Z299 Encounter for prophylactic measures, unspecified: Secondary | ICD-10-CM | POA: Diagnosis not present

## 2020-07-21 DIAGNOSIS — M5459 Other low back pain: Secondary | ICD-10-CM | POA: Diagnosis not present

## 2020-07-21 DIAGNOSIS — R262 Difficulty in walking, not elsewhere classified: Secondary | ICD-10-CM | POA: Diagnosis not present

## 2020-07-21 DIAGNOSIS — M6281 Muscle weakness (generalized): Secondary | ICD-10-CM | POA: Diagnosis not present

## 2020-07-25 DIAGNOSIS — M5459 Other low back pain: Secondary | ICD-10-CM | POA: Diagnosis not present

## 2020-07-25 DIAGNOSIS — M6281 Muscle weakness (generalized): Secondary | ICD-10-CM | POA: Diagnosis not present

## 2020-07-25 DIAGNOSIS — R262 Difficulty in walking, not elsewhere classified: Secondary | ICD-10-CM | POA: Diagnosis not present

## 2020-07-27 DIAGNOSIS — M5459 Other low back pain: Secondary | ICD-10-CM | POA: Diagnosis not present

## 2020-07-27 DIAGNOSIS — R262 Difficulty in walking, not elsewhere classified: Secondary | ICD-10-CM | POA: Diagnosis not present

## 2020-07-27 DIAGNOSIS — M6281 Muscle weakness (generalized): Secondary | ICD-10-CM | POA: Diagnosis not present

## 2020-08-01 DIAGNOSIS — M6281 Muscle weakness (generalized): Secondary | ICD-10-CM | POA: Diagnosis not present

## 2020-08-01 DIAGNOSIS — Z299 Encounter for prophylactic measures, unspecified: Secondary | ICD-10-CM | POA: Diagnosis not present

## 2020-08-01 DIAGNOSIS — I2699 Other pulmonary embolism without acute cor pulmonale: Secondary | ICD-10-CM | POA: Diagnosis not present

## 2020-08-01 DIAGNOSIS — M5459 Other low back pain: Secondary | ICD-10-CM | POA: Diagnosis not present

## 2020-08-01 DIAGNOSIS — R262 Difficulty in walking, not elsewhere classified: Secondary | ICD-10-CM | POA: Diagnosis not present

## 2020-08-01 DIAGNOSIS — I10 Essential (primary) hypertension: Secondary | ICD-10-CM | POA: Diagnosis not present

## 2020-08-01 DIAGNOSIS — R600 Localized edema: Secondary | ICD-10-CM | POA: Diagnosis not present

## 2020-08-01 DIAGNOSIS — E1165 Type 2 diabetes mellitus with hyperglycemia: Secondary | ICD-10-CM | POA: Diagnosis not present

## 2020-08-02 DIAGNOSIS — R262 Difficulty in walking, not elsewhere classified: Secondary | ICD-10-CM | POA: Diagnosis not present

## 2020-08-02 DIAGNOSIS — M5459 Other low back pain: Secondary | ICD-10-CM | POA: Diagnosis not present

## 2020-08-02 DIAGNOSIS — M6281 Muscle weakness (generalized): Secondary | ICD-10-CM | POA: Diagnosis not present

## 2020-08-07 DIAGNOSIS — R0602 Shortness of breath: Secondary | ICD-10-CM | POA: Diagnosis not present

## 2020-08-09 DIAGNOSIS — M5459 Other low back pain: Secondary | ICD-10-CM | POA: Diagnosis not present

## 2020-08-09 DIAGNOSIS — M6281 Muscle weakness (generalized): Secondary | ICD-10-CM | POA: Diagnosis not present

## 2020-08-09 DIAGNOSIS — R262 Difficulty in walking, not elsewhere classified: Secondary | ICD-10-CM | POA: Diagnosis not present

## 2020-08-12 DIAGNOSIS — M5459 Other low back pain: Secondary | ICD-10-CM | POA: Diagnosis not present

## 2020-08-12 DIAGNOSIS — M6281 Muscle weakness (generalized): Secondary | ICD-10-CM | POA: Diagnosis not present

## 2020-08-12 DIAGNOSIS — R262 Difficulty in walking, not elsewhere classified: Secondary | ICD-10-CM | POA: Diagnosis not present

## 2020-09-05 DIAGNOSIS — M545 Low back pain, unspecified: Secondary | ICD-10-CM | POA: Diagnosis not present

## 2020-09-05 DIAGNOSIS — G894 Chronic pain syndrome: Secondary | ICD-10-CM | POA: Diagnosis not present

## 2020-09-05 DIAGNOSIS — Z79891 Long term (current) use of opiate analgesic: Secondary | ICD-10-CM | POA: Diagnosis not present

## 2020-09-05 DIAGNOSIS — Z79899 Other long term (current) drug therapy: Secondary | ICD-10-CM | POA: Diagnosis not present

## 2020-09-11 ENCOUNTER — Other Ambulatory Visit: Payer: Self-pay

## 2020-09-11 ENCOUNTER — Ambulatory Visit (INDEPENDENT_AMBULATORY_CARE_PROVIDER_SITE_OTHER): Payer: Medicare Other | Admitting: Gastroenterology

## 2020-09-11 ENCOUNTER — Encounter (INDEPENDENT_AMBULATORY_CARE_PROVIDER_SITE_OTHER): Payer: Self-pay | Admitting: Gastroenterology

## 2020-09-11 ENCOUNTER — Telehealth (INDEPENDENT_AMBULATORY_CARE_PROVIDER_SITE_OTHER): Payer: Medicare Other | Admitting: Gastroenterology

## 2020-09-11 DIAGNOSIS — D509 Iron deficiency anemia, unspecified: Secondary | ICD-10-CM

## 2020-09-11 DIAGNOSIS — K219 Gastro-esophageal reflux disease without esophagitis: Secondary | ICD-10-CM | POA: Diagnosis not present

## 2020-09-11 NOTE — Progress Notes (Signed)
Maylon Peppers, M.D. Gastroenterology & Hepatology [facility] [address] [place], [place] [address] Primary Care Physician: Glenda Chroman, MD [address] [place] [address]  Referring MD: PCP  This is a telephone virtual visit.  It required patient-provider interaction for the medical decision making as documented below. The patient has consented and agreed to proceed with a Telehealth encounter given the current Coronavirus pandemic.  VIRTUAL VISIT NOTE Patient location: Assisted living facility Provider location: Home  I will communicate my assessment and recommendations to the referring MD via EMR.  Chief Complaint: heartburn and iron deficiency anemia  History of Present Illness: Sandra [name] is a 80 y.o. female with PMH PE and DVT s/p Eliquis, stroke, afib, diabetes, depression, and anxiety, who presents for evaluation of heartburn and iron deficiency anemia.  Patient reports she has had intermittent episodes of heartburn for the last year, on average 3-4 times per week. She reports that food triggers her symptoms.  She reports having recurrent episodes of regurgitation of acid to her throat as well.  The patient is taking omeprazole 40 mg and Pepcid 20 mg  in AM. She is taking them in the AM but she takes it immediately before breakfast.  She denies any odynophagia or dysphagia. Patient reports having bloating several times a week, but no abdominal pain.  States that a couple months ago she was diagnosed with iron deficiency anemia and she was prescribed iron supplementation. She reported having some melena before starting her iron pills, states that these changes have persisted since then.  The patient denies having any nausea, vomiting, fever, chills, hematochezia,  hematemesis, diarrhea, jaundice, pruritus or weight loss.  Last EGD: never Last Colonoscopy: 2014 - 15 mm polyp snared from cecum and 2 hemoclips applied to  polypectomy site to prevent risk of bleeding in a patient who needs to be back on Xarelto. 5 mm polyp cold snared from ascending colon. External hemorrhoids with prominent anal papillae. Recurrent hematochezia felt to be secondary to hemorrhoids.  FHx: neg for any gastrointestinal/liver disease, mother bladder cancer Social: neg smoking, alcohol or illicit drug use Surgical: hysterectomy, cholecystectomy  Past Medical History: Past Medical History:  Diagnosis Date  . Anxiety   . Arthritis    "all over; especially in her hands" (01/26/2018)  . Atrial fibrillation ([place])   . Depression   . DVT (deep venous thrombosis), left 04/2011; 2017   after an ankle fracture.: "don't know what caused the one in 2017"  . Hypertension   . Migraine    "more often here lately" (01/26/2018)  . Mini stroke ([place]) 2018   "a series of mini strokes" (01/26/2018)  . Mitral regurgitation 07/2011   moderate  . Pulmonary embolism ([place]) 04/2011  . Type II diabetes mellitus ([place])     Past Surgical History: Past Surgical History:  Procedure Laterality Date  . ABDOMINAL HYSTERECTOMY    . BUNIONECTOMY    . CATARACT EXTRACTION Bilateral   . COLONOSCOPY  05/07/2012   Procedure: COLONOSCOPY;  Surgeon: Rogene Houston, MD;  [facility];  Service: Endoscopy;  Laterality: N/A;  245  . HERNIA REPAIR    . TONSILLECTOMY      Family History: Family History  Problem Relation Age of Onset  . Hypertension Mother   . Diabetes Mother   . Heart attack Mother     Social History: Social History   Tobacco Use  Smoking Status Never Smoker  Smokeless Tobacco Never  Used   Social History   Substance and Sexual Activity  Alcohol Use Never   Social History   Substance and Sexual Activity  Drug Use Never    Allergies: Allergies  Allergen Reactions  . Buprenex [Buprenorphine] Nausea And Vomiting  . Namenda [Memantine Hcl] Other (See Comments)    Sores in the mouth  . Other Other (See Comments)     Shot for pain caused severe nausea. Had to spend the night in the hospital. Shot to numb mouth at the dentist = made her feel like she was smothering.     Medications: Current Outpatient Medications  Medication Sig Dispense Refill  . acetaminophen (TYLENOL) 650 MG CR tablet Take 650 mg by mouth every 4 (four) hours as needed for pain.    Marland Kitchen amLODipine (NORVASC) 10 MG tablet Take 5 mg by mouth daily.    . ASPIRIN 81 PO Take by mouth.    Marland Kitchen atorvastatin (LIPITOR) 80 MG tablet Take 80 mg by mouth daily.    Haze Justin Probiotic (BIOGAIA/GERBER SOOTHE) LIQD Take 5 drops by mouth daily at 8 pm. Algin daily    . calcium carbonate (TUMS - DOSED IN MG ELEMENTAL CALCIUM) 500 MG chewable tablet Chew 1 tablet by mouth as needed for indigestion or heartburn.    . chlorthalidone (HYGROTON) 25 MG tablet Take 25 mg by mouth daily.    . cholestyramine (QUESTRAN) 4 g packet Take 4 g by mouth daily.    . diphenoxylate-atropine (LOMOTIL) 2.5-0.025 MG tablet Take by mouth as needed for diarrhea or loose stools.    . famotidine (PEPCID) 20 MG tablet Take 20 mg by mouth daily.    . ferrous sulfate 325 (65 FE) MG tablet Take 325 mg by mouth daily with breakfast.    . insulin glargine (LANTUS) 100 UNIT/ML injection Inject 30 Units into the skin at bedtime.    . insulin lispro (HUMALOG KWIKPEN) 100 UNIT/ML KiwkPen Inject 0.16-0.2 mLs (16-20 Units total) into the skin 3 (three) times daily. (Patient taking differently: Inject 8 Units into the skin. Prior to supper) 45 mL 3  . Melatonin-Pyridoxine (MELATIN PO) Take 3 mg by mouth at bedtime.    . metFORMIN (GLUCOPHAGE) 500 MG tablet TAKE 1 TABLET BY MOUTH  TWICE A DAY (Patient taking differently: Take 1,000 mg by mouth daily at 6 (six) AM. Take 2 tablets (1000 mg) by mouth every morning and 1 tablet (500 mg) at night) 180 tablet 1  . metoprolol succinate (TOPROL-XL) 50 MG 24 hr tablet Take 1 tablet (50 mg total) by mouth 2 (two) times daily. (Patient taking differently:  Take 50 mg by mouth daily.) 60 tablet 0  . nystatin (MYCOSTATIN) 100000 UNIT/ML suspension Every 24 hours prn.    . omeprazole (PRILOSEC) 40 MG capsule Take 40 mg by mouth daily.    . ondansetron (ZOFRAN) 4 MG tablet Take 4 mg by mouth every 8 (eight) hours as needed for nausea or vomiting.    Marland Kitchen oxybutynin (DITROPAN) 5 MG tablet Take 5 mg by mouth daily.    Marland Kitchen oxyCODONE-acetaminophen (PERCOCET/ROXICET) 5-325 MG tablet Take 1 tablet by mouth every 8 (eight) hours as needed for severe pain. Q 12 hours prn.    . psyllium (METAMUCIL) 58.6 % packet Take 1 packet by mouth as needed.    . rivaroxaban (XARELTO) 20 MG TABS tablet Take 20 mg by mouth at bedtime.    . Semaglutide,0.25 or 0.5MG /DOS, (OZEMPIC, 0.25 OR 0.5 MG/DOSE,) 2 MG/1.5ML SOPN Inject 2 mg  into the skin once a week. Thursday    . sertraline (ZOLOFT) 50 MG tablet Take 50 mg by mouth at bedtime.    . traMADol (ULTRAM) 50 MG tablet Take 1 tablet (50 mg total) by mouth every 6 (six) hours as needed for moderate pain or severe pain. (Patient taking differently: Take 50 mg by mouth every 12 (twelve) hours as needed for moderate pain or severe pain.) 5 tablet 0  . pantoprazole (PROTONIX) 40 MG tablet TAKE ONE TABLET DAILY (Patient not taking: Reported on 09/11/2020) 30 tablet 3  . polyethylene glycol (MIRALAX / GLYCOLAX) packet Take 17 g by mouth daily. (Patient not taking: Reported on 09/11/2020) 14 each 0  . senna-docusate (SENOKOT-S) 8.6-50 MG tablet Take 1 tablet by mouth at bedtime. (Patient not taking: Reported on 09/11/2020) 14 tablet 0   No current facility-administered medications for this visit.    Review of Systems: GENERAL: negative for malaise, night sweats HEENT: No changes in hearing or vision, no nose bleeds or other nasal problems. NECK: Negative for lumps, goiter, pain and significant neck swelling RESPIRATORY: Negative for cough, wheezing CARDIOVASCULAR: Negative for chest pain, leg swelling, palpitations, orthopnea GI: SEE  HPI MUSCULOSKELETAL: Negative for joint pain or swelling, back pain, and muscle pain. SKIN: Negative for lesions, rash PSYCH: Negative for sleep disturbance, mood disorder and recent psychosocial stressors. HEMATOLOGY Negative for prolonged bleeding, bruising easily, and swollen nodes. ENDOCRINE: Negative for cold or heat intolerance, polyuria, polydipsia and goiter. NEURO: negative for tremor, gait imbalance, syncope and seizures. The remainder of the review of systems is noncontributory.   Physical Exam: No exam was performed as this was a telephone encounter  Imaging/Labs: as above  I personally reviewed and interpreted the available labs, imaging and endoscopic files.  Impression and Plan: Sandra [name] is a 80 y.o. female with PMH PE and DVT s/p Eliquis, stroke, afib, diabetes, depression, and anxiety, who presents for evaluation of heartburn and iron deficiency anemia.  In terms of her recurrent episodes of heartburn, this seems to be related to longstanding GERD.  She has been taking her medications compliantly but I explained to her that the right way to do it despite waiting 30 minutes to her meal, she will implement these changes and will continue taking the omeprazole 40 mg every day and Pepcid daily.  Nevertheless, we will explore her heartburn symptoms further, I will ask her iron-deficiency anemia with an EGD.  Colonoscopy will also be performed for investigation of her IDA.  Patient understood and agreed.  -Schedule EGD and colonoscopy - will need to reach Dr. Woody Seller to obtain clearance to hold Xarelto 2 days prior -Check CBC, iron studies -Continue oral iron supplementation -Continue omeprazole 40 mg qday -Continue Pepcid daily -Explained presumed etiology of reflux symptoms. Instruction provided in the use of antireflux medication - patient should take medication in the morning 30-45 minutes before eating breakfast. Discussed avoidance of eating within 2 hours of lying down  to sleep and benefit of blocks to elevate head of bed.  All questions were answered.      Total visit time: I spent a total of 45 minutes  Maylon Peppers, MD Gastroenterology and Hepatology [facility]

## 2020-09-11 NOTE — Patient Instructions (Signed)
Schedule EGD and colonoscopy - will need to reach Dr. Woody Seller to obtain clearance to hold Xarelto 2 days prior Perform blood workup Continue oral iron Continue ommprazole 40 mg qday Continue Pepcid daily Explained presumed etiology of reflux symptoms. Instruction provided in the use of antireflux medication - patient should take medication in the morning 30-45 minutes before eating breakfast. Discussed avoidance of eating within 2 hours of lying down to sleep and benefit of blocks to elevate head of bed.

## 2020-09-13 ENCOUNTER — Other Ambulatory Visit (INDEPENDENT_AMBULATORY_CARE_PROVIDER_SITE_OTHER): Payer: Self-pay

## 2020-09-13 ENCOUNTER — Encounter (INDEPENDENT_AMBULATORY_CARE_PROVIDER_SITE_OTHER): Payer: Self-pay

## 2020-09-13 ENCOUNTER — Telehealth (INDEPENDENT_AMBULATORY_CARE_PROVIDER_SITE_OTHER): Payer: Self-pay

## 2020-09-13 DIAGNOSIS — D649 Anemia, unspecified: Secondary | ICD-10-CM | POA: Diagnosis not present

## 2020-09-13 MED ORDER — PEG 3350-KCL-NA BICARB-NACL 420 G PO SOLR: 4000.0000 mL | ORAL | 0 refills | Status: DC

## 2020-09-13 NOTE — Telephone Encounter (Signed)
LeighAnn Danetra Glock, CMA  

## 2020-09-14 DIAGNOSIS — R5383 Other fatigue: Secondary | ICD-10-CM | POA: Diagnosis not present

## 2020-09-14 DIAGNOSIS — Z299 Encounter for prophylactic measures, unspecified: Secondary | ICD-10-CM | POA: Diagnosis not present

## 2020-09-14 DIAGNOSIS — I10 Essential (primary) hypertension: Secondary | ICD-10-CM | POA: Diagnosis not present

## 2020-09-20 DIAGNOSIS — W19XXXA Unspecified fall, initial encounter: Secondary | ICD-10-CM | POA: Diagnosis not present

## 2020-09-20 DIAGNOSIS — R2242 Localized swelling, mass and lump, left lower limb: Secondary | ICD-10-CM | POA: Diagnosis not present

## 2020-09-21 DIAGNOSIS — I739 Peripheral vascular disease, unspecified: Secondary | ICD-10-CM | POA: Diagnosis not present

## 2020-09-21 DIAGNOSIS — I10 Essential (primary) hypertension: Secondary | ICD-10-CM | POA: Diagnosis not present

## 2020-09-21 DIAGNOSIS — I509 Heart failure, unspecified: Secondary | ICD-10-CM | POA: Diagnosis not present

## 2020-09-21 DIAGNOSIS — Z299 Encounter for prophylactic measures, unspecified: Secondary | ICD-10-CM | POA: Diagnosis not present

## 2020-10-01 DIAGNOSIS — E119 Type 2 diabetes mellitus without complications: Secondary | ICD-10-CM | POA: Diagnosis not present

## 2020-10-02 ENCOUNTER — Telehealth (INDEPENDENT_AMBULATORY_CARE_PROVIDER_SITE_OTHER): Payer: Self-pay | Admitting: Gastroenterology

## 2020-10-02 NOTE — Telephone Encounter (Signed)
Patient called the office stated she wants Dr Laural Golden to do her colonoscopy - please advise - ph# [phone]

## 2020-10-03 ENCOUNTER — Other Ambulatory Visit (INDEPENDENT_AMBULATORY_CARE_PROVIDER_SITE_OTHER): Payer: Self-pay

## 2020-10-03 DIAGNOSIS — M25572 Pain in left ankle and joints of left foot: Secondary | ICD-10-CM | POA: Diagnosis not present

## 2020-10-03 DIAGNOSIS — W19XXXA Unspecified fall, initial encounter: Secondary | ICD-10-CM | POA: Diagnosis not present

## 2020-10-03 NOTE — Telephone Encounter (Signed)
Spoke with the patient, she was calling back regarding the previous call to schedule her procedure.  She has the information regarding the day of her EGD and colonoscopy.

## 2020-10-03 NOTE — Telephone Encounter (Signed)
Noted. Per Serena Colonel she has addressed it.

## 2020-10-03 NOTE — Telephone Encounter (Signed)
I spoke to [place] in Regards to Ms Weikel

## 2020-10-03 NOTE — Telephone Encounter (Signed)
Patient returned Dr Colman Cater phone call - ph# [phone]

## 2020-10-05 DIAGNOSIS — I739 Peripheral vascular disease, unspecified: Secondary | ICD-10-CM | POA: Diagnosis not present

## 2020-10-05 DIAGNOSIS — M25572 Pain in left ankle and joints of left foot: Secondary | ICD-10-CM | POA: Diagnosis not present

## 2020-10-05 DIAGNOSIS — I10 Essential (primary) hypertension: Secondary | ICD-10-CM | POA: Diagnosis not present

## 2020-10-05 DIAGNOSIS — Z299 Encounter for prophylactic measures, unspecified: Secondary | ICD-10-CM | POA: Diagnosis not present

## 2020-10-09 ENCOUNTER — Ambulatory Visit (INDEPENDENT_AMBULATORY_CARE_PROVIDER_SITE_OTHER): Payer: Medicare Other | Admitting: Cardiology

## 2020-10-09 ENCOUNTER — Other Ambulatory Visit: Payer: Self-pay

## 2020-10-09 ENCOUNTER — Encounter: Payer: Self-pay | Admitting: Cardiology

## 2020-10-09 VITALS — BP 125/55 | HR 72 | Ht 63.0 in | Wt 164.8 lb

## 2020-10-09 DIAGNOSIS — R600 Localized edema: Secondary | ICD-10-CM

## 2020-10-09 DIAGNOSIS — Z79899 Other long term (current) drug therapy: Secondary | ICD-10-CM

## 2020-10-09 DIAGNOSIS — R0602 Shortness of breath: Secondary | ICD-10-CM

## 2020-10-09 MED ORDER — METOPROLOL SUCCINATE ER 50 MG PO TB24: 75.0000 mg | ORAL_TABLET | Freq: Two times a day (BID) | ORAL | 3 refills | Status: DC

## 2020-10-09 MED ORDER — FUROSEMIDE 40 MG PO TABS: 40.0000 mg | ORAL_TABLET | Freq: Every day | ORAL | 3 refills | Status: DC

## 2020-10-09 NOTE — Patient Instructions (Signed)
Medication Instructions:  STOP Chlorthalidone   START Lasix 40 mg daily   INCREASE Toprol XL to 75 mg Twice a day   *If you need a refill on your cardiac medications before your next appointment, please call your pharmacy*   Lab Work: BMET,magnesium, TSH in 2 weeks  If you have labs (blood work) drawn today and your tests are completely normal, you will receive your results only by: [place] (if you have MyChart) OR A paper copy in the mail If you have any lab test that is abnormal or we need to change your treatment, we will call you to review the results.   Testing/Procedures: None today    Follow-Up: At [facility], you and your health needs are our priority.  As part of our continuing mission to provide you with exceptional heart care, we have created designated Provider Care Teams.  These Care Teams include your primary Cardiologist (physician) and Advanced Practice Providers (APPs -  Physician Assistants and Nurse Practitioners) who all work together to provide you with the care you need, when you need it.  We recommend signing up for the patient portal called "MyChart".  Sign up information is provided on this After Visit Summary.  MyChart is used to connect with patients for Virtual Visits (Telemedicine).  Patients are able to view lab/test results, encounter notes, upcoming appointments, etc.  Non-urgent messages can be sent to your provider as well.   To learn more about what you can do with MyChart, go to [email or web address].    Your next appointment:   6 week(s)  The format for your next appointment:   In Person  Provider:   Katina Dung, NP   Other Instructions None

## 2020-10-09 NOTE — Progress Notes (Signed)
Clinical Summary Sandra [name] is a 80 y.o.female seen today as a new consult referred Dr Woody Seller for the following medical problems. Previous patient of Dr Fletcher Anon last seen in 2018   SOB/LE edema - recent LE edema, L>R.  - has had some recent SOB/DOE, that progressing -  07/2014 echo LVEF 60-65%, grade I dd,   2. Palpitations - from notes has had PVCs and short runs of PSVT - reports some recent symptoms - compliant with toprol   3. Mitral regurgitation - mild to moderate by 2020 echo at [place]    4. History of PE - on anticoag, she is on xarelto Past Medical History:  Diagnosis Date   Anxiety    Arthritis    "all over; especially in her hands" (01/26/2018)   Atrial fibrillation ([place])    Depression    DVT (deep venous thrombosis), left 04/2011; 2017   after an ankle fracture.: "don't know what caused the one in 2017"   Hypertension    Migraine    "more often here lately" (01/26/2018)   Mini stroke ([place]) 2018   "a series of mini strokes" (01/26/2018)   Mitral regurgitation 07/2011   moderate   Pulmonary embolism ([place]) 04/2011   Type II diabetes mellitus ([facility])      Allergies  Allergen Reactions   Buprenex [Buprenorphine] Nausea And Vomiting   Namenda [Memantine Hcl] Other (See Comments)    Sores in the mouth   Other Other (See Comments)    Shot for pain caused severe nausea. Had to spend the night in the hospital. Shot to numb mouth at the dentist = made her feel like she was smothering.      Current Outpatient Medications  Medication Sig Dispense Refill   acetaminophen (TYLENOL) 650 MG CR tablet Take 650 mg by mouth every 4 (four) hours as needed for pain.     amLODipine (NORVASC) 10 MG tablet Take 5 mg by mouth daily.     ASPIRIN 81 PO Take by mouth.     atorvastatin (LIPITOR) 80 MG tablet Take 80 mg by mouth daily.     BioGaia Probiotic (BIOGAIA/GERBER SOOTHE) LIQD Take 5 drops by mouth daily at 8 pm. Algin daily     calcium carbonate (TUMS - DOSED IN MG  ELEMENTAL CALCIUM) 500 MG chewable tablet Chew 1 tablet by mouth as needed for indigestion or heartburn.     chlorthalidone (HYGROTON) 25 MG tablet Take 25 mg by mouth daily.     cholestyramine (QUESTRAN) 4 g packet Take 4 g by mouth daily.     diphenoxylate-atropine (LOMOTIL) 2.5-0.025 MG tablet Take by mouth as needed for diarrhea or loose stools.     famotidine (PEPCID) 20 MG tablet Take 20 mg by mouth daily.     ferrous sulfate 325 (65 FE) MG tablet Take 325 mg by mouth daily with breakfast.     insulin glargine (LANTUS) 100 UNIT/ML injection Inject 30 Units into the skin at bedtime.     insulin lispro (HUMALOG KWIKPEN) 100 UNIT/ML KiwkPen Inject 0.16-0.2 mLs (16-20 Units total) into the skin 3 (three) times daily. (Patient taking differently: Inject 8 Units into the skin. Prior to supper) 45 mL 3   Melatonin-Pyridoxine (MELATIN PO) Take 3 mg by mouth at bedtime.     metFORMIN (GLUCOPHAGE) 500 MG tablet TAKE 1 TABLET BY MOUTH  TWICE A DAY (Patient taking differently: Take 1,000 mg by mouth daily at 6 (six) AM. Take 2 tablets (1000 mg) by  mouth every morning and 1 tablet (500 mg) at night) 180 tablet 1   metoprolol succinate (TOPROL-XL) 50 MG 24 hr tablet Take 1 tablet (50 mg total) by mouth 2 (two) times daily. (Patient taking differently: Take 50 mg by mouth daily.) 60 tablet 0   nystatin (MYCOSTATIN) 100000 UNIT/ML suspension Every 24 hours prn.     omeprazole (PRILOSEC) 40 MG capsule Take 40 mg by mouth daily.     ondansetron (ZOFRAN) 4 MG tablet Take 4 mg by mouth every 8 (eight) hours as needed for nausea or vomiting.     oxybutynin (DITROPAN) 5 MG tablet Take 5 mg by mouth daily.     oxyCODONE-acetaminophen (PERCOCET/ROXICET) 5-325 MG tablet Take 1 tablet by mouth every 8 (eight) hours as needed for severe pain. Q 12 hours prn.     polyethylene glycol (MIRALAX / GLYCOLAX) packet Take 17 g by mouth daily. (Patient not taking: Reported on 09/11/2020) 14 each 0   polyethylene  glycol-electrolytes (TRILYTE) 420 g solution Take 4,000 mLs by mouth as directed. 4000 mL 0   psyllium (METAMUCIL) 58.6 % packet Take 1 packet by mouth as needed.     rivaroxaban (XARELTO) 20 MG TABS tablet Take 20 mg by mouth at bedtime.     Semaglutide,0.25 or 0.5MG /DOS, (OZEMPIC, 0.25 OR 0.5 MG/DOSE,) 2 MG/1.5ML SOPN Inject 2 mg into the skin once a week. Thursday     senna-docusate (SENOKOT-S) 8.6-50 MG tablet Take 1 tablet by mouth at bedtime. (Patient not taking: Reported on 09/11/2020) 14 tablet 0   sertraline (ZOLOFT) 50 MG tablet Take 50 mg by mouth at bedtime.     traMADol (ULTRAM) 50 MG tablet Take 1 tablet (50 mg total) by mouth every 6 (six) hours as needed for moderate pain or severe pain. (Patient taking differently: Take 50 mg by mouth every 12 (twelve) hours as needed for moderate pain or severe pain.) 5 tablet 0   No current facility-administered medications for this visit.     Past Surgical History:  Procedure Laterality Date   ABDOMINAL HYSTERECTOMY     BUNIONECTOMY     CATARACT EXTRACTION Bilateral    COLONOSCOPY  05/07/2012   Procedure: COLONOSCOPY;  Surgeon: Rogene Houston, MD;  [facility];  Service: Endoscopy;  Laterality: N/A;  245   HERNIA REPAIR     TONSILLECTOMY       Allergies  Allergen Reactions   Buprenex [Buprenorphine] Nausea And Vomiting   Namenda [Memantine Hcl] Other (See Comments)    Sores in the mouth   Other Other (See Comments)    Shot for pain caused severe nausea. Had to spend the night in the hospital. Shot to numb mouth at the dentist = made her feel like she was smothering.       Family History  Problem Relation Age of Onset   Hypertension Mother    Diabetes Mother    Heart attack Mother      Social History Ms. Stipes reports that she has never smoked. She has never used smokeless tobacco. Ms. Homesley reports no history of alcohol use.   Review of Systems CONSTITUTIONAL: No weight loss, fever, chills, weakness or  fatigue.  HEENT: Eyes: No visual loss, blurred vision, double vision or yellow sclerae.No hearing loss, sneezing, congestion, runny nose or sore throat.  SKIN: No rash or itching.  CARDIOVASCULAR: per hpi RESPIRATORY: per hpi GASTROINTESTINAL: No anorexia, nausea, vomiting or diarrhea. No abdominal pain or blood.  GENITOURINARY: No burning on urination,  no polyuria NEUROLOGICAL: No headache, dizziness, syncope, paralysis, ataxia, numbness or tingling in the extremities. No change in bowel or bladder control.  MUSCULOSKELETAL: No muscle, back pain, joint pain or stiffness.  LYMPHATICS: No enlarged nodes. No history of splenectomy.  PSYCHIATRIC: No history of depression or anxiety.  ENDOCRINOLOGIC: No reports of sweating, cold or heat intolerance. No polyuria or polydipsia.  Marland Kitchen   Physical Examination Today's Vitals   10/09/20 1019  BP: (!) 125/55  Pulse: 72  SpO2: 96%  Weight: 164 lb 12.8 oz (74.8 kg)  Height: 5\' 3"  (1.6 m)   Body mass index is 29.19 kg/m.  Gen: resting comfortably, no acute distress HEENT: no scleral icterus, pupils equal round and reactive, no palptable cervical adenopathy,  CV: RRR, extra beat at times. No m/r/g, no jvd Resp: Clear to auscultation bilaterally GI: abdomen is soft, non-tender, non-distended, normal bowel sounds, no hepatosplenomegaly MSK: extremities are warm, no edema.  Skin: warm, no rash Neuro:  no focal deficits Psych: appropriate affect     Assessment and Plan  1.DOE/LE edema - d/c chlorthalidone, start lasix 40mg  daily. Check bmet/mg/tsh in 2 weeks - she thinkgs she may have had a recent echo at pcp, will call. If not will order - if noncardaic edema could be secodnary to prior DVTs, leg injuries leading to venous insufficiency  2. Palpitations - long history with benign ectopy on prior monitors - EKG today shows SR, PVCs - recent symptoms, increase toprol to 75mg  bid.     Addendum: after appointment obtained echo from pcp  dated 4/18/20122 LVEF 45%, abnormal diastolicfunction based on E/A ratio would be grade I. Mild to mod MR, , mild to mod TR. We had already planned to increase her toprol this visit, may titrate further at f/u given mild LV dysfunction. Pending labs at f/u consider ARB, perhaps swapping for her norvasc. Would repeat echo once medically optimized to reeval LVEF   Zandra Abts MD   Arnoldo Lenis, M.D.

## 2020-10-16 ENCOUNTER — Other Ambulatory Visit (INDEPENDENT_AMBULATORY_CARE_PROVIDER_SITE_OTHER): Payer: Self-pay

## 2020-10-18 ENCOUNTER — Encounter (INDEPENDENT_AMBULATORY_CARE_PROVIDER_SITE_OTHER): Payer: Self-pay

## 2020-10-23 ENCOUNTER — Other Ambulatory Visit: Payer: Self-pay | Admitting: Cardiology

## 2020-10-31 DIAGNOSIS — E119 Type 2 diabetes mellitus without complications: Secondary | ICD-10-CM | POA: Diagnosis not present

## 2020-11-02 DIAGNOSIS — I10 Essential (primary) hypertension: Secondary | ICD-10-CM | POA: Diagnosis not present

## 2020-11-02 DIAGNOSIS — Z299 Encounter for prophylactic measures, unspecified: Secondary | ICD-10-CM | POA: Diagnosis not present

## 2020-11-02 DIAGNOSIS — E1165 Type 2 diabetes mellitus with hyperglycemia: Secondary | ICD-10-CM | POA: Diagnosis not present

## 2020-11-02 DIAGNOSIS — I509 Heart failure, unspecified: Secondary | ICD-10-CM | POA: Diagnosis not present

## 2020-11-02 DIAGNOSIS — I739 Peripheral vascular disease, unspecified: Secondary | ICD-10-CM | POA: Diagnosis not present

## 2020-11-15 ENCOUNTER — Ambulatory Visit (HOSPITAL_COMMUNITY)
Admission: RE | Admit: 2020-11-15 | Discharge: 2020-11-15 | Disposition: A | Discharge status: Home / Self Care | Payer: Medicare Other | Source: Ambulatory Visit | Attending: Gastroenterology | Admitting: Gastroenterology

## 2020-11-15 ENCOUNTER — Encounter (HOSPITAL_COMMUNITY): Payer: Self-pay | Admitting: Gastroenterology

## 2020-11-15 ENCOUNTER — Ambulatory Visit (HOSPITAL_COMMUNITY): Payer: Medicare Other | Admitting: Anesthesiology

## 2020-11-15 ENCOUNTER — Other Ambulatory Visit: Payer: Self-pay

## 2020-11-15 ENCOUNTER — Encounter (HOSPITAL_COMMUNITY)
Admission: RE | Disposition: A | Discharge status: Home / Self Care | Payer: Self-pay | Source: Ambulatory Visit | Attending: Gastroenterology

## 2020-11-15 DIAGNOSIS — Z7982 Long term (current) use of aspirin: Secondary | ICD-10-CM | POA: Insufficient documentation

## 2020-11-15 DIAGNOSIS — R12 Heartburn: Secondary | ICD-10-CM | POA: Diagnosis not present

## 2020-11-15 DIAGNOSIS — Z8673 Personal history of transient ischemic attack (TIA), and cerebral infarction without residual deficits: Secondary | ICD-10-CM | POA: Insufficient documentation

## 2020-11-15 DIAGNOSIS — E119 Type 2 diabetes mellitus without complications: Secondary | ICD-10-CM | POA: Diagnosis not present

## 2020-11-15 DIAGNOSIS — Z7984 Long term (current) use of oral hypoglycemic drugs: Secondary | ICD-10-CM | POA: Insufficient documentation

## 2020-11-15 DIAGNOSIS — D123 Benign neoplasm of transverse colon: Secondary | ICD-10-CM

## 2020-11-15 DIAGNOSIS — Z794 Long term (current) use of insulin: Secondary | ICD-10-CM | POA: Insufficient documentation

## 2020-11-15 DIAGNOSIS — Z86711 Personal history of pulmonary embolism: Secondary | ICD-10-CM | POA: Insufficient documentation

## 2020-11-15 DIAGNOSIS — Z888 Allergy status to other drugs, medicaments and biological substances status: Secondary | ICD-10-CM | POA: Insufficient documentation

## 2020-11-15 DIAGNOSIS — D72820 Lymphocytosis (symptomatic): Secondary | ICD-10-CM | POA: Insufficient documentation

## 2020-11-15 DIAGNOSIS — K449 Diaphragmatic hernia without obstruction or gangrene: Secondary | ICD-10-CM | POA: Insufficient documentation

## 2020-11-15 DIAGNOSIS — Z885 Allergy status to narcotic agent status: Secondary | ICD-10-CM | POA: Diagnosis not present

## 2020-11-15 DIAGNOSIS — K317 Polyp of stomach and duodenum: Secondary | ICD-10-CM

## 2020-11-15 DIAGNOSIS — K6389 Other specified diseases of intestine: Secondary | ICD-10-CM | POA: Diagnosis not present

## 2020-11-15 DIAGNOSIS — Z79899 Other long term (current) drug therapy: Secondary | ICD-10-CM | POA: Insufficient documentation

## 2020-11-15 DIAGNOSIS — K648 Other hemorrhoids: Secondary | ICD-10-CM | POA: Insufficient documentation

## 2020-11-15 DIAGNOSIS — D122 Benign neoplasm of ascending colon: Secondary | ICD-10-CM

## 2020-11-15 DIAGNOSIS — F32A Depression, unspecified: Secondary | ICD-10-CM | POA: Diagnosis not present

## 2020-11-15 DIAGNOSIS — D509 Iron deficiency anemia, unspecified: Secondary | ICD-10-CM | POA: Insufficient documentation

## 2020-11-15 DIAGNOSIS — I4891 Unspecified atrial fibrillation: Secondary | ICD-10-CM | POA: Diagnosis not present

## 2020-11-15 DIAGNOSIS — Z7901 Long term (current) use of anticoagulants: Secondary | ICD-10-CM | POA: Insufficient documentation

## 2020-11-15 HISTORY — PX: HEMOSTASIS CLIP PLACEMENT: SHX6857

## 2020-11-15 HISTORY — PX: COLONOSCOPY WITH PROPOFOL: SHX5780

## 2020-11-15 HISTORY — PX: ESOPHAGOGASTRODUODENOSCOPY (EGD) WITH PROPOFOL: SHX5813

## 2020-11-15 HISTORY — PX: SUBMUCOSAL TATTOO INJECTION: SHX6856

## 2020-11-15 HISTORY — PX: BIOPSY: SHX5522

## 2020-11-15 LAB — GLUCOSE, CAPILLARY: Glucose-Capillary: 171 mg/dL — ABNORMAL HIGH (ref 70–99)

## 2020-11-15 LAB — CBC
HCT: 35.3 % — ABNORMAL LOW (ref 36.0–46.0)
Hemoglobin: 11.2 g/dL — ABNORMAL LOW (ref 12.0–15.0)
MCH: 28.8 pg (ref 26.0–34.0)
MCHC: 31.7 g/dL (ref 30.0–36.0)
MCV: 90.7 fL (ref 80.0–100.0)
Platelets: 121 10*3/uL — ABNORMAL LOW (ref 150–400)
RBC: 3.89 MIL/uL (ref 3.87–5.11)
RDW: 15.4 % (ref 11.5–15.5)
WBC: 8.9 10*3/uL (ref 4.0–10.5)
nRBC: 0 % (ref 0.0–0.2)

## 2020-11-15 LAB — IRON AND TIBC
Iron: 74 ug/dL (ref 28–170)
Saturation Ratios: 18 % (ref 10.4–31.8)
TIBC: 416 ug/dL (ref 250–450)
UIBC: 342 ug/dL

## 2020-11-15 LAB — FERRITIN: Ferritin: 48 ng/mL (ref 11–307)

## 2020-11-15 SURGERY — COLONOSCOPY WITH PROPOFOL
Anesthesia: General

## 2020-11-15 MED ORDER — LACTATED RINGERS IV SOLN: INTRAVENOUS | Status: DC

## 2020-11-15 MED ORDER — PROPOFOL 500 MG/50ML IV EMUL
INTRAVENOUS | Status: DC | PRN
  Administered 2020-11-15: 150 ug/kg/min via INTRAVENOUS

## 2020-11-15 MED ORDER — PROPOFOL 10 MG/ML IV BOLUS
INTRAVENOUS | Status: DC | PRN
  Administered 2020-11-15: 30 mg via INTRAVENOUS
  Administered 2020-11-15 (×2): 20 mg via INTRAVENOUS
  Administered 2020-11-15: 50 mg via INTRAVENOUS
  Administered 2020-11-15: 30 mg via INTRAVENOUS

## 2020-11-15 MED ORDER — SPOT INK MARKER SYRINGE KIT
PACK | SUBMUCOSAL | Status: DC | PRN
  Administered 2020-11-15: .4 mL via SUBMUCOSAL

## 2020-11-15 MED ORDER — STERILE WATER FOR IRRIGATION IR SOLN
Status: DC | PRN
  Administered 2020-11-15: 100 mL
  Administered 2020-11-15: 200 mL

## 2020-11-15 NOTE — Anesthesia Preprocedure Evaluation (Signed)
Anesthesia Evaluation  Patient identified by MRN, date of birth, ID band Patient awake    Reviewed: Allergy & Precautions, H&P , NPO status , Patient's Chart, lab work & pertinent test results, reviewed documented beta blocker date and time   Airway Mallampati: II  TM Distance: >3 FB Neck ROM: full    Dental no notable dental hx.    Pulmonary neg pulmonary ROS,    Pulmonary exam normal breath sounds clear to auscultation       Cardiovascular Exercise Tolerance: Good hypertension, negative cardio ROS   Rhythm:regular Rate:Normal     Neuro/Psych  Headaches, PSYCHIATRIC DISORDERS Anxiety Depression    GI/Hepatic Neg liver ROS, GERD  Medicated,  Endo/Other  negative endocrine ROSdiabetes  Renal/GU negative Renal ROS  negative genitourinary   Musculoskeletal   Abdominal   Peds  Hematology  (+) Blood dyscrasia, anemia ,   Anesthesia Other Findings   Reproductive/Obstetrics negative OB ROS                             Anesthesia Physical Anesthesia Plan  ASA: 3  Anesthesia Plan: General   Post-op Pain Management:    Induction:   PONV Risk Score and Plan: Propofol infusion  Airway Management Planned:   Additional Equipment:   Intra-op Plan:   Post-operative Plan:   Informed Consent: I have reviewed the patients History and Physical, chart, labs and discussed the procedure including the risks, benefits and alternatives for the proposed anesthesia with the patient or authorized representative who has indicated his/her understanding and acceptance.     Dental Advisory Given  Plan Discussed with: CRNA  Anesthesia Plan Comments:         Anesthesia Quick Evaluation

## 2020-11-15 NOTE — Op Note (Signed)
[facility] Patient Name: Sandra [name] Procedure Date: 11/15/2020 11:46 AM MRN: [id number] Date of Birth: May 10, 1940 Attending MD: Maylon Peppers ,  CSN: [id number] Age: 80 Admit Type: Outpatient Procedure:                Upper GI endoscopy Indications:              Iron deficiency anemia, Heartburn Providers:                Maylon Peppers, Crystal Page, Aram Candela Referring MD:              Medicines:                Monitored Anesthesia Care Complications:            No immediate complications. Estimated Blood Loss:     Estimated blood loss: none. Procedure:                Pre-Anesthesia Assessment:                           - Prior to the procedure, a History and Physical                            was performed, and patient medications, allergies                            and sensitivities were reviewed. The patient's                            tolerance of previous anesthesia was reviewed.                           - The risks and benefits of the procedure and the                            sedation options and risks were discussed with the                            patient. All questions were answered and informed                            consent was obtained.                           - ASA Grade Assessment: III - A patient with severe                            systemic disease.                           After obtaining informed consent, the endoscope was                            passed under direct vision. Throughout the                            procedure, the patient's blood pressure,  pulse, and                            oxygen saturations were monitored continuously. The                            [id number]-[id number] scope was introduced through the                            mouth, and advanced to the second part of duodenum.                            The upper GI endoscopy was accomplished without                            difficulty. The patient  tolerated the procedure                            well. Scope In: 12:42:06 PM Scope Out: 12:48:53 PM Total Procedure Duration: 0 hours 6 minutes 47 seconds  Findings:      A 2 cm hiatal hernia was present.      A few 3 to 4 mm sessile polyps with no bleeding and no stigmata of       recent bleeding were found in the gastric fundus. One of the polyp had       erythematous appearance, this was biopsied with a cold forceps for       histology.      The examined duodenum was normal. Biopsies were taken with a cold       forceps for histology. Impression:               - 2 cm hiatal hernia.                           - A few gastric polyps. Biopsied.                           - Normal examined duodenum. Biopsied. Moderate Sedation:      Per Anesthesia Care Recommendation:           - Discharge patient to home (ambulatory).                           - Resume previous diet.                           - Await pathology results.                           - Use Prilosec (omeprazole) 40 mg PO BID. Stop                            famotidine. Procedure Code(s):        --- Professional ---                           [phone], Esophagogastroduodenoscopy, flexible,  transoral; with biopsy, single or multiple Diagnosis Code(s):        --- Professional ---                           K44.9, Diaphragmatic hernia without obstruction or                            gangrene                           K31.7, Polyp of stomach and duodenum                           D50.9, Iron deficiency anemia, unspecified                           R12, Heartburn CPT copyright 2019 [employer]. All rights reserved. The codes documented in this report are preliminary and upon coder review may  be revised to meet current compliance requirements. Maylon Peppers, MD Maylon Peppers,  11/15/2020 2:06:15 PM This report has been signed electronically. Number of Addenda: 0

## 2020-11-15 NOTE — Transfer of Care (Signed)
Immediate Anesthesia Transfer of Care Note  Patient: Sandra [name]  Procedure(s) Performed: COLONOSCOPY WITH PROPOFOL ESOPHAGOGASTRODUODENOSCOPY (EGD) WITH PROPOFOL BIOPSY HEMOSTASIS CLIP PLACEMENT SUBMUCOSAL TATTOO INJECTION  Patient Location: Endoscopy Unit  Anesthesia Type:General  Level of Consciousness: awake  Airway & Oxygen Therapy: Patient Spontanous Breathing  Post-op Assessment: Report given to RN  Post vital signs: Reviewed and stable  Last Vitals:  Vitals Value Taken Time  BP    Temp    Pulse    Resp    SpO2      Last Pain:  Vitals:   11/15/20 1237  TempSrc:   PainSc: 0-No pain      Patients Stated Pain Goal: 4 ([id number] [phone])  Complications: No notable events documented.

## 2020-11-15 NOTE — Discharge Instructions (Addendum)
You are being discharged to home.  Resume your previous diet.  We are waiting for your pathology results.  Take Prilosec (omeprazole) 40 mg by mouth twice a day.  Stop famotidine. Restart Xarelto in 72 hours. Your physician has recommended a repeat colonoscopy in six months for surveillance after today's piecemeal polypectomy.  Restart Xarelto in 72 hours Will determine need for capsule endoscopy based on blood testing today.

## 2020-11-15 NOTE — Anesthesia Postprocedure Evaluation (Signed)
Anesthesia Post Note  Patient: Sandra [name]  Procedure(s) Performed: COLONOSCOPY WITH PROPOFOL ESOPHAGOGASTRODUODENOSCOPY (EGD) WITH PROPOFOL BIOPSY HEMOSTASIS CLIP PLACEMENT SUBMUCOSAL TATTOO INJECTION  Patient location during evaluation: Endoscopy Anesthesia Type: General Level of consciousness: awake and alert Pain management: pain level controlled Vital Signs Assessment: post-procedure vital signs reviewed and stable Respiratory status: spontaneous breathing Cardiovascular status: blood pressure returned to baseline and stable Postop Assessment: no apparent nausea or vomiting Anesthetic complications: no   No notable events documented.   Last Vitals:  Vitals:   11/15/20 1031  BP: (!) 155/66  Pulse: 77  Resp: 12  Temp: 36.7 C  SpO2: 98%    Last Pain:  Vitals:   11/15/20 1237  TempSrc:   PainSc: 0-No pain                 Brayleigh Rybacki

## 2020-11-15 NOTE — H&P (Signed)
Sandra [name] is an 80 y.o. female.   Chief Complaint: iron deficiency anemia HPI: 80 y.o. female with PMH PE and DVT s/p Eliquis, stroke, afib, diabetes, depression, and anxiety, who presents for evaluation of heartburn and iron deficiency anemia.  States that she has been taking her iron compliantly.  Reports that she had some episodes of hematochezia recently but nothing frequent.  No melena.  Denies having any nausea, vomiting, fever, chills.  Has presented recurrent heartburn despite taking the omeprazole compliantly.  Also takes Pepcid every day.  The patient was ordered to have a CBC checked and iron stores but she did not perform the studies.  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; 2017   after an ankle fracture.: "don't know what caused the one in 2017"   Hypertension    Migraine    "more often here lately" (01/26/2018)   Mini stroke ([place]) 2018   "a series of mini strokes" (01/26/2018)   Mitral regurgitation 07/2011   moderate   Pulmonary embolism ([place]) 04/2011   Type II diabetes mellitus ([place])     Past Surgical History:  Procedure Laterality Date   ABDOMINAL HYSTERECTOMY     BUNIONECTOMY     CATARACT EXTRACTION Bilateral    COLONOSCOPY  05/07/2012   Procedure: COLONOSCOPY;  Surgeon: Rogene Houston, MD;  Location: [facility];  Service: Endoscopy;  Laterality: N/A;  245   HERNIA REPAIR     TONSILLECTOMY      Family History  Problem Relation Age of Onset   Hypertension Mother    Diabetes Mother    Heart attack Mother    Social History:  reports that she has never smoked. She has never used smokeless tobacco. She reports that she does not drink alcohol and does not use drugs.  Allergies:  Allergies  Allergen Reactions   Buprenex [Buprenorphine] Nausea And Vomiting   Namenda [Memantine Hcl] Other  (See Comments)    Sores in the mouth   Other Other (See Comments)    Shot for pain caused severe nausea. Had to spend the night in the hospital. Shot to numb mouth at the dentist = made her feel like she was smothering.     Medications Prior to Admission  Medication Sig Dispense Refill   acetaminophen (TYLENOL) 650 MG CR tablet Take 650 mg by mouth every 4 (four) hours as needed for pain.     amLODipine (NORVASC) 5 MG tablet Take 5 mg by mouth daily.     aspirin 81 MG chewable tablet Chew 81 mg by mouth daily.     atorvastatin (LIPITOR) 80 MG tablet Take 80 mg by mouth daily.     bisacodyl (DULCOLAX) 5 MG EC tablet Take 10 mg by mouth as directed. Take 2 tablets (10 mg) by mouth one time only for procedure     calcium carbonate (TUMS - DOSED IN MG ELEMENTAL CALCIUM) 500 MG chewable tablet Chew 1 tablet by mouth every 8 (eight) hours as needed for indigestion.     cholestyramine (QUESTRAN) 4 g packet Take 4 g by mouth daily.     diclofenac Sodium (VOLTAREN) 1 % GEL Apply 1 application topically 2 (two) times daily as needed (knee pain.).     diphenoxylate-atropine (LOMOTIL) 2.5-0.025 MG tablet Take 1 tablet by mouth  daily as needed for diarrhea or loose stools.     famotidine (PEPCID) 20 MG tablet Take 20 mg by mouth daily.     ferrous sulfate 325 (65 FE) MG tablet Take 325 mg by mouth daily with breakfast.     furosemide (LASIX) 40 MG tablet TAKE 1 TABLET BY MOUTH EVERY DAY 90 tablet 2   HUMALOG KWIKPEN 100 UNIT/ML KwikPen Inject 15-18 Units into the skin See admin instructions. Inject 18 units subcutaneously in the morning & Inject 15 units subcutaneously in the afternoon & inject 8 units subcutaneously in the evening. Hold if sugar is <115     LANTUS SOLOSTAR 100 UNIT/ML Solostar Pen Inject 30 Units into the skin in the morning and at bedtime.     Melatonin 3 MG TBDP Take 3 mg by mouth at bedtime.     metFORMIN (GLUCOPHAGE) 1000 MG tablet Take 1,000 mg by mouth daily.     metoprolol  succinate (TOPROL-XL) 25 MG 24 hr tablet TAKE THREE TABLETS ('75mg'$  total) BY MOUTH TWICE DAILY 180 tablet 3   metoprolol succinate (TOPROL-XL) 50 MG 24 hr tablet Take 1.5 tablets (75 mg total) by mouth in the morning and at bedtime. Take with or immediately following a meal. 270 tablet 3   nystatin (MYCOSTATIN) 100000 UNIT/ML suspension Use as directed 10 mLs in the mouth or throat daily as needed (DRY MOUTH).     omeprazole (PRILOSEC) 40 MG capsule Take 40 mg by mouth daily.     oxybutynin (DITROPAN-XL) 5 MG 24 hr tablet Take 5 mg by mouth daily.     polyethylene glycol-electrolytes (TRILYTE) 420 g solution Take 4,000 mLs by mouth as directed. 4000 mL 0   Probiotic Product (ALIGN) 4 MG CAPS Take 4 mg by mouth daily.     psyllium (METAMUCIL) 58.6 % packet Take 1 packet by mouth daily as needed (constipation).     rivaroxaban (XARELTO) 20 MG TABS tablet Take 20 mg by mouth at bedtime.     Semaglutide,0.25 or 0.'5MG'$ /DOS, (OZEMPIC, 0.25 OR 0.5 MG/DOSE,) 2 MG/1.5ML SOPN Inject 0.5 mg into the skin every Thursday.     sertraline (ZOLOFT) 50 MG tablet Take 50 mg by mouth at bedtime.     Glucose (TRUEPLUS GLUCOSE) 15 GM/32ML GEL Take 15 g by mouth daily as needed (blood sugar less than 60.).     ondansetron (ZOFRAN) 4 MG tablet Take 4 mg by mouth every 8 (eight) hours as needed for nausea or vomiting.     oxyCODONE-acetaminophen (PERCOCET/ROXICET) 5-325 MG tablet Take 1 tablet by mouth every 12 (twelve) hours as needed for severe pain.      Results for orders placed or performed during the hospital encounter of 11/15/20 (from the past 48 hour(s))  Glucose, capillary     Status: Abnormal   Collection Time: 11/15/20 10:34 AM  Result Value Ref Range   Glucose-Capillary 171 (H) 70 - 99 mg/dL    Comment: Glucose reference range applies only to samples taken after fasting for at least 8 hours.   No results found.  Review of Systems  Constitutional:  Positive for appetite change.  HENT: Negative.     Eyes: Negative.   Respiratory: Negative.    Cardiovascular: Negative.   Gastrointestinal:  Positive for anal bleeding.  Endocrine: Negative.   Genitourinary: Negative.   Musculoskeletal: Negative.   Skin: Negative.   Allergic/Immunologic: Negative.   Neurological: Negative.   Hematological: Negative.    Blood pressure (!) 155/66, pulse 77, temperature 98.1 F (  36.7 C), temperature source Oral, resp. rate 12, height '5\' 3"'$  (1.6 m), weight 72.6 kg, SpO2 98 %. Physical Exam  GENERAL: The patient is AO x3, in no acute distress. HEENT: Head is normocephalic and atraumatic. EOMI are intact. Mouth is well hydrated and without lesions. NECK: Supple. No masses LUNGS: Clear to auscultation. No presence of rhonchi/wheezing/rales. Adequate chest expansion HEART: RRR, normal s1 and s2. ABDOMEN: Soft, nontender, no guarding, no peritoneal signs, and nondistended. BS +. No masses. EXTREMITIES: Without any cyanosis, clubbing, rash, lesions or edema. NEUROLOGIC: AOx3, no focal motor deficit. SKIN: no jaundice, no rashes  Assessment/Plan  80 y.o. female with PMH PE and DVT s/p Eliquis, stroke, afib, diabetes, depression, and anxiety, who presents for evaluation of heartburn and iron deficiency anemia.  We will proceed with EGD and colonoscopy.  Harvel Quale, MD 11/15/2020, 11:56 AM

## 2020-11-15 NOTE — Op Note (Signed)
[facility] Patient Name: Sandra [name] Procedure Date: 11/15/2020 12:50 PM MRN: [id number] Date of Birth: 10-18-1940 Attending MD: Maylon Peppers ,  CSN: [id number] Age: 80 Admit Type: Outpatient Procedure:                Colonoscopy Indications:              Anemia Providers:                Maylon Peppers, Crystal Page, Aram Candela Referring MD:              Medicines:                Monitored Anesthesia Care Complications:            No immediate complications. Estimated Blood Loss:     Estimated blood loss: none. Procedure:                Pre-Anesthesia Assessment:                           - Prior to the procedure, a History and Physical                            was performed, and patient medications, allergies                            and sensitivities were reviewed. The patient's                            tolerance of previous anesthesia was reviewed.                           - The risks and benefits of the procedure and the                            sedation options and risks were discussed with the                            patient. All questions were answered and informed                            consent was obtained.                           - ASA Grade Assessment: III - A patient with severe                            systemic disease.                           After obtaining informed consent, the colonoscope                            was passed under direct vision. Throughout the                            procedure, the patient's blood pressure, pulse, and  oxygen saturations were monitored continuously. The                            [id number] scope was introduced through                            the anus and advanced to the the cecum, identified                            by appendiceal orifice and ileocecal valve. The                            colonoscopy was performed without difficulty. The                             patient tolerated the procedure well. The quality                            of the bowel preparation was good. Scope In: 12:52:54 PM Scope Out: 1:58:18 PM Scope Withdrawal Time: 0 hours 58 minutes 17 seconds  Total Procedure Duration: 1 hour 5 minutes 24 seconds  Findings:      Hemorrhoids were found on perianal exam.      A 20 mm polyp was found in the ascending colon. The polyp was sessile.       It was located between two folds, with a complex position. The polyp was       removed with a piecemeal technique using a cold snare. Resection and       retrieval were complete. To prevent bleeding post-intervention, one       hemostatic clip was successfully placed. There was no bleeding at the       end of the procedure. 3 cm distal a contralateral area was successfully       injected with 2 mL [place] ink for tattooing.      A 5 mm polyp was found in the transverse colon. The polyp was sessile.       The polyp was removed with a cold snare. Resection and retrieval were       complete.      Non-bleeding internal hemorrhoids were found during retroflexion. The       hemorrhoids were small. Impression:               - Hemorrhoids found on perianal exam.                           - One 20 mm polyp in the ascending colon, removed                            piecemeal using a cold snare. Resected and                            retrieved. Clip was placed. Injected.                           - One 5 mm polyp in the transverse colon, removed  with a cold snare. Resected and retrieved.                           - Non-bleeding internal hemorrhoids. Moderate Sedation:      Per Anesthesia Care Recommendation:           - Discharge patient to home (ambulatory).                           - Resume previous diet.                           - Await pathology results.                           - Repeat colonoscopy in 6 months for surveillance                             after piecemeal polypectomy.                           - Restart Xarelto in 72 hours                           - Check CBC, iron stores today. Will determine need                            for capsule endoscopy based on this. Procedure Code(s):        --- Professional ---                           [id number], Colonoscopy, flexible; with removal of                            tumor(s), polyp(s), or other lesion(s) by snare                            technique                           45381, Colonoscopy, flexible; with directed                            submucosal injection(s), any substance Diagnosis Code(s):        --- Professional ---                           K64.8, Other hemorrhoids                           K63.5, Polyp of colon                           D64.9, Anemia, unspecified CPT copyright 2019 American Medical Association. All rights reserved. The codes documented in this report are preliminary and upon coder review may  be revised to meet current compliance requirements. Maylon Peppers, MD Maylon Peppers,  11/15/2020 2:13:51 PM This report has been  signed electronically. Number of Addenda: 0

## 2020-11-17 LAB — SURGICAL PATHOLOGY

## 2020-11-20 ENCOUNTER — Other Ambulatory Visit (INDEPENDENT_AMBULATORY_CARE_PROVIDER_SITE_OTHER): Payer: Self-pay | Admitting: Gastroenterology

## 2020-11-20 ENCOUNTER — Encounter (HOSPITAL_COMMUNITY): Payer: Self-pay | Admitting: Gastroenterology

## 2020-11-20 DIAGNOSIS — R857 Abnormal histological findings in specimens from digestive organs and abdominal cavity: Secondary | ICD-10-CM

## 2020-11-21 ENCOUNTER — Encounter (INDEPENDENT_AMBULATORY_CARE_PROVIDER_SITE_OTHER): Payer: Self-pay

## 2020-11-30 ENCOUNTER — Encounter: Payer: Self-pay | Admitting: Cardiology

## 2020-11-30 ENCOUNTER — Ambulatory Visit (INDEPENDENT_AMBULATORY_CARE_PROVIDER_SITE_OTHER): Payer: Medicare Other | Admitting: Cardiology

## 2020-11-30 VITALS — BP 136/65 | HR 83 | Ht 63.0 in | Wt 170.6 lb

## 2020-11-30 DIAGNOSIS — I10 Essential (primary) hypertension: Secondary | ICD-10-CM | POA: Diagnosis not present

## 2020-11-30 DIAGNOSIS — R002 Palpitations: Secondary | ICD-10-CM

## 2020-11-30 DIAGNOSIS — R197 Diarrhea, unspecified: Secondary | ICD-10-CM | POA: Diagnosis not present

## 2020-11-30 DIAGNOSIS — I5022 Chronic systolic (congestive) heart failure: Secondary | ICD-10-CM | POA: Diagnosis not present

## 2020-11-30 MED ORDER — METOPROLOL SUCCINATE ER 100 MG PO TB24: 100.0000 mg | ORAL_TABLET | Freq: Every day | ORAL | 3 refills | Status: DC

## 2020-11-30 MED ORDER — LOSARTAN POTASSIUM 25 MG PO TABS: 25.0000 mg | ORAL_TABLET | Freq: Every day | ORAL | 6 refills | Status: DC

## 2020-11-30 NOTE — Progress Notes (Signed)
Clinical Summary Sandra [name] is a 80 y.o.female seen today for follow up of the following medical problems   SOB/LE edema - recent LE edema, L>R.  - has had some recent SOB/DOE, that progressing -  07/2014 echo LVEF 60-65%, grade I dd,   07/2020 echo pcp:  LVEF 45%, grade I dd, mild to mod MR, mild to mod TR  - swelling unchanged per her report but family reports its much improved, weight up from 164 to 170 lbs - breathing remains short, unchanged     2. Palpitations - from notes has had PVCs and short runs of PSVT - reports some recent symptoms - compliant with toprol  - some palpitations, has improved to some degree since increasing her toprol     3. Mitral regurgitation - mild to moderate by 2020 echo at [facility]       4. History of PE - on anticoag, she is on xarelto   5. Iron deficient anemia - followed by GI - colonscopy hemorroids, 2 polyps - EGD few gastric polyps  Past Medical History:  Diagnosis Date   Anxiety    Arthritis    "all over; especially in her hands" (01/26/2018)   Atrial fibrillation ([place])    Depression    DVT (deep venous thrombosis), left 04/2011; 2017   after an ankle fracture.: "don't know what caused the one in 2017"   Hypertension    Migraine    "more often here lately" (01/26/2018)   Mini stroke ([place]) 2018   "a series of mini strokes" (01/26/2018)   Mitral regurgitation 07/2011   moderate   Pulmonary embolism ([place]) 04/2011   Type II diabetes mellitus (HCC)      Allergies  Allergen Reactions   Buprenex [Buprenorphine] Nausea And Vomiting   Namenda [Memantine Hcl] Other (See Comments)    Sores in the mouth   Other Other (See Comments)    Shot for pain caused severe nausea. Had to spend the night in the hospital. Shot to numb mouth at the dentist = made her feel like she was smothering.      Current Outpatient Medications  Medication Sig Dispense Refill   acetaminophen (TYLENOL) 650 MG CR tablet Take 650 mg by mouth  every 4 (four) hours as needed for pain.     amLODipine (NORVASC) 5 MG tablet Take 5 mg by mouth daily.     aspirin 81 MG chewable tablet Chew 81 mg by mouth daily.     atorvastatin (LIPITOR) 80 MG tablet Take 80 mg by mouth daily.     bisacodyl (DULCOLAX) 5 MG EC tablet Take 10 mg by mouth as directed. Take 2 tablets (10 mg) by mouth one time only for procedure     calcium carbonate (TUMS - DOSED IN MG ELEMENTAL CALCIUM) 500 MG chewable tablet Chew 1 tablet by mouth every 8 (eight) hours as needed for indigestion.     cholestyramine (QUESTRAN) 4 g packet Take 4 g by mouth daily.     diclofenac Sodium (VOLTAREN) 1 % GEL Apply 1 application topically 2 (two) times daily as needed (knee pain.).     diphenoxylate-atropine (LOMOTIL) 2.5-0.025 MG tablet Take 1 tablet by mouth daily as needed for diarrhea or loose stools.     famotidine (PEPCID) 20 MG tablet Take 20 mg by mouth daily.     ferrous sulfate 325 (65 FE) MG tablet Take 325 mg by mouth daily with breakfast.     furosemide (LASIX) 40 MG  tablet TAKE 1 TABLET BY MOUTH EVERY DAY 90 tablet 2   Glucose (TRUEPLUS GLUCOSE) 15 GM/32ML GEL Take 15 g by mouth daily as needed (blood sugar less than 60.).     HUMALOG KWIKPEN 100 UNIT/ML KwikPen Inject 15-18 Units into the skin See admin instructions. Inject 18 units subcutaneously in the morning & Inject 15 units subcutaneously in the afternoon & inject 8 units subcutaneously in the evening. Hold if sugar is <115     LANTUS SOLOSTAR 100 UNIT/ML Solostar Pen Inject 30 Units into the skin in the morning and at bedtime.     Melatonin 3 MG TBDP Take 3 mg by mouth at bedtime.     metFORMIN (GLUCOPHAGE) 1000 MG tablet Take 1,000 mg by mouth daily.     metoprolol succinate (TOPROL-XL) 25 MG 24 hr tablet TAKE THREE TABLETS ('75mg'$  total) BY MOUTH TWICE DAILY 180 tablet 3   metoprolol succinate (TOPROL-XL) 50 MG 24 hr tablet Take 1.5 tablets (75 mg total) by mouth in the morning and at bedtime. Take with or  immediately following a meal. 270 tablet 3   nystatin (MYCOSTATIN) 100000 UNIT/ML suspension Use as directed 10 mLs in the mouth or throat daily as needed (DRY MOUTH).     omeprazole (PRILOSEC) 40 MG capsule Take 40 mg by mouth daily.     ondansetron (ZOFRAN) 4 MG tablet Take 4 mg by mouth every 8 (eight) hours as needed for nausea or vomiting.     oxybutynin (DITROPAN-XL) 5 MG 24 hr tablet Take 5 mg by mouth daily.     oxyCODONE-acetaminophen (PERCOCET/ROXICET) 5-325 MG tablet Take 1 tablet by mouth every 12 (twelve) hours as needed for severe pain.     Probiotic Product (ALIGN) 4 MG CAPS Take 4 mg by mouth daily.     psyllium (METAMUCIL) 58.6 % packet Take 1 packet by mouth daily as needed (constipation).     rivaroxaban (XARELTO) 20 MG TABS tablet Take 20 mg by mouth at bedtime.     Semaglutide,0.25 or 0.'5MG'$ /DOS, (OZEMPIC, 0.25 OR 0.5 MG/DOSE,) 2 MG/1.5ML SOPN Inject 0.5 mg into the skin every Thursday.     sertraline (ZOLOFT) 50 MG tablet Take 50 mg by mouth at bedtime.     No current facility-administered medications for this visit.     Past Surgical History:  Procedure Laterality Date   ABDOMINAL HYSTERECTOMY     BIOPSY  11/15/2020   Procedure: BIOPSY;  Surgeon: Harvel Quale, MD;  [facility];  Service: Gastroenterology;;   Lillard Anes     CATARACT EXTRACTION Bilateral    COLONOSCOPY  05/07/2012   Procedure: COLONOSCOPY;  Surgeon: Rogene Houston, MD;  [facility];  Service: Endoscopy;  Laterality: N/A;  245   COLONOSCOPY WITH PROPOFOL N/A 11/15/2020   Procedure: COLONOSCOPY WITH PROPOFOL;  Surgeon: Harvel Quale, MD;  [facility];  Service: Gastroenterology;  Laterality: N/A;  8:30, facility pt, will need rapid covid test   ESOPHAGOGASTRODUODENOSCOPY (EGD) WITH PROPOFOL N/A 11/15/2020   Procedure: ESOPHAGOGASTRODUODENOSCOPY (EGD) WITH PROPOFOL;  Surgeon: Harvel Quale, MD;  [facility];  Service:  Gastroenterology;  Laterality: N/A;   HEMOSTASIS CLIP PLACEMENT  11/15/2020   Procedure: HEMOSTASIS CLIP PLACEMENT;  Surgeon: Harvel Quale, MD;  [facility];  Service: Gastroenterology;;   HERNIA REPAIR     SUBMUCOSAL TATTOO INJECTION  11/15/2020   Procedure: SUBMUCOSAL TATTOO INJECTION;  Surgeon: Harvel Quale, MD;  [facility];  Service: Gastroenterology;;   TONSILLECTOMY       Allergies  Allergen Reactions   Buprenex [Buprenorphine] Nausea And Vomiting   Namenda [Memantine Hcl] Other (See Comments)    Sores in the mouth   Other Other (See Comments)    Shot for pain caused severe nausea. Had to spend the night in the hospital. Shot to numb mouth at the dentist = made her feel like she was smothering.       Family History  Problem Relation Age of Onset   Hypertension Mother    Diabetes Mother    Heart attack Mother      Social History Ms. Mcmunn reports that she has never smoked. She has never used smokeless tobacco. Ms. Brooker reports no history of alcohol use.   Review of Systems CONSTITUTIONAL: No weight loss, fever, chills, weakness or fatigue.  HEENT: Eyes: No visual loss, blurred vision, double vision or yellow sclerae.No hearing loss, sneezing, congestion, runny nose or sore throat.  SKIN: No rash or itching.  CARDIOVASCULAR: per hpi RESPIRATORY: No shortness of breath, cough or sputum.  GASTROINTESTINAL: No anorexia, nausea, vomiting or diarrhea. No abdominal pain or blood.  GENITOURINARY: No burning on urination, no polyuria NEUROLOGICAL: No headache, dizziness, syncope, paralysis, ataxia, numbness or tingling in the extremities. No change in bowel or bladder control.  MUSCULOSKELETAL: No muscle, back pain, joint pain or stiffness.  LYMPHATICS: No enlarged nodes. No history of splenectomy.  PSYCHIATRIC: No history of depression or anxiety.  ENDOCRINOLOGIC: No reports of sweating, cold or heat intolerance. No polyuria  or polydipsia.  Marland Kitchen   Physical Examination Today's Vitals   11/30/20 1332  BP: 136/65  Pulse: 83  SpO2: 96%  Weight: 170 lb 9.6 oz (77.4 kg)  Height: '5\' 3"'$  (1.6 m)   Body mass index is 30.22 kg/m.  Gen: resting comfortably, no acute distress HEENT: no scleral icterus, pupils equal round and reactive, no palptable cervical adenopathy,  CV: RRR, no m/r/g no jvd Resp: Clear to auscultation bilaterally GI: abdomen is soft, non-tender, non-distended, normal bowel sounds, no hepatosplenomegaly MSK: extremities are warm, no edema.  Skin: warm, no rash Neuro:  no focal deficits Psych: appropriate affect   Diagnostic Studies echo from pcp dated 08/07/2020 LVEF 45%, abnormal diastolicfunction based on E/A ratio would be grade I. Mild to mod MR, , mild to mod TR.    11/2020 echo A complete portable two-dimensional transthoracic echocardiogram with color  flow Doppler and Spectral Doppler was performed. Saline contrast injection  was performed. The left ventricle is grossly normal size.  Injection of contrast documented no interatrial shunt .  There is borderline left ventricular hypertrophy.  The left ventricular ejection fraction is normal (60-65%).  The left ventricular wall motion is normal.  Right ventricular systolic pressure is normal.  The aortic valve is trileaflet.  Grade I mild diastolic dysfunction; abnormal relaxation pattern.  There is mild-moderate (1-2+) mitral regurgitation.     Assessment and Plan   1.Chronic sysotlic HF - mild LV dysfunction by echo at pcp's office - increase toprol to '100mg'$  daily, start losartan '25mg'$  daily - continue lasix, patient reports swelling unchanged by family reports much improved. Weights trended up some since last visit. May need to titrate diuretic pending upcoming labs  - titrate toprol and losartan as tolerated, repeat echo in next few months, if ongoing dysfunction and tolerating ARB would convert to entresto at that time.     2. Palpitations - long history with benign ectopy on prior  monitors - increase toprol to '100mg'$  daily       3. HTN - stop norvasc, in setting of systolic dysfunction start losartan '25mg'$  daily     Arnoldo Lenis, M.D.

## 2020-11-30 NOTE — Patient Instructions (Addendum)
Medication Instructions:  Increase Toprol XL to '100mg'$  daily.  Stop Amlodipine (Norvasc). Begin Losartan '25mg'$  daily.  Continue all other medications.     Labwork: BMET, Mg - orders given today.  Please do in 2 weeks (around 12/14/2020) at [facility] (main entrance). Office will contact with results via phone or letter.     Testing/Procedures: none  Follow-Up: 6 weeks   Any Other Special Instructions Will Be Listed Below (If Applicable).  If you need a refill on your cardiac medications before your next appointment, please call your pharmacy.

## 2020-12-01 DIAGNOSIS — E119 Type 2 diabetes mellitus without complications: Secondary | ICD-10-CM | POA: Diagnosis not present

## 2020-12-04 ENCOUNTER — Encounter (HOSPITAL_COMMUNITY): Payer: Self-pay | Admitting: Gastroenterology

## 2020-12-04 ENCOUNTER — Ambulatory Visit (HOSPITAL_COMMUNITY)
Admission: RE | Admit: 2020-12-04 | Discharge: 2020-12-04 | Disposition: A | Discharge status: Home / Self Care | Payer: Medicare Other | Source: Ambulatory Visit | Attending: Gastroenterology | Admitting: Gastroenterology

## 2020-12-04 ENCOUNTER — Encounter (HOSPITAL_COMMUNITY)
Admission: RE | Disposition: A | Discharge status: Home / Self Care | Payer: Self-pay | Source: Ambulatory Visit | Attending: Gastroenterology

## 2020-12-04 DIAGNOSIS — K639 Disease of intestine, unspecified: Secondary | ICD-10-CM | POA: Diagnosis not present

## 2020-12-04 DIAGNOSIS — K633 Ulcer of intestine: Secondary | ICD-10-CM | POA: Insufficient documentation

## 2020-12-04 DIAGNOSIS — D500 Iron deficiency anemia secondary to blood loss (chronic): Secondary | ICD-10-CM | POA: Insufficient documentation

## 2020-12-04 HISTORY — PX: GIVENS CAPSULE STUDY: SHX5432

## 2020-12-04 SURGERY — IMAGING PROCEDURE, GI TRACT, INTRALUMINAL, VIA CAPSULE

## 2020-12-06 DIAGNOSIS — I69852 Hemiplegia and hemiparesis following other cerebrovascular disease affecting left dominant side: Secondary | ICD-10-CM | POA: Diagnosis not present

## 2020-12-06 DIAGNOSIS — H26493 Other secondary cataract, bilateral: Secondary | ICD-10-CM | POA: Diagnosis not present

## 2020-12-06 DIAGNOSIS — E083313 Diabetes mellitus due to underlying condition with moderate nonproliferative diabetic retinopathy with macular edema, bilateral: Secondary | ICD-10-CM | POA: Diagnosis not present

## 2020-12-06 DIAGNOSIS — E1142 Type 2 diabetes mellitus with diabetic polyneuropathy: Secondary | ICD-10-CM | POA: Diagnosis not present

## 2020-12-06 DIAGNOSIS — H401310 Pigmentary glaucoma, right eye, stage unspecified: Secondary | ICD-10-CM | POA: Diagnosis not present

## 2020-12-06 DIAGNOSIS — H04121 Dry eye syndrome of right lacrimal gland: Secondary | ICD-10-CM | POA: Diagnosis not present

## 2020-12-06 NOTE — Procedures (Signed)
Small Bowel [place] Capsule Study Procedure date:  12/04/2020  Referring Provider:  PCP PCP:  Dr. Glenda Chroman, MD  Indication for procedure:  Iron deficiency anemia  Findings:  Preparation of the small bowel was adequate. Capsule reached the colon. There was presence of two isolated erosions in the mid small bowel without presence of active bleeding. The rest of the gastric and small bowel lining were normal  First Gastric image:  00:01:10 First Duodenal image: 00:21:47 First Cecal image: 03:44:10 Gastric Passage time: 0h 62mSmall Bowel Passage time:  3h 282m   Summary & Recommendations: There was presence of two isolated erosions in the mid small bowel without presence of active bleeding. These could explain her IDA. She has responded well to iron supplementation with normal stores and stable hemoglobin. Advised daughter to continue iron PO and follow up in GI clinic in 6 months.  - Continue oral iron intake - Follow up in GI clinic in 6 months  I personally communicated these recommendations to the patient  DaMaylon PeppersMD Gastroenterology and Hepatology [facility]

## 2020-12-07 DIAGNOSIS — I10 Essential (primary) hypertension: Secondary | ICD-10-CM | POA: Diagnosis not present

## 2020-12-07 DIAGNOSIS — Z299 Encounter for prophylactic measures, unspecified: Secondary | ICD-10-CM | POA: Diagnosis not present

## 2020-12-07 DIAGNOSIS — I509 Heart failure, unspecified: Secondary | ICD-10-CM | POA: Diagnosis not present

## 2020-12-07 DIAGNOSIS — E1165 Type 2 diabetes mellitus with hyperglycemia: Secondary | ICD-10-CM | POA: Diagnosis not present

## 2020-12-07 DIAGNOSIS — W19XXXA Unspecified fall, initial encounter: Secondary | ICD-10-CM | POA: Diagnosis not present

## 2020-12-12 DIAGNOSIS — H401310 Pigmentary glaucoma, right eye, stage unspecified: Secondary | ICD-10-CM | POA: Diagnosis not present

## 2020-12-12 DIAGNOSIS — I69852 Hemiplegia and hemiparesis following other cerebrovascular disease affecting left dominant side: Secondary | ICD-10-CM | POA: Diagnosis not present

## 2020-12-12 DIAGNOSIS — H04121 Dry eye syndrome of right lacrimal gland: Secondary | ICD-10-CM | POA: Diagnosis not present

## 2020-12-12 DIAGNOSIS — H26493 Other secondary cataract, bilateral: Secondary | ICD-10-CM | POA: Diagnosis not present

## 2020-12-12 DIAGNOSIS — E1142 Type 2 diabetes mellitus with diabetic polyneuropathy: Secondary | ICD-10-CM | POA: Diagnosis not present

## 2020-12-12 DIAGNOSIS — E083313 Diabetes mellitus due to underlying condition with moderate nonproliferative diabetic retinopathy with macular edema, bilateral: Secondary | ICD-10-CM | POA: Diagnosis not present

## 2020-12-13 DIAGNOSIS — H04121 Dry eye syndrome of right lacrimal gland: Secondary | ICD-10-CM | POA: Diagnosis not present

## 2020-12-13 DIAGNOSIS — H401310 Pigmentary glaucoma, right eye, stage unspecified: Secondary | ICD-10-CM | POA: Diagnosis not present

## 2020-12-13 DIAGNOSIS — H26493 Other secondary cataract, bilateral: Secondary | ICD-10-CM | POA: Diagnosis not present

## 2020-12-13 DIAGNOSIS — I69852 Hemiplegia and hemiparesis following other cerebrovascular disease affecting left dominant side: Secondary | ICD-10-CM | POA: Diagnosis not present

## 2020-12-13 DIAGNOSIS — E083313 Diabetes mellitus due to underlying condition with moderate nonproliferative diabetic retinopathy with macular edema, bilateral: Secondary | ICD-10-CM | POA: Diagnosis not present

## 2020-12-13 DIAGNOSIS — E1142 Type 2 diabetes mellitus with diabetic polyneuropathy: Secondary | ICD-10-CM | POA: Diagnosis not present

## 2020-12-14 DIAGNOSIS — I509 Heart failure, unspecified: Secondary | ICD-10-CM | POA: Diagnosis not present

## 2020-12-14 DIAGNOSIS — E1165 Type 2 diabetes mellitus with hyperglycemia: Secondary | ICD-10-CM | POA: Diagnosis not present

## 2020-12-14 DIAGNOSIS — Z299 Encounter for prophylactic measures, unspecified: Secondary | ICD-10-CM | POA: Diagnosis not present

## 2020-12-14 DIAGNOSIS — Z713 Dietary counseling and surveillance: Secondary | ICD-10-CM | POA: Diagnosis not present

## 2020-12-14 DIAGNOSIS — I10 Essential (primary) hypertension: Secondary | ICD-10-CM | POA: Diagnosis not present

## 2020-12-18 DIAGNOSIS — H04121 Dry eye syndrome of right lacrimal gland: Secondary | ICD-10-CM | POA: Diagnosis not present

## 2020-12-18 DIAGNOSIS — I69852 Hemiplegia and hemiparesis following other cerebrovascular disease affecting left dominant side: Secondary | ICD-10-CM | POA: Diagnosis not present

## 2020-12-18 DIAGNOSIS — H401310 Pigmentary glaucoma, right eye, stage unspecified: Secondary | ICD-10-CM | POA: Diagnosis not present

## 2020-12-18 DIAGNOSIS — E083313 Diabetes mellitus due to underlying condition with moderate nonproliferative diabetic retinopathy with macular edema, bilateral: Secondary | ICD-10-CM | POA: Diagnosis not present

## 2020-12-18 DIAGNOSIS — H26493 Other secondary cataract, bilateral: Secondary | ICD-10-CM | POA: Diagnosis not present

## 2020-12-18 DIAGNOSIS — E1142 Type 2 diabetes mellitus with diabetic polyneuropathy: Secondary | ICD-10-CM | POA: Diagnosis not present

## 2020-12-21 DIAGNOSIS — I4891 Unspecified atrial fibrillation: Secondary | ICD-10-CM | POA: Diagnosis not present

## 2020-12-21 DIAGNOSIS — E1165 Type 2 diabetes mellitus with hyperglycemia: Secondary | ICD-10-CM | POA: Diagnosis not present

## 2020-12-21 DIAGNOSIS — I110 Hypertensive heart disease with heart failure: Secondary | ICD-10-CM | POA: Diagnosis not present

## 2020-12-21 DIAGNOSIS — I509 Heart failure, unspecified: Secondary | ICD-10-CM | POA: Diagnosis not present

## 2020-12-27 DIAGNOSIS — I509 Heart failure, unspecified: Secondary | ICD-10-CM | POA: Diagnosis not present

## 2020-12-27 DIAGNOSIS — I4891 Unspecified atrial fibrillation: Secondary | ICD-10-CM | POA: Diagnosis not present

## 2020-12-27 DIAGNOSIS — E1165 Type 2 diabetes mellitus with hyperglycemia: Secondary | ICD-10-CM | POA: Diagnosis not present

## 2020-12-27 DIAGNOSIS — I110 Hypertensive heart disease with heart failure: Secondary | ICD-10-CM | POA: Diagnosis not present

## 2020-12-30 DIAGNOSIS — E1165 Type 2 diabetes mellitus with hyperglycemia: Secondary | ICD-10-CM | POA: Diagnosis not present

## 2020-12-30 DIAGNOSIS — I110 Hypertensive heart disease with heart failure: Secondary | ICD-10-CM | POA: Diagnosis not present

## 2020-12-30 DIAGNOSIS — I509 Heart failure, unspecified: Secondary | ICD-10-CM | POA: Diagnosis not present

## 2020-12-30 DIAGNOSIS — I4891 Unspecified atrial fibrillation: Secondary | ICD-10-CM | POA: Diagnosis not present

## 2021-01-01 DIAGNOSIS — E119 Type 2 diabetes mellitus without complications: Secondary | ICD-10-CM | POA: Diagnosis not present

## 2021-01-04 DIAGNOSIS — I509 Heart failure, unspecified: Secondary | ICD-10-CM | POA: Diagnosis not present

## 2021-01-04 DIAGNOSIS — E1165 Type 2 diabetes mellitus with hyperglycemia: Secondary | ICD-10-CM | POA: Diagnosis not present

## 2021-01-04 DIAGNOSIS — I4891 Unspecified atrial fibrillation: Secondary | ICD-10-CM | POA: Diagnosis not present

## 2021-01-04 DIAGNOSIS — I110 Hypertensive heart disease with heart failure: Secondary | ICD-10-CM | POA: Diagnosis not present

## 2021-01-09 DIAGNOSIS — I509 Heart failure, unspecified: Secondary | ICD-10-CM | POA: Diagnosis not present

## 2021-01-09 DIAGNOSIS — I110 Hypertensive heart disease with heart failure: Secondary | ICD-10-CM | POA: Diagnosis not present

## 2021-01-09 DIAGNOSIS — E1165 Type 2 diabetes mellitus with hyperglycemia: Secondary | ICD-10-CM | POA: Diagnosis not present

## 2021-01-09 DIAGNOSIS — I4891 Unspecified atrial fibrillation: Secondary | ICD-10-CM | POA: Diagnosis not present

## 2021-01-10 NOTE — Progress Notes (Deleted)
Cardiology Office Note  Date: 01/10/2021   ID: Sandra [name], DOB 08/22/1940, MRN [id number]  PCP:  Glenda Chroman, MD  Cardiologist:  None Electrophysiologist:  None   Chief Complaint: ***  History of Present Illness: Sandra [name] is a 80 y.o. female with a history of chronic systolic heart failure, atrial fibrillation, DVT, PE, DM2, SOB, lower extremity edema, palpitations, iron deficiency anemia . She last saw Dr. Harl Bowie on 11/30/2020.  Had some recent lower extremity edema left greater than right with some recent SOB/DOE.  There was progression.  Previous echo in April 2022 with EF of 45%, G1 DD, mild to moderate MR, mild to moderate TR.  Swelling was unchanged per her report but family reported was much improved.  Weight was up from 164 to 170 pounds.  Her shortness of breath was unchanged.  She was continuing Xarelto for anticoagulation due to history of PE.  She was being followed by GI for iron deficiency anemia.  Previous colonoscopy and EGD with polyps.  Plan was to increase Toprol to 100 mg daily, start losartan 25 mg daily.  Continue Lasix.  Weights have trended up since prior visit.  Dr. Harl Bowie mentioned may need to titrate diuretic pending upcoming labs.  Titrate Toprol and losartan as tolerated.  Repeat echo in the next few months if ongoing dysfunction and tolerating ARB would convert to Entresto at that time.  Long history of palpitations with benign ectopy on prior monitors.  Increasing Toprol to 100 mg daily.  Norvasc was being stopped in setting of a systolic dysfunction and starting losartan 25 mg daily.  What were basic metabolic panel results.  Can we titrate medications.  Should we start Entresto and stop ARB?  Past Medical History:  Diagnosis Date   Anxiety    Arthritis    "all over; especially in her hands" (01/26/2018)   Atrial fibrillation ([place])    Depression    DVT (deep venous thrombosis), left 04/2011; 2017   after an ankle fracture.: "don't know what  caused the one in 2017"   Hypertension    Migraine    "more often here lately" (01/26/2018)   Mini stroke ([place]) 2018   "a series of mini strokes" (01/26/2018)   Mitral regurgitation 07/2011   moderate   Pulmonary embolism ([facility]) 04/2011   Type II diabetes mellitus ([place])     Past Surgical History:  Procedure Laterality Date   ABDOMINAL HYSTERECTOMY     BIOPSY  11/15/2020   Procedure: BIOPSY;  Surgeon: Harvel Quale, MD;  [facility];  Service: Gastroenterology;;   Lillard Anes     CATARACT EXTRACTION Bilateral    COLONOSCOPY  05/07/2012   Procedure: COLONOSCOPY;  Surgeon: Rogene Houston, MD;  [facility];  Service: Endoscopy;  Laterality: N/A;  245   COLONOSCOPY WITH PROPOFOL N/A 11/15/2020   Procedure: COLONOSCOPY WITH PROPOFOL;  Surgeon: Harvel Quale, MD;  [facility];  Service: Gastroenterology;  Laterality: N/A;  8:30, facility pt, will need rapid covid test   ESOPHAGOGASTRODUODENOSCOPY (EGD) WITH PROPOFOL N/A 11/15/2020   Procedure: ESOPHAGOGASTRODUODENOSCOPY (EGD) WITH PROPOFOL;  Surgeon: Harvel Quale, MD;  [facility];  Service: Gastroenterology;  Laterality: N/A;   GIVENS CAPSULE STUDY N/A 12/04/2020   Procedure: GIVENS CAPSULE STUDY;  Surgeon: Harvel Quale, MD;  [facility];  Service: Gastroenterology;  Laterality: N/A;  7:30   HEMOSTASIS CLIP PLACEMENT  11/15/2020   Procedure: HEMOSTASIS CLIP  PLACEMENT;  Surgeon: Montez Morita, Quillian Quince, MD;  [facility];  Service: Gastroenterology;;   HERNIA REPAIR     SUBMUCOSAL TATTOO INJECTION  11/15/2020   Procedure: SUBMUCOSAL TATTOO INJECTION;  Surgeon: Montez Morita, Quillian Quince, MD;  [facility];  Service: Gastroenterology;;   TONSILLECTOMY      Current Outpatient Medications  Medication Sig Dispense Refill   acetaminophen (TYLENOL) 650 MG CR tablet Take 650 mg by mouth every 4 (four) hours as needed for  pain.     aspirin 81 MG chewable tablet Chew 81 mg by mouth daily.     atorvastatin (LIPITOR) 80 MG tablet Take 80 mg by mouth daily.     bisacodyl (DULCOLAX) 5 MG EC tablet Take 10 mg by mouth as directed. Take 2 tablets (10 mg) by mouth one time only for procedure     calcium carbonate (TUMS - DOSED IN MG ELEMENTAL CALCIUM) 500 MG chewable tablet Chew 1 tablet by mouth every 8 (eight) hours as needed for indigestion.     cholestyramine (QUESTRAN) 4 g packet Take 4 g by mouth daily.     diclofenac Sodium (VOLTAREN) 1 % GEL Apply 1 application topically 2 (two) times daily as needed (knee pain.).     diphenoxylate-atropine (LOMOTIL) 2.5-0.025 MG tablet Take 1 tablet by mouth daily as needed for diarrhea or loose stools.     famotidine (PEPCID) 20 MG tablet Take 20 mg by mouth daily.     ferrous sulfate 325 (65 FE) MG tablet Take 325 mg by mouth daily with breakfast.     furosemide (LASIX) 40 MG tablet TAKE 1 TABLET BY MOUTH EVERY DAY 90 tablet 2   Glucose (TRUEPLUS GLUCOSE) 15 GM/32ML GEL Take 15 g by mouth daily as needed (blood sugar less than 60.).     HUMALOG KWIKPEN 100 UNIT/ML KwikPen Inject 15-18 Units into the skin See admin instructions. Inject 18 units subcutaneously in the morning & Inject 15 units subcutaneously in the afternoon & inject 8 units subcutaneously in the evening. Hold if sugar is <115     LANTUS SOLOSTAR 100 UNIT/ML Solostar Pen Inject 30 Units into the skin in the morning and at bedtime.     losartan (COZAAR) 25 MG tablet Take 1 tablet (25 mg total) by mouth daily. 30 tablet 6   Melatonin 3 MG TBDP Take 3 mg by mouth at bedtime.     metFORMIN (GLUCOPHAGE) 1000 MG tablet Take 1,000 mg by mouth daily.     metoprolol succinate (TOPROL XL) 100 MG 24 hr tablet Take 1 tablet (100 mg total) by mouth daily. 90 tablet 3   nystatin (MYCOSTATIN) 100000 UNIT/ML suspension Use as directed 10 mLs in the mouth or throat daily as needed (DRY MOUTH).     omeprazole (PRILOSEC) 40 MG  capsule Take 40 mg by mouth daily.     ondansetron (ZOFRAN) 4 MG tablet Take 4 mg by mouth every 8 (eight) hours as needed for nausea or vomiting.     oxybutynin (DITROPAN-XL) 5 MG 24 hr tablet Take 5 mg by mouth daily.     oxyCODONE-acetaminophen (PERCOCET/ROXICET) 5-325 MG tablet Take 1 tablet by mouth every 12 (twelve) hours as needed for severe pain.     Probiotic Product (ALIGN) 4 MG CAPS Take 4 mg by mouth daily.     psyllium (METAMUCIL) 58.6 % packet Take 1 packet by mouth daily as needed (constipation).     rivaroxaban (XARELTO) 20 MG TABS tablet Take 20 mg by  mouth at bedtime.     Semaglutide,0.25 or 0.5MG /DOS, (OZEMPIC, 0.25 OR 0.5 MG/DOSE,) 2 MG/1.5ML SOPN Inject 0.5 mg into the skin every Thursday.     sertraline (ZOLOFT) 50 MG tablet Take 50 mg by mouth at bedtime.     No current facility-administered medications for this visit.   Allergies:  Buprenex [buprenorphine], Namenda [memantine hcl], and Other   Social History: The patient  reports that she has never smoked. She has never used smokeless tobacco. She reports that she does not drink alcohol and does not use drugs.   Family History: The patient's family history includes Diabetes in her mother; Heart attack in her mother; Hypertension in her mother.   ROS:  Please see the history of present illness. Otherwise, complete review of systems is positive for {NONE DEFAULTED:18576}.  All other systems are reviewed and negative.   Physical Exam: VS:  There were no vitals taken for this visit., BMI There is no height or weight on file to calculate BMI.  Wt Readings from Last 3 Encounters:  12/04/20 164 lb (74.4 kg)  11/30/20 170 lb 9.6 oz (77.4 kg)  11/15/20 160 lb (72.6 kg)    General: Patient appears comfortable at rest. HEENT: Conjunctiva and lids normal, oropharynx clear with moist mucosa. Neck: Supple, no elevated JVP or carotid bruits, no thyromegaly. Lungs: Clear to auscultation, nonlabored breathing at rest. Cardiac:  Regular rate and rhythm, no S3 or significant systolic murmur, no pericardial rub. Abdomen: Soft, nontender, no hepatomegaly, bowel sounds present, no guarding or rebound. Extremities: No pitting edema, distal pulses 2+. Skin: Warm and dry. Musculoskeletal: No kyphosis. Neuropsychiatric: Alert and oriented x3, affect grossly appropriate.  ECG:  {EKG/Telemetry Strips Reviewed:[phone]}  Recent Labwork: 11/15/2020: Hemoglobin 11.2; Platelets 121  No results found for: CHOL, TRIG, HDL, CHOLHDL, VLDL, LDLCALC, LDLDIRECT  Other Studies Reviewed Today:  Diagnostic Studies echo from pcp dated 08/07/2020 LVEF 45%, abnormal diastolicfunction based on E/A ratio would be grade I. Mild to mod MR, , mild to mod TR.      11/2020 echo A complete portable two-dimensional transthoracic echocardiogram with color  flow Doppler and Spectral Doppler was performed. Saline contrast injection  was performed. The left ventricle is grossly normal size.  Injection of contrast documented no interatrial shunt .  There is borderline left ventricular hypertrophy.  The left ventricular ejection fraction is normal (60-65%).  The left ventricular wall motion is normal.  Right ventricular systolic pressure is normal.  The aortic valve is trileaflet.  Grade I mild diastolic dysfunction; abnormal relaxation pattern.  There is mild-moderate (1-2+) mitral regurgitation.    Assessment and Plan:  1. Chronic systolic heart failure (HCC)   2. Palpitations   3. Essential hypertension      Medication Adjustments/Labs and Tests Ordered: Current medicines are reviewed at length with the patient today.  Concerns regarding medicines are outlined above.   Disposition: Follow-up with ***  Signed, Levell July, NP 01/10/2021 9:36 PM    [place] [facility] [place], [place], [place] [address] Phone: [phone]; Fax: [phone]

## 2021-01-11 ENCOUNTER — Ambulatory Visit: Payer: Medicare Other | Admitting: Family Medicine

## 2021-01-11 DIAGNOSIS — I10 Essential (primary) hypertension: Secondary | ICD-10-CM

## 2021-01-11 DIAGNOSIS — R002 Palpitations: Secondary | ICD-10-CM

## 2021-01-11 DIAGNOSIS — I5022 Chronic systolic (congestive) heart failure: Secondary | ICD-10-CM

## 2021-01-15 DIAGNOSIS — I509 Heart failure, unspecified: Secondary | ICD-10-CM | POA: Diagnosis not present

## 2021-01-15 DIAGNOSIS — E1165 Type 2 diabetes mellitus with hyperglycemia: Secondary | ICD-10-CM | POA: Diagnosis not present

## 2021-01-15 DIAGNOSIS — I4891 Unspecified atrial fibrillation: Secondary | ICD-10-CM | POA: Diagnosis not present

## 2021-01-15 DIAGNOSIS — I110 Hypertensive heart disease with heart failure: Secondary | ICD-10-CM | POA: Diagnosis not present

## 2021-01-16 DIAGNOSIS — H524 Presbyopia: Secondary | ICD-10-CM | POA: Diagnosis not present

## 2021-01-16 DIAGNOSIS — E113391 Type 2 diabetes mellitus with moderate nonproliferative diabetic retinopathy without macular edema, right eye: Secondary | ICD-10-CM | POA: Diagnosis not present

## 2021-01-16 DIAGNOSIS — Z794 Long term (current) use of insulin: Secondary | ICD-10-CM | POA: Diagnosis not present

## 2021-01-16 DIAGNOSIS — Z961 Presence of intraocular lens: Secondary | ICD-10-CM | POA: Diagnosis not present

## 2021-01-16 DIAGNOSIS — Z7984 Long term (current) use of oral hypoglycemic drugs: Secondary | ICD-10-CM | POA: Diagnosis not present

## 2021-01-16 DIAGNOSIS — E113312 Type 2 diabetes mellitus with moderate nonproliferative diabetic retinopathy with macular edema, left eye: Secondary | ICD-10-CM | POA: Diagnosis not present

## 2021-01-22 DIAGNOSIS — E1165 Type 2 diabetes mellitus with hyperglycemia: Secondary | ICD-10-CM | POA: Diagnosis not present

## 2021-01-22 DIAGNOSIS — I110 Hypertensive heart disease with heart failure: Secondary | ICD-10-CM | POA: Diagnosis not present

## 2021-01-22 DIAGNOSIS — I509 Heart failure, unspecified: Secondary | ICD-10-CM | POA: Diagnosis not present

## 2021-01-22 DIAGNOSIS — I4891 Unspecified atrial fibrillation: Secondary | ICD-10-CM | POA: Diagnosis not present

## 2021-01-29 DIAGNOSIS — E1165 Type 2 diabetes mellitus with hyperglycemia: Secondary | ICD-10-CM | POA: Diagnosis not present

## 2021-01-29 DIAGNOSIS — I110 Hypertensive heart disease with heart failure: Secondary | ICD-10-CM | POA: Diagnosis not present

## 2021-01-29 DIAGNOSIS — I4891 Unspecified atrial fibrillation: Secondary | ICD-10-CM | POA: Diagnosis not present

## 2021-01-29 DIAGNOSIS — I509 Heart failure, unspecified: Secondary | ICD-10-CM | POA: Diagnosis not present

## 2021-01-30 DIAGNOSIS — K219 Gastro-esophageal reflux disease without esophagitis: Secondary | ICD-10-CM | POA: Diagnosis not present

## 2021-01-30 DIAGNOSIS — E1165 Type 2 diabetes mellitus with hyperglycemia: Secondary | ICD-10-CM | POA: Diagnosis not present

## 2021-01-30 DIAGNOSIS — I10 Essential (primary) hypertension: Secondary | ICD-10-CM | POA: Diagnosis not present

## 2021-01-30 DIAGNOSIS — Z299 Encounter for prophylactic measures, unspecified: Secondary | ICD-10-CM | POA: Diagnosis not present

## 2021-01-31 DIAGNOSIS — E119 Type 2 diabetes mellitus without complications: Secondary | ICD-10-CM | POA: Diagnosis not present

## 2021-01-31 DIAGNOSIS — E1165 Type 2 diabetes mellitus with hyperglycemia: Secondary | ICD-10-CM | POA: Diagnosis not present

## 2021-02-01 ENCOUNTER — Encounter (INDEPENDENT_AMBULATORY_CARE_PROVIDER_SITE_OTHER): Payer: Medicare Other | Admitting: Ophthalmology

## 2021-02-05 NOTE — Progress Notes (Signed)
Cardiology Office Note  Date: 02/06/2021   ID: Sandra [name], DOB [birth year], MRN [id number]  PCP:  Glenda Chroman, MD  Cardiologist:  None Electrophysiologist:  None   Chief Complaint: 6-week follow-up  History of Present Illness: Sandra [name] is a 80 y.o. female with a history of chronic systolic heart failure, atrial fibrillation, DVT, PE, DM2, SOB, lower extremity edema, palpitations, iron deficiency anemia . She last saw Dr. Harl Bowie on 11/30/2020.  Had some recent lower extremity edema left greater than right with some recent SOB/DOE.  There was progression.  Previous echo in April 2022 with EF of 45%, G1 DD, mild to moderate MR, mild to moderate TR.  Swelling was unchanged per her report but family reported was much improved.  Weight was up from 164 to 170 pounds.  Her shortness of breath was unchanged.  She was continuing Xarelto for anticoagulation due to history of PE.  She was being followed by GI for iron deficiency anemia.  Previous colonoscopy and EGD with polyps.  Plan was to increase Toprol to 100 mg daily, start losartan 25 mg daily.  Continue Lasix.  Weights had trended up since prior visit.  Dr. Harl Bowie mentioned may need to titrate diuretic pending upcoming labs.  Titrate Toprol and losartan as tolerated.  Repeat echo in the next few months if ongoing dysfunction and tolerating ARB would convert to Entresto at that time.  Long history of palpitations with benign ectopy on prior monitors.  Increasing Toprol to 100 mg daily.  Norvasc was being stopped in setting of a systolic dysfunction and starting losartan 25 mg daily.  She is here for 6-week follow-up.  She states she has been having some terrible indigestion over the last 2+ weeks.  She states a week ago she had indigestion so bad she was nauseated.  She states for the last 2 weeks indigestion/chest pain comes and goes with associated nausea and diaphoresis.  She denies any radiation to neck, arm, back, or jaw.  States it  can occur at rest and with some activity although she states she is not very active on a daily basis.  She stays at a local assisted living facility.  She states today has not been a particularly good day.  She feels tired and has some chest pain/heaviness.  She states she has had a stress test in the past but it has been a long time ago.  She states she is compliant with her medications but sometimes she does not receive her medications on a timely basis at the assisted living facility where she resides.  She recently had both upper and lower endoscopies with polyps.  Polyps were removed.  She had a 2 cm hiatal hernia.  She denies any complications after endoscopies.  She is on iron supplementation for anemia.  She denies any blood in her stool but stools are dark due to taking iron supplement.  Blood pressure is elevated today 156/70.  She states she has not felt well daily the entire day with some continuing "indigestion".  We will get an EKG today.    Past Medical History:  Diagnosis Date   Anxiety    Arthritis    "all over; especially in her hands" (01/26/2018)   Atrial fibrillation ([place])    Depression    DVT (deep venous thrombosis), left 04/2011; 2017   after an ankle fracture.: "don't know what caused the one in 2017"   Hypertension    Migraine    "  more often here lately" (01/26/2018)   Mini stroke 2018   "a series of mini strokes" (01/26/2018)   Mitral regurgitation 07/2011   moderate   Pulmonary embolism ([place]) 04/2011   Type II diabetes mellitus ([place])     Past Surgical History:  Procedure Laterality Date   ABDOMINAL HYSTERECTOMY     BIOPSY  11/15/2020   Procedure: BIOPSY;  Surgeon: Harvel Quale, MD;  [facility]: [facility];  Service: Gastroenterology;;   [place] Anes     CATARACT EXTRACTION Bilateral    COLONOSCOPY  05/07/2012   Procedure: COLONOSCOPY;  Surgeon: Rogene Houston, MD;  [facility]: [facility];  Service: Endoscopy;  Laterality: N/A;  245   COLONOSCOPY  WITH PROPOFOL N/A 11/15/2020   Procedure: COLONOSCOPY WITH PROPOFOL;  Surgeon: Harvel Quale, MD;  Location: [facility];  Service: Gastroenterology;  Laterality: N/A;  8:30, facility pt, will need rapid covid test   ESOPHAGOGASTRODUODENOSCOPY (EGD) WITH PROPOFOL N/A 11/15/2020   Procedure: ESOPHAGOGASTRODUODENOSCOPY (EGD) WITH PROPOFOL;  Surgeon: Harvel Quale, MD;  [facility];  Service: Gastroenterology;  Laterality: N/A;   GIVENS CAPSULE STUDY N/A 12/04/2020   Procedure: GIVENS CAPSULE STUDY;  Surgeon: Harvel Quale, MD;  [facility];  Service: Gastroenterology;  Laterality: N/A;  7:30   HEMOSTASIS CLIP PLACEMENT  11/15/2020   Procedure: HEMOSTASIS CLIP PLACEMENT;  Surgeon: Harvel Quale, MD;  [facility];  Service: Gastroenterology;;   HERNIA REPAIR     SUBMUCOSAL TATTOO INJECTION  11/15/2020   Procedure: SUBMUCOSAL TATTOO INJECTION;  Surgeon: Harvel Quale, MD;  [facility];  Service: Gastroenterology;;   TONSILLECTOMY      Current Outpatient Medications  Medication Sig Dispense Refill   isosorbide mononitrate (IMDUR) 30 MG 24 hr tablet Take 1 tablet (30 mg total) by mouth daily. 30 tablet 1   acetaminophen (TYLENOL) 650 MG CR tablet Take 650 mg by mouth every 4 (four) hours as needed for pain.     aspirin 81 MG chewable tablet Chew 81 mg by mouth daily.     atorvastatin (LIPITOR) 80 MG tablet Take 80 mg by mouth daily.     calcium carbonate (TUMS - DOSED IN MG ELEMENTAL CALCIUM) 500 MG chewable tablet Chew 1 tablet by mouth every 8 (eight) hours as needed for indigestion.     cholestyramine (QUESTRAN) 4 g packet Take 4 g by mouth daily.     diphenoxylate-atropine (LOMOTIL) 2.5-0.025 MG tablet Take 1 tablet by mouth daily as needed for diarrhea or loose stools.     famotidine (PEPCID) 20 MG tablet Take 20 mg by mouth daily.     ferrous sulfate 325 (65 FE) MG tablet Take 325 mg by  mouth daily with breakfast.     furosemide (LASIX) 40 MG tablet TAKE 1 TABLET BY MOUTH EVERY DAY 90 tablet 2   Glucose (TRUEPLUS GLUCOSE) 15 GM/32ML GEL Take 15 g by mouth daily as needed (blood sugar less than 60.).     HUMALOG KWIKPEN 100 UNIT/ML KwikPen Inject 15-18 Units into the skin See admin instructions. Inject 18 units subcutaneously in the morning & Inject 15 units subcutaneously in the afternoon & inject 8 units subcutaneously in the evening. Hold if sugar is <115     LANTUS SOLOSTAR 100 UNIT/ML Solostar Pen Inject 30 Units into the skin in the morning and at bedtime.     losartan (COZAAR) 25 MG tablet Take 1 tablet (25 mg total) by mouth daily. [place]  tablet 6   Melatonin 3 MG TBDP Take 3 mg by mouth at bedtime.     metFORMIN (GLUCOPHAGE) 1000 MG tablet Take 1,000 mg by mouth daily.     metoprolol succinate (TOPROL XL) 100 MG 24 hr tablet Take 1 tablet (100 mg total) by mouth daily. 90 tablet 3   nystatin (MYCOSTATIN) 100000 UNIT/ML suspension Use as directed 10 mLs in the mouth or throat daily as needed (DRY MOUTH).     omeprazole (PRILOSEC) 40 MG capsule Take 40 mg by mouth daily.     ondansetron (ZOFRAN) 4 MG tablet Take 4 mg by mouth every 8 (eight) hours as needed for nausea or vomiting.     oxybutynin (DITROPAN-XL) 5 MG 24 hr tablet Take 5 mg by mouth daily.     oxyCODONE-acetaminophen (PERCOCET/ROXICET) 5-325 MG tablet Take 1 tablet by mouth every 12 (twelve) hours as needed for severe pain.     Probiotic Product (ALIGN) 4 MG CAPS Take 4 mg by mouth daily.     psyllium (METAMUCIL) 58.6 % packet Take 1 packet by mouth daily as needed (constipation).     rivaroxaban (XARELTO) 20 MG TABS tablet Take 20 mg by mouth at bedtime.     Semaglutide,0.25 or 0.5MG /DOS, (OZEMPIC, 0.25 OR 0.5 MG/DOSE,) 2 MG/1.5ML SOPN Inject 0.5 mg into the skin every Thursday.     sertraline (ZOLOFT) 50 MG tablet Take 50 mg by mouth at bedtime.     No current facility-administered medications for this visit.    Allergies:  Buprenex [buprenorphine], Namenda [memantine hcl], and Other   Social History: The patient  reports that she has never smoked. She has never used smokeless tobacco. She reports that she does not drink alcohol and does not use drugs.   Family History: The patient's family history includes Diabetes in her mother; Heart attack in her mother; Hypertension in her mother.   ROS:  Please see the history of present illness. Otherwise, complete review of systems is positive for none.  All other systems are reviewed and negative.   Physical Exam: VS:  BP (!) 156/70   Pulse 72   Ht 5\' 3"  (1.6 m)   Wt 168 lb 9.6 oz (76.5 kg)   SpO2 95%   BMI 29.87 kg/m , BMI Body mass index is 29.87 kg/m.  Wt Readings from Last 3 Encounters:  02/06/21 168 lb 9.6 oz (76.5 kg)  12/04/20 164 lb (74.4 kg)  11/30/20 170 lb 9.6 oz (77.4 kg)    General: Patient appears comfortable at rest. Neck: Supple, no elevated JVP or carotid bruits, no thyromegaly. Lungs: Clear to auscultation, nonlabored breathing at rest. Cardiac: Regular rate and rhythm, no S3 or significant systolic murmur, no pericardial rub. Extremities: No pitting edema, distal pulses 2+. Skin: Warm and dry. Musculoskeletal: No kyphosis. Neuropsychiatric: Alert and oriented x3, affect grossly appropriate.  ECG:    Recent Labwork: 11/15/2020: Hemoglobin 11.2; Platelets 121  No results found for: CHOL, TRIG, HDL, CHOLHDL, VLDL, LDLCALC, LDLDIRECT  Other Studies Reviewed Today:   Echo from pcp dated 08/07/2020 LVEF 45%, abnormal diastolic function based on E/A ratio would be grade I. Mild to mod MR, , mild to mod TR.      11/2020 echo A complete portable two-dimensional transthoracic echocardiogram with color  flow Doppler and Spectral Doppler was performed. Saline contrast injection  was performed. The left ventricle is grossly normal size.  Injection of contrast documented no interatrial shunt .  There is borderline left  ventricular hypertrophy.  The left ventricular ejection fraction is normal (60-65%).  The left ventricular wall motion is normal.  Right ventricular systolic pressure is normal.  The aortic valve is trileaflet.  Grade I mild diastolic dysfunction; abnormal relaxation pattern.  There is mild-moderate (1-2+) mitral regurgitation.    Assessment and Plan:  1. Chest pain, unspecified type   2. Chronic systolic heart failure (HCC)   3. Palpitations   4. Essential hypertension    1. Chest pain, unspecified type Complaining of indigestion/chest pain/chest heaviness over the last 2 weeks.  She states last week she had indigestion so bad she became nauseated and vomited.  She continues to have chest discomfort.  She denies any radiation but does states she has nausea and diaphoresis at times.  She is not able to tell if exertion makes it worse.  She is not very active on a daily basis.  Please start Imdur 30 mg daily.  If unable to tolerate 30 mg may break the pill in half and take 15 mg.  Continue aspirin 81 mg daily.  Continue Toprol-XL 100 mg daily.  Continue atorvastatin 80 mg daily.  Please get a Lexiscan stress test to assess for ischemic etiology.    2. Chronic systolic heart failure ([place]) Follow-up echocardiogram in August with bubble study.  Injection of contrast documented no interatrial shunt.  Borderline LVH, EF 60 to 65%, normal LV wall motion, trileaflet aortic valve, G1 DD, abnormal relaxation pattern, mild to moderate 1-2+ MR.  No clinical signs of heart failure.  Denies any shortness of breath or DOE at the moment.  No lower extremity edema.  Weight is staying in 168 to 170 pound range.  Continue Toprol-XL 100 mg daily, losartan 25 mg daily.  Lasix 40 mg daily.  3. Palpitations Currently denies any palpitations.  She does have some frequent PVCs on her EKG today.  EKG today sinus rhythm with marked sinus arrhythmia with frequent premature ventricular complexes rate of 91, left axis  deviation, LVH with repolarization abnormality.  4. Essential hypertension Blood pressure elevated today at 156/70.  Patient states she has been having some indigestion over the last 2 weeks.  This may account for some of the elevation in her blood pressure today.  Continue Toprol-XL 100 mg daily.  Continue losartan 25 mg daily,   Medication Adjustments/Labs and Tests Ordered: Current medicines are reviewed at length with the patient today.  Concerns regarding medicines are outlined above.   Disposition: Follow-up with Dr. Harl Bowie or [facility]  Signed, Levell July, NP 02/06/2021 3:47 PM    [facility] [place], [place], [place] [address] Phone: [phone]; Fax: [phone]

## 2021-02-06 ENCOUNTER — Encounter: Payer: Self-pay | Admitting: *Deleted

## 2021-02-06 ENCOUNTER — Ambulatory Visit (INDEPENDENT_AMBULATORY_CARE_PROVIDER_SITE_OTHER): Payer: Medicare Other | Admitting: Family Medicine

## 2021-02-06 ENCOUNTER — Encounter: Payer: Self-pay | Admitting: Family Medicine

## 2021-02-06 VITALS — BP 156/70 | HR 72 | Ht 63.0 in | Wt 168.6 lb

## 2021-02-06 DIAGNOSIS — I10 Essential (primary) hypertension: Secondary | ICD-10-CM | POA: Diagnosis not present

## 2021-02-06 DIAGNOSIS — R079 Chest pain, unspecified: Secondary | ICD-10-CM

## 2021-02-06 DIAGNOSIS — I5022 Chronic systolic (congestive) heart failure: Secondary | ICD-10-CM | POA: Diagnosis not present

## 2021-02-06 DIAGNOSIS — R002 Palpitations: Secondary | ICD-10-CM

## 2021-02-06 MED ORDER — ISOSORBIDE MONONITRATE ER 30 MG PO TB24: 30.0000 mg | ORAL_TABLET | Freq: Every day | ORAL | 1 refills | Status: DC

## 2021-02-06 NOTE — Patient Instructions (Addendum)
Medication Instructions:  Your physician has recommended you make the following change in your medication:  Start isosorbide mononitrate (imdur) 30 mg daily Continue other medications the same  Labwork: none  Testing/Procedures: Your physician has requested that you have a lexiscan myoview. For further information please visit [email or web address]. Please follow instruction sheet, as given.  Follow-Up: Your physician recommends that you schedule a follow-up appointment in: 2 months  Any Other Special Instructions Will Be Listed Below (If Applicable).  If you need a refill on your cardiac medications before your next appointment, please call your pharmacy.

## 2021-02-08 ENCOUNTER — Ambulatory Visit (INDEPENDENT_AMBULATORY_CARE_PROVIDER_SITE_OTHER): Payer: Medicare Other | Admitting: Ophthalmology

## 2021-02-08 ENCOUNTER — Other Ambulatory Visit: Payer: Self-pay

## 2021-02-08 ENCOUNTER — Encounter (INDEPENDENT_AMBULATORY_CARE_PROVIDER_SITE_OTHER): Payer: Self-pay | Admitting: Ophthalmology

## 2021-02-08 DIAGNOSIS — E113492 Type 2 diabetes mellitus with severe nonproliferative diabetic retinopathy without macular edema, left eye: Secondary | ICD-10-CM | POA: Diagnosis not present

## 2021-02-08 DIAGNOSIS — E113411 Type 2 diabetes mellitus with severe nonproliferative diabetic retinopathy with macular edema, right eye: Secondary | ICD-10-CM

## 2021-02-08 NOTE — Assessment & Plan Note (Signed)
The nature of severe nonproliferative diabetic retinopathy discussed with the patient as well as the need for more frequent follow up and likely progression to proliferative disease in the near future. The options of continued observation versus panretinal photocoagulation at this time were reviewed as well as the risks, benefits, and alternatives. More recent option includes the use of ocular injectable medications to slow progression of retinal disease. Tight control of glucose, blood pressure, and serum lipid levels were recommended under the direction of general physician or endocrinologist, as well as avoidance of smoking and maintenance of normal body weight. The 2-year risk of progression to proliferative diabetic retinopathy is 60%.

## 2021-02-08 NOTE — Assessment & Plan Note (Signed)
OS with noncentral involved CSME inferotemporal we will need to proceed with focal laser treatment for this region.  Patient understands critical importance of good blood sugar control (not perfect) so as to slow down further progression of diabetic eye diseaseThe nature of severe nonproliferative diabetic retinopathy discussed with the patient as well as the need for more frequent follow up and likely progression to proliferative disease in the near future. The options of continued observation versus panretinal photocoagulation at this time were reviewed as well as the risks, benefits, and alternatives. More recent option includes the use of ocular injectable medications to slow progression of retinal disease. Tight control of glucose, blood pressure, and serum lipid levels were recommended under the direction of general physician or endocrinologist, as well as avoidance of smoking and maintenance of normal body weight. The 2-year risk of progression to proliferative diabetic retinopathy is 60%.   The nature of diabetic retinopathy was explained using the following analogy: "Retinopathy develops in the body's blood supply like salty water corrodes the linings of pipes in a house, until rust appears, then holes in the pipes develop which leak followed by destruction and loss of the pipes as the corrosion turns them to dust. In a similar fashion, Diabetes damages the blood supply of the body by cumulative long--term elevated blood sugar, which corrodes the blood supply in the body, particularly the blood vessels supplying the retina, kidneys, and nerves".  Thus, control of blood sugar, slows the progression of the corrosive effect of diabetes mellitus.

## 2021-02-08 NOTE — Progress Notes (Signed)
02/08/2021     CHIEF COMPLAINT Patient presents for  Chief Complaint  Patient presents with   Retina Evaluation      HISTORY OF PRESENT ILLNESS: Sandra [name] is a 80 y.o. female who presents to the clinic today for:   HPI     Retina Evaluation   In both eyes.  Associated Symptoms Floaters.  Negative for Flashes, Distortion, Blind Spot, Pain, Redness, Photophobia and Glare.  Context:  distance vision and near vision.  Treatments tried include no treatments.        Comments   NP- Diabetic Retinopathy OU, referred by Dr Mayford Knife. Pt states "From the middle of the day to the end of the day, I can't read the newspaper, it is blurred. At night I can't see in the dark. That is why I went to see Dr Hassell Done. I have had floaters for years." Denies new FOL or floaters. A1C: Pt states "6.5 or 7.0" LBS: Pt states "it was over 200, it is usually over 200."      Last edited by Laurin Coder on 02/08/2021  2:07 PM.      Referring physician: Okey Regal, [place],  [place] [address]  HISTORICAL INFORMATION:   Selected notes from the MEDICAL RECORD NUMBER    Lab Results  Component Value Date   HGBA1C 8.5 (H) 01/23/2018     CURRENT MEDICATIONS: No current outpatient medications on file. (Ophthalmic Drugs)   No current facility-administered medications for this visit. (Ophthalmic Drugs)   Current Outpatient Medications (Other)  Medication Sig   acetaminophen (TYLENOL) 650 MG CR tablet Take 650 mg by mouth every 4 (four) hours as needed for pain.   aspirin 81 MG chewable tablet Chew 81 mg by mouth daily.   atorvastatin (LIPITOR) 80 MG tablet Take 80 mg by mouth daily.   calcium carbonate (TUMS - DOSED IN MG ELEMENTAL CALCIUM) 500 MG chewable tablet Chew 1 tablet by mouth every 8 (eight) hours as needed for indigestion.   cholestyramine (QUESTRAN) 4 g packet Take 4 g by mouth daily.   diphenoxylate-atropine (LOMOTIL) 2.5-0.025 MG tablet Take 1 tablet by  mouth daily as needed for diarrhea or loose stools.   famotidine (PEPCID) 20 MG tablet Take 20 mg by mouth daily.   ferrous sulfate 325 (65 FE) MG tablet Take 325 mg by mouth daily with breakfast.   furosemide (LASIX) 40 MG tablet TAKE 1 TABLET BY MOUTH EVERY DAY   Glucose (TRUEPLUS GLUCOSE) 15 GM/32ML GEL Take 15 g by mouth daily as needed (blood sugar less than 60.).   HUMALOG KWIKPEN 100 UNIT/ML KwikPen Inject 15-18 Units into the skin See admin instructions. Inject 18 units subcutaneously in the morning & Inject 15 units subcutaneously in the afternoon & inject 8 units subcutaneously in the evening. Hold if sugar is <115   isosorbide mononitrate (IMDUR) 30 MG 24 hr tablet Take 1 tablet (30 mg total) by mouth daily.   LANTUS SOLOSTAR 100 UNIT/ML Solostar Pen Inject 30 Units into the skin in the morning and at bedtime.   losartan (COZAAR) 25 MG tablet Take 1 tablet (25 mg total) by mouth daily.   Melatonin 3 MG TBDP Take 3 mg by mouth at bedtime.   metFORMIN (GLUCOPHAGE) 1000 MG tablet Take 1,000 mg by mouth daily.   metoprolol succinate (TOPROL XL) 100 MG 24 hr tablet Take 1 tablet (100 mg total) by mouth daily.   nystatin (MYCOSTATIN) 100000 UNIT/ML suspension  Use as directed 10 mLs in the mouth or throat daily as needed (DRY MOUTH).   omeprazole (PRILOSEC) 40 MG capsule Take 40 mg by mouth daily.   ondansetron (ZOFRAN) 4 MG tablet Take 4 mg by mouth every 8 (eight) hours as needed for nausea or vomiting.   oxybutynin (DITROPAN-XL) 5 MG 24 hr tablet Take 5 mg by mouth daily.   oxyCODONE-acetaminophen (PERCOCET/ROXICET) 5-325 MG tablet Take 1 tablet by mouth every 12 (twelve) hours as needed for severe pain.   Probiotic Product (ALIGN) 4 MG CAPS Take 4 mg by mouth daily.   psyllium (METAMUCIL) 58.6 % packet Take 1 packet by mouth daily as needed (constipation).   rivaroxaban (XARELTO) 20 MG TABS tablet Take 20 mg by mouth at bedtime.   Semaglutide,0.25 or 0.5MG /DOS, (OZEMPIC, 0.25 OR 0.5  MG/DOSE,) 2 MG/1.5ML SOPN Inject 0.5 mg into the skin every Thursday.   sertraline (ZOLOFT) 50 MG tablet Take 50 mg by mouth at bedtime.   No current facility-administered medications for this visit. (Other)      REVIEW OF SYSTEMS:    ALLERGIES Allergies  Allergen Reactions   Buprenex [Buprenorphine] Nausea And Vomiting   Namenda [Memantine Hcl] Other (See Comments)    Sores in the mouth   Other Other (See Comments)    Shot for pain caused severe nausea. Had to spend the night in the hospital. Shot to numb mouth at the dentist = made her feel like she was smothering.     PAST MEDICAL HISTORY Past Medical History:  Diagnosis Date   Anxiety    Arthritis    "all over; especially in her hands" (01/26/2018)   Atrial fibrillation ([place])    Depression    DVT (deep venous thrombosis), left 04/2011; 2017   after an ankle fracture.: "don't know what caused the one in 2017"   Hypertension    Migraine    "more often here lately" (01/26/2018)   Mini stroke 2018   "a series of mini strokes" (01/26/2018)   Mitral regurgitation 07/2011   moderate   Pulmonary embolism ([place]) 04/2011   Type II diabetes mellitus ([place])    Past Surgical History:  Procedure Laterality Date   ABDOMINAL HYSTERECTOMY     BIOPSY  11/15/2020   Procedure: BIOPSY;  Surgeon: Harvel Quale, MD;  [facility];  Service: Gastroenterology;;   [place] Anes     CATARACT EXTRACTION Bilateral    COLONOSCOPY  05/07/2012   Procedure: COLONOSCOPY;  Surgeon: Rogene Houston, MD;  [facility];  Service: Endoscopy;  Laterality: N/A;  245   COLONOSCOPY WITH PROPOFOL N/A 11/15/2020   Procedure: COLONOSCOPY WITH PROPOFOL;  Surgeon: Harvel Quale, MD;  [facility];  Service: Gastroenterology;  Laterality: N/A;  8:30, facility pt, will need rapid covid test   ESOPHAGOGASTRODUODENOSCOPY (EGD) WITH PROPOFOL N/A 11/15/2020   Procedure: ESOPHAGOGASTRODUODENOSCOPY (EGD) WITH  PROPOFOL;  Surgeon: Harvel Quale, MD;  [facility];  Service: Gastroenterology;  Laterality: N/A;   GIVENS CAPSULE STUDY N/A 12/04/2020   Procedure: GIVENS CAPSULE STUDY;  Surgeon: Harvel Quale, MD;  [facility];  Service: Gastroenterology;  Laterality: N/A;  7:30   HEMOSTASIS CLIP PLACEMENT  11/15/2020   Procedure: HEMOSTASIS CLIP PLACEMENT;  Surgeon: Harvel Quale, MD;  [facility];  Service: Gastroenterology;;   [place] INJECTION  11/15/2020   Procedure: SUBMUCOSAL TATTOO INJECTION;  Surgeon: Harvel Quale, MD;  [facility];  Service: Gastroenterology;;   TONSILLECTOMY      FAMILY HISTORY Family History  Problem Relation Age of Onset   Hypertension Mother    Diabetes Mother    Heart attack Mother     SOCIAL HISTORY Social History   Tobacco Use   Smoking status: Never   Smokeless tobacco: Never  Vaping Use   Vaping Use: Never used  Substance Use Topics   Alcohol use: Never   Drug use: Never         OPHTHALMIC EXAM:  Base Eye Exam     Visual Acuity (ETDRS)       Right Left   Dist cc 20/25 -2 20/30         Tonometry (Tonopen, 2:17 PM)       Right Left   Pressure 12 11         Pupils       Pupils Dark Light Shape React APD   Right PERRL 3 3 Round Minimal None   Left PERRL 3 3 Round Minimal None         Visual Fields (Counting fingers)       Left Right    Full Full         Extraocular Movement       Right Left    Full Full         Neuro/Psych     Oriented x3: Yes   Mood/Affect: Normal         Dilation     Both eyes: 1.0% Mydriacyl, 2.5% Phenylephrine @ 2:17 PM           Slit Lamp and Fundus Exam     External Exam       Right Left   External Normal Normal         Slit Lamp Exam       Right Left   Lids/Lashes Normal Normal   Conjunctiva/Sclera White and quiet White and quiet   Cornea Clear  Clear   Anterior Chamber Deep and quiet Deep and quiet   Iris Round and reactive Round and reactive   Lens Centered posterior chamber intraocular lens Centered posterior chamber intraocular lens   Anterior Vitreous Normal Normal         Fundus Exam       Right Left   Posterior Vitreous Normal Normal   Disc Normal Normal   C/D Ratio 0.35 0.35   Macula Microaneurysms, Macular thickening, Mild clinically significant macular edema Microaneurysms, no macular thickening   Vessels NPDR-Severe NPDR-Severe   Periphery Normal Normal            IMAGING AND PROCEDURES  Imaging and Procedures for 02/08/21  OCT, Retina - OU - Both Eyes       Right Eye Quality was good. Scan locations included subfoveal. Central Foveal Thickness: 250. Progression has no prior data. Findings include cystoid macular edema.   Left Eye Quality was good. Scan locations included subfoveal. Central Foveal Thickness: 240. Progression has no prior data.   Notes Inferotemporal noncentral involved CSME OD.  OS no CSME     Color Fundus Photography Optos - OU - Both Eyes       Right Eye Progression has no prior data. Disc findings include normal observations. Macula : microaneurysms. Periphery : normal observations.   Left Eye Progression has no prior data. Disc findings include normal observations. Macula : microaneurysms.   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  Patient understands critical importance of good blood sugar control (not perfect) so as to slow down further progression of diabetic eye diseaseThe nature of severe nonproliferative diabetic retinopathy discussed with the patient as well as the  need for more frequent follow up and likely progression to proliferative disease in the near future. The options of continued observation versus panretinal photocoagulation at this time were reviewed as well as the risks, benefits, and alternatives. More recent option includes the use of ocular injectable medications to slow progression of retinal disease. Tight control of glucose, blood pressure, and serum lipid levels were recommended under the direction of general physician or endocrinologist, as well as avoidance of smoking and maintenance of normal body weight. The 2-year risk of progression to proliferative diabetic retinopathy is 60%.   The nature of diabetic retinopathy was explained using the following analogy: "Retinopathy develops in the body's blood supply like salty water corrodes the linings of pipes in a house, until rust appears, then holes in the pipes develop which leak followed by destruction and loss of the pipes as the corrosion turns them to dust. In a similar fashion, Diabetes damages the blood supply of the body by cumulative long--term elevated blood sugar, which corrodes the blood supply in the body, particularly the blood vessels supplying the retina, kidneys, and nerves".  Thus, control of blood sugar, slows the progression of the corrosive effect of diabetes mellitus.     Severe nonproliferative diabetic retinopathy of left eye without macular edema associated with type 2 diabetes mellitus ([place]) The nature of severe nonproliferative diabetic retinopathy discussed with the patient as well as the need for more frequent follow up and likely progression to proliferative disease in the near future. The options of continued observation versus panretinal photocoagulation at this time were reviewed as well as the risks, benefits, and alternatives. More recent option includes the use of ocular injectable medications to slow progression of retinal disease. Tight control of glucose, blood  pressure, and serum lipid levels were recommended under the direction of general physician or endocrinologist, as well as avoidance of smoking and maintenance of normal body weight. The 2-year risk of progression to proliferative diabetic retinopathy is 60%.      ICD-10-CM   1. 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 Severe nonproliferative diabetic retinopathy of left eye without macular edema associated with type 2 diabetes mellitus (HCC)  U54.2706 OCT, Retina - OU - Both Eyes    Color Fundus Photography Optos - OU - Both Eyes      1.  OD with noncentral involved CSME will treat conservatively with focal laser treatment to decrease this patient's treatment burden.  2.  Bilateral severe NPDR may in fact need peripheral anterior PRP as we will perform fluorescein angiography upon her next visit  3.  Ophthalmic Meds Ordered this visit:  No orders of the defined types were placed in this encounter.      Return in about 2 weeks (around 02/22/2021) for DILATE  OU, OPTOS FFA R/L, COLOR FP, FOCAL, OD.  There are no Patient Instructions on file for this visit.   Explained the diagnoses, plan, and follow up with the patient and they expressed understanding.  Patient expressed understanding of the importance of proper follow up care.   Clent Demark Maisen Schmit M.D. Diseases & Surgery of the Retina and Vitreous [facility] Diabetic [place] 02/08/21     Abbreviations: M myopia (nearsighted); A astigmatism; H hyperopia (farsighted); P presbyopia; Mrx spectacle prescription;  CTL contact lenses; OD right eye; OS left eye; OU both eyes  XT exotropia; ET esotropia; PEK punctate epithelial keratitis; PEE punctate epithelial erosions; DES dry eye syndrome; MGD meibomian gland dysfunction; ATs artificial tears; PFAT's  preservative free artificial tears; [place] nuclear sclerotic cataract; PSC posterior subcapsular cataract; ERM epi-retinal membrane; PVD posterior vitreous detachment; RD retinal detachment; DM diabetes mellitus; DR diabetic retinopathy; NPDR non-proliferative diabetic retinopathy; PDR proliferative diabetic retinopathy; CSME clinically significant macular edema; DME diabetic macular edema; dbh dot blot hemorrhages; CWS cotton wool spot; POAG primary open angle glaucoma; C/D cup-to-disc ratio; HVF humphrey visual field; GVF goldmann visual field; OCT optical coherence tomography; IOP intraocular pressure; BRVO Branch retinal vein occlusion; CRVO central retinal vein occlusion; CRAO central retinal artery occlusion; BRAO branch retinal artery occlusion; RT retinal tear; SB scleral buckle; PPV pars plana vitrectomy; VH Vitreous hemorrhage; PRP panretinal laser photocoagulation; IVK intravitreal kenalog; VMT vitreomacular traction; MH Macular hole;  NVD neovascularization of the disc; NVE neovascularization elsewhere; AREDS age related eye disease study; ARMD age related macular degeneration; POAG primary open angle glaucoma; EBMD epithelial/anterior basement membrane dystrophy; ACIOL anterior chamber intraocular lens; IOL intraocular lens; PCIOL posterior chamber intraocular lens; Phaco/IOL phacoemulsification with intraocular lens placement; [place] photorefractive keratectomy; LASIK laser assisted in situ keratomileusis; HTN hypertension; DM diabetes mellitus; COPD chronic obstructive pulmonary disease

## 2021-02-15 ENCOUNTER — Encounter (HOSPITAL_COMMUNITY): Payer: Medicare Other

## 2021-02-22 ENCOUNTER — Ambulatory Visit (HOSPITAL_COMMUNITY)
Admission: RE | Admit: 2021-02-22 | Discharge: 2021-02-22 | Disposition: A | Discharge status: Home / Self Care | Payer: Medicare Other | Source: Ambulatory Visit | Attending: Family Medicine | Admitting: Family Medicine

## 2021-02-22 ENCOUNTER — Other Ambulatory Visit: Payer: Self-pay

## 2021-02-22 ENCOUNTER — Encounter (HOSPITAL_COMMUNITY)
Admission: RE | Admit: 2021-02-22 | Discharge: 2021-02-22 | Disposition: A | Discharge status: Home / Self Care | Payer: Medicare Other | Source: Ambulatory Visit | Attending: Family Medicine | Admitting: Family Medicine

## 2021-02-22 ENCOUNTER — Encounter (HOSPITAL_COMMUNITY): Payer: Self-pay

## 2021-02-22 DIAGNOSIS — R079 Chest pain, unspecified: Secondary | ICD-10-CM | POA: Diagnosis not present

## 2021-02-22 HISTORY — DX: Malignant (primary) neoplasm, unspecified: C80.1

## 2021-02-22 LAB — NM MYOCAR MULTI W/SPECT W/WALL MOTION / EF
LV dias vol: 86 mL (ref 46–106)
LV sys vol: 49 mL
Nuc Stress EF: 43 %
Peak HR: 117 {beats}/min
RATE: 0.3
Rest HR: 84 {beats}/min
Rest Nuclear Isotope Dose: 11 mCi
SDS: 0
SRS: 1
SSS: 1
ST Depression (mm): 0.5 mm
Stress Nuclear Isotope Dose: 33 mCi
TID: 1.01

## 2021-02-22 MED ORDER — TECHNETIUM TC 99M TETROFOSMIN IV KIT
10.0000 | PACK | Freq: Once | INTRAVENOUS | Status: AC | PRN
  Administered 2021-02-22: 11 via INTRAVENOUS

## 2021-02-22 MED ORDER — REGADENOSON 0.4 MG/5ML IV SOLN
INTRAVENOUS | Status: AC
  Administered 2021-02-22: 0.4 mg via INTRAVENOUS
  Filled 2021-02-22: qty 5

## 2021-02-22 MED ORDER — TECHNETIUM TC 99M TETROFOSMIN IV KIT
30.0000 | PACK | Freq: Once | INTRAVENOUS | Status: AC | PRN
  Administered 2021-02-22: 33 via INTRAVENOUS

## 2021-02-22 MED ORDER — SODIUM CHLORIDE FLUSH 0.9 % IV SOLN
INTRAVENOUS | Status: AC
  Administered 2021-02-22: 10 mL via INTRAVENOUS
  Filled 2021-02-22: qty 10

## 2021-02-24 DIAGNOSIS — I517 Cardiomegaly: Secondary | ICD-10-CM | POA: Diagnosis not present

## 2021-02-24 DIAGNOSIS — Z743 Need for continuous supervision: Secondary | ICD-10-CM | POA: Diagnosis not present

## 2021-02-24 DIAGNOSIS — R531 Weakness: Secondary | ICD-10-CM | POA: Diagnosis not present

## 2021-02-24 DIAGNOSIS — Z86718 Personal history of other venous thrombosis and embolism: Secondary | ICD-10-CM | POA: Diagnosis not present

## 2021-02-24 DIAGNOSIS — Z8673 Personal history of transient ischemic attack (TIA), and cerebral infarction without residual deficits: Secondary | ICD-10-CM | POA: Diagnosis not present

## 2021-02-24 DIAGNOSIS — R404 Transient alteration of awareness: Secondary | ICD-10-CM | POA: Diagnosis not present

## 2021-02-24 DIAGNOSIS — R9431 Abnormal electrocardiogram [ECG] [EKG]: Secondary | ICD-10-CM | POA: Diagnosis not present

## 2021-02-24 DIAGNOSIS — K449 Diaphragmatic hernia without obstruction or gangrene: Secondary | ICD-10-CM | POA: Diagnosis not present

## 2021-02-24 DIAGNOSIS — I2510 Atherosclerotic heart disease of native coronary artery without angina pectoris: Secondary | ICD-10-CM | POA: Diagnosis not present

## 2021-02-24 DIAGNOSIS — R0602 Shortness of breath: Secondary | ICD-10-CM | POA: Diagnosis not present

## 2021-02-24 DIAGNOSIS — R0600 Dyspnea, unspecified: Secondary | ICD-10-CM | POA: Diagnosis not present

## 2021-02-24 DIAGNOSIS — I491 Atrial premature depolarization: Secondary | ICD-10-CM | POA: Diagnosis not present

## 2021-02-24 DIAGNOSIS — R69 Illness, unspecified: Secondary | ICD-10-CM | POA: Diagnosis not present

## 2021-02-24 DIAGNOSIS — R5381 Other malaise: Secondary | ICD-10-CM | POA: Diagnosis not present

## 2021-02-24 DIAGNOSIS — Z9049 Acquired absence of other specified parts of digestive tract: Secondary | ICD-10-CM | POA: Diagnosis not present

## 2021-02-24 DIAGNOSIS — I509 Heart failure, unspecified: Secondary | ICD-10-CM | POA: Diagnosis not present

## 2021-02-24 DIAGNOSIS — E119 Type 2 diabetes mellitus without complications: Secondary | ICD-10-CM | POA: Diagnosis not present

## 2021-02-24 DIAGNOSIS — Z20822 Contact with and (suspected) exposure to covid-19: Secondary | ICD-10-CM | POA: Diagnosis not present

## 2021-02-24 DIAGNOSIS — I700 Atherosclerosis of aorta: Secondary | ICD-10-CM | POA: Diagnosis not present

## 2021-02-24 DIAGNOSIS — I110 Hypertensive heart disease with heart failure: Secondary | ICD-10-CM | POA: Diagnosis not present

## 2021-02-24 DIAGNOSIS — R42 Dizziness and giddiness: Secondary | ICD-10-CM | POA: Diagnosis not present

## 2021-02-26 ENCOUNTER — Encounter (INDEPENDENT_AMBULATORY_CARE_PROVIDER_SITE_OTHER): Payer: Self-pay | Admitting: Ophthalmology

## 2021-02-26 ENCOUNTER — Encounter (INDEPENDENT_AMBULATORY_CARE_PROVIDER_SITE_OTHER): Payer: Medicare Other | Admitting: Ophthalmology

## 2021-02-26 ENCOUNTER — Other Ambulatory Visit: Payer: Self-pay

## 2021-02-26 ENCOUNTER — Ambulatory Visit (INDEPENDENT_AMBULATORY_CARE_PROVIDER_SITE_OTHER): Payer: Medicare Other | Admitting: Ophthalmology

## 2021-02-26 DIAGNOSIS — E113411 Type 2 diabetes mellitus with severe nonproliferative diabetic retinopathy with macular edema, right eye: Secondary | ICD-10-CM | POA: Diagnosis not present

## 2021-02-26 DIAGNOSIS — E1165 Type 2 diabetes mellitus with hyperglycemia: Secondary | ICD-10-CM | POA: Diagnosis not present

## 2021-02-26 DIAGNOSIS — E113492 Type 2 diabetes mellitus with severe nonproliferative diabetic retinopathy without macular edema, left eye: Secondary | ICD-10-CM | POA: Diagnosis not present

## 2021-02-26 DIAGNOSIS — D485 Neoplasm of uncertain behavior of skin: Secondary | ICD-10-CM | POA: Diagnosis not present

## 2021-02-26 DIAGNOSIS — I509 Heart failure, unspecified: Secondary | ICD-10-CM | POA: Diagnosis not present

## 2021-02-26 DIAGNOSIS — Z299 Encounter for prophylactic measures, unspecified: Secondary | ICD-10-CM | POA: Diagnosis not present

## 2021-02-26 DIAGNOSIS — I10 Essential (primary) hypertension: Secondary | ICD-10-CM | POA: Diagnosis not present

## 2021-02-26 NOTE — Assessment & Plan Note (Signed)
Local CSME wound to be treated both today, not center involved, center threatening.  Laser treatment delivered today  Will continue to monitor for severe NPDR progression

## 2021-02-26 NOTE — Progress Notes (Signed)
02/26/2021     CHIEF COMPLAINT Patient presents for  Chief Complaint  Patient presents with   Retina Follow Up      HISTORY OF PRESENT ILLNESS: Sandra [name] is a 80 y.o. female who presents to the clinic today for:   HPI     Retina Follow Up   Patient presents with  Diabetic Retinopathy.  In right eye.  This started 2 weeks ago.  Severity is mild.  Duration of 2 weeks.  Since onset it is gradually worsening.        Comments   2 week fu fp/ffa/ r/l oct and focal od Pt states, "I can tell that my vision is more blurred even just in the last two weeks that I was here. It seems to be getting worse." Denies any new floaters, stable and old ones still present. Denies any FOL.  A1C: 7.0 LBS: 200 and something      Last edited by Sandra [name], COA on 02/26/2021  2:45 PM.      Referring physician: Glenda Chroman, MD [name],  [place] [address]  HISTORICAL INFORMATION:   Selected notes from the MEDICAL RECORD NUMBER    Lab Results  Component Value Date   HGBA1C 8.5 (H) 01/23/2018     CURRENT MEDICATIONS: No current outpatient medications on file. (Ophthalmic Drugs)   No current facility-administered medications for this visit. (Ophthalmic Drugs)   Current Outpatient Medications (Other)  Medication Sig   acetaminophen (TYLENOL) 650 MG CR tablet Take 650 mg by mouth every 4 (four) hours as needed for pain.   aspirin 81 MG chewable tablet Chew 81 mg by mouth daily.   atorvastatin (LIPITOR) 80 MG tablet Take 80 mg by mouth daily.   calcium carbonate (TUMS - DOSED IN MG ELEMENTAL CALCIUM) 500 MG chewable tablet Chew 1 tablet by mouth every 8 (eight) hours as needed for indigestion.   cholestyramine (QUESTRAN) 4 g packet Take 4 g by mouth daily.   diphenoxylate-atropine (LOMOTIL) 2.5-0.025 MG tablet Take 1 tablet by mouth daily as needed for diarrhea or loose stools.   famotidine (PEPCID) 20 MG tablet Take 20 mg by mouth daily.   ferrous sulfate 325 (65 FE) MG  tablet Take 325 mg by mouth daily with breakfast.   furosemide (LASIX) 40 MG tablet TAKE 1 TABLET BY MOUTH EVERY DAY   Glucose (TRUEPLUS GLUCOSE) 15 GM/32ML GEL Take 15 g by mouth daily as needed (blood sugar less than 60.).   HUMALOG KWIKPEN 100 UNIT/ML KwikPen Inject 15-18 Units into the skin See admin instructions. Inject 18 units subcutaneously in the morning & Inject 15 units subcutaneously in the afternoon & inject 8 units subcutaneously in the evening. Hold if sugar is <115   isosorbide mononitrate (IMDUR) 30 MG 24 hr tablet Take 1 tablet (30 mg total) by mouth daily.   LANTUS SOLOSTAR 100 UNIT/ML Solostar Pen Inject 30 Units into the skin in the morning and at bedtime.   losartan (COZAAR) 25 MG tablet Take 1 tablet (25 mg total) by mouth daily.   Melatonin 3 MG TBDP Take 3 mg by mouth at bedtime.   metFORMIN (GLUCOPHAGE) 1000 MG tablet Take 1,000 mg by mouth daily.   metoprolol succinate (TOPROL XL) 100 MG 24 hr tablet Take 1 tablet (100 mg total) by mouth daily.   nystatin (MYCOSTATIN) 100000 UNIT/ML suspension Use as directed 10 mLs in the mouth or throat daily as needed (DRY MOUTH).   omeprazole (PRILOSEC)  40 MG capsule Take 40 mg by mouth daily.   ondansetron (ZOFRAN) 4 MG tablet Take 4 mg by mouth every 8 (eight) hours as needed for nausea or vomiting.   oxybutynin (DITROPAN-XL) 5 MG 24 hr tablet Take 5 mg by mouth daily.   oxyCODONE-acetaminophen (PERCOCET/ROXICET) 5-325 MG tablet Take 1 tablet by mouth every 12 (twelve) hours as needed for severe pain.   Probiotic Product (ALIGN) 4 MG CAPS Take 4 mg by mouth daily.   psyllium (METAMUCIL) 58.6 % packet Take 1 packet by mouth daily as needed (constipation).   rivaroxaban (XARELTO) 20 MG TABS tablet Take 20 mg by mouth at bedtime.   Semaglutide,0.25 or 0.5MG /DOS, (OZEMPIC, 0.25 OR 0.5 MG/DOSE,) 2 MG/1.5ML SOPN Inject 0.5 mg into the skin every Thursday.   sertraline (ZOLOFT) 50 MG tablet Take 50 mg by mouth at bedtime.   No current  facility-administered medications for this visit. (Other)      REVIEW OF SYSTEMS:    ALLERGIES Allergies  Allergen Reactions   Buprenex [Buprenorphine] Nausea And Vomiting   Namenda [Memantine Hcl] Other (See Comments)    Sores in the mouth   Other Other (See Comments)    Shot for pain caused severe nausea. Had to spend the night in the hospital. Shot to numb mouth at the dentist = made her feel like she was smothering.     PAST MEDICAL HISTORY Past Medical History:  Diagnosis Date   Anxiety    Arthritis    "all over; especially in her hands" (01/26/2018)   Atrial fibrillation ([place])    Cancer ([place])    Depression    DVT (deep venous thrombosis), left 04/2011; 2017   after an ankle fracture.: "don't know what caused the one in 2017"   Hypertension    Migraine    "more often here lately" (01/26/2018)   Mini stroke 2018   "a series of mini strokes" (01/26/2018)   Mitral regurgitation 07/2011   moderate   Pulmonary embolism ([place]) 04/2011   Type II diabetes mellitus ([place])    Past Surgical History:  Procedure Laterality Date   ABDOMINAL HYSTERECTOMY     BIOPSY  11/15/2020   Procedure: BIOPSY;  Surgeon: Sandra Quale, MD;  [facility];  Service: Gastroenterology;;   Sandra [name]     CATARACT EXTRACTION Bilateral    COLONOSCOPY  05/07/2012   Procedure: COLONOSCOPY;  Surgeon: Sandra Houston, MD;  [facility];  Service: Endoscopy;  Laterality: N/A;  245   COLONOSCOPY WITH PROPOFOL N/A 11/15/2020   Procedure: COLONOSCOPY WITH PROPOFOL;  Surgeon: Sandra Quale, MD;  [facility];  Service: Gastroenterology;  Laterality: N/A;  8:30, facility pt, will need rapid covid test   ESOPHAGOGASTRODUODENOSCOPY (EGD) WITH PROPOFOL N/A 11/15/2020   Procedure: ESOPHAGOGASTRODUODENOSCOPY (EGD) WITH PROPOFOL;  Surgeon: Sandra Quale, MD;  [facility];  Service: Gastroenterology;  Laterality: N/A;   GIVENS CAPSULE  STUDY N/A 12/04/2020   Procedure: GIVENS CAPSULE STUDY;  Surgeon: Sandra Quale, MD;  [facility];  Service: Gastroenterology;  Laterality: N/A;  7:30   HEMOSTASIS CLIP PLACEMENT  11/15/2020   Procedure: HEMOSTASIS CLIP PLACEMENT;  Surgeon: Sandra Quale, MD;  [facility];  Service: Gastroenterology;;   HERNIA REPAIR     SUBMUCOSAL TATTOO INJECTION  11/15/2020   Procedure: SUBMUCOSAL TATTOO INJECTION;  Surgeon: Sandra Quale, MD;  [facility];  Service: Gastroenterology;;   TONSILLECTOMY  FAMILY HISTORY Family History  Problem Relation Age of Onset   Hypertension Mother    Diabetes Mother    Heart attack Mother     SOCIAL HISTORY Social History   Tobacco Use   Smoking status: Never   Smokeless tobacco: Never  Vaping Use   Vaping Use: Never used  Substance Use Topics   Alcohol use: Never   Drug use: Never         OPHTHALMIC EXAM:  Base Eye Exam     Visual Acuity (ETDRS)       Right Left   Dist cc 20/25 -2 20/40 +1         Tonometry (Tonopen, 2:51 PM)       Right Left   Pressure 13 13         Pupils       Pupils Dark Light Shape React APD   Right PERRL 3 3 Round Minimal None   Left PERRL 3 3 Round Minimal None         Visual Fields (Counting fingers)       Left Right    Full Full         Extraocular Movement       Right Left    Full Full         Neuro/Psych     Oriented x3: Yes   Mood/Affect: Normal         Dilation     Both eyes: 1.0% Mydriacyl, 2.5% Phenylephrine @ 2:51 PM           Slit Lamp and Fundus Exam     External Exam       Right Left   External Normal Normal         Slit Lamp Exam       Right Left   Lids/Lashes Normal Normal   Conjunctiva/Sclera White and quiet White and quiet   Cornea Clear Clear   Anterior Chamber Deep and quiet Deep and quiet   Iris Round and reactive Round and reactive   Lens Centered posterior chamber  intraocular lens Centered posterior chamber intraocular lens   Anterior Vitreous Normal Normal         Fundus Exam       Right Left   Posterior Vitreous Normal Normal   Disc Normal Normal   C/D Ratio 0.35 0.35   Macula Microaneurysms, Macular thickening, Mild clinically significant macular edema Microaneurysms, no macular thickening   Vessels NPDR-Severe NPDR-Severe   Periphery Normal Normal            IMAGING AND PROCEDURES  Imaging and Procedures for 03/07/21  OCT, Retina - OU - Both Eyes       Right Eye Quality was good. Scan locations included subfoveal. Central Foveal Thickness: 253. Progression has been stable. Findings include abnormal foveal contour.   Left Eye Quality was good. Scan locations included subfoveal. Central Foveal Thickness: 241. Progression has been stable. Findings include normal foveal contour.   Notes Focal CSME, inferotemporal to faz, will treat with focal today     Color Fundus Photography Optos - OU - Both Eyes       Right Eye Progression has no prior data. Disc findings include normal observations. Macula : microaneurysms. Periphery : normal observations.   Left Eye Progression has no prior data. Disc findings include normal observations. Macula : microaneurysms.   Notes Severe NPDR in each quadrant each eye OU likely CSME OD     Fluorescein Angiography  Optos (Transit OD)       Injection: 500 mg Fluorescein Sodium 10 %   Route: Intravenous   NDC: [phone]   Right Eye   Early phase findings include microaneurysm. Mid/Late phase findings include leakage. Choroidal neovascularization is not present.   Left Eye Mid/Late phase findings include microaneurysm. Choroidal neovascularization is not present.      Focal Laser - OD - Right Eye       Time Out Confirmed correct patient, procedure, site, and patient consented.   Anesthesia Topical anesthesia was used. Anesthetic medications included Proparacaine 0.5%.    Laser Information The type of laser was diode. Color was yellow. The duration in seconds was 0.1. The spot size was 100 microns. Laser power was 50.   Post-op The patient tolerated the procedure well. There were no complications. The patient received written and verbal post procedure care education.   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  Laser treatment delivered today  Will continue to monitor for severe NPDR progression  Severe nonproliferative diabetic retinopathy of left eye without macular edema associated with type 2 diabetes mellitus (HCC) Severe NPDR presents confirmed today by FFA, will continue to monitor     ICD-10-CM   1. 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 Severe nonproliferative diabetic retinopathy of left eye without macular edema associated with type 2 diabetes mellitus (HCC)  E11.3492 OCT, Retina - OU - Both Eyes    Color Fundus Photography Optos - OU - Both Eyes    Fluorescein Angiography Optos (Transit OD)    Fluorescein Sodium 10 % injection 500 mg      1.  OU with severe NPDR  2.  OD with CSME inferotemporal to [place] center threatening.  Focal laser treatment delivered  3.  Ophthalmic Meds Ordered this visit:  Meds ordered this encounter  Medications   Fluorescein Sodium 10 % injection 500 mg       Return in about 4 months (around 06/26/2021) for DILATE OU, COLOR FP, OCT.  There are no  Patient Instructions on file for this visit.   Explained the diagnoses, plan, and follow up with the patient and they expressed understanding.  Patient expressed understanding of the importance of proper follow up care.   Clent Demark Tonjua Rossetti M.D. Diseases & Surgery of the Retina and Vitreous Retina & Diabetic [place] 03/07/21     Abbreviations: M myopia (nearsighted); A astigmatism; H hyperopia (farsighted); P presbyopia; Mrx spectacle prescription;  CTL contact lenses; OD right eye; OS left eye; OU both eyes  XT exotropia; ET esotropia; PEK punctate epithelial keratitis; PEE punctate epithelial erosions; DES dry eye syndrome; MGD meibomian gland dysfunction; ATs artificial tears; PFAT's preservative free artificial tears; [place] nuclear sclerotic cataract; PSC posterior subcapsular cataract; ERM epi-retinal membrane; PVD posterior vitreous detachment; RD retinal detachment; DM diabetes mellitus; DR diabetic retinopathy; NPDR non-proliferative diabetic retinopathy;  PDR proliferative diabetic retinopathy; CSME clinically significant macular edema; DME diabetic macular edema; dbh dot blot hemorrhages; CWS cotton wool spot; POAG primary open angle glaucoma; C/D cup-to-disc ratio; HVF humphrey visual field; GVF goldmann visual field; OCT optical coherence tomography; IOP intraocular pressure; BRVO Branch retinal vein occlusion; CRVO central retinal vein occlusion; CRAO central retinal artery occlusion; BRAO branch retinal artery occlusion; RT retinal tear; SB scleral buckle; PPV pars plana vitrectomy; VH Vitreous hemorrhage; PRP panretinal laser photocoagulation; IVK intravitreal kenalog; VMT vitreomacular traction; MH Macular hole;  NVD neovascularization of the disc; NVE neovascularization elsewhere; AREDS age related eye disease study; ARMD age related macular degeneration; POAG primary open angle glaucoma; EBMD epithelial/anterior basement membrane dystrophy; ACIOL anterior chamber intraocular lens; IOL  intraocular lens; PCIOL posterior chamber intraocular lens; Phaco/IOL phacoemulsification with intraocular lens placement; [place] photorefractive keratectomy; LASIK laser assisted in situ keratomileusis; HTN hypertension; DM diabetes mellitus; COPD chronic obstructive pulmonary disease

## 2021-02-26 NOTE — Assessment & Plan Note (Signed)
Severe NPDR presents confirmed today by FFA, will continue to monitor

## 2021-02-28 ENCOUNTER — Telehealth: Payer: Self-pay | Admitting: *Deleted

## 2021-02-28 DIAGNOSIS — R079 Chest pain, unspecified: Secondary | ICD-10-CM

## 2021-02-28 DIAGNOSIS — I340 Nonrheumatic mitral (valve) insufficiency: Secondary | ICD-10-CM

## 2021-02-28 DIAGNOSIS — I5022 Chronic systolic (congestive) heart failure: Secondary | ICD-10-CM

## 2021-02-28 NOTE — Telephone Encounter (Signed)
Patient's daughter Sandra [name] informed and verbalized understanding of plan. Copy sent to PCP

## 2021-02-28 NOTE — Telephone Encounter (Signed)
-----   Message from Merlene Laughter, RN sent at 02/23/2021  7:16 AM EDT -----  ----- Message ----- From: Verta Ellen., NP Sent: 02/22/2021   7:25 PM EDT To: Laurine Blazer, LPN  Please call the patient and let her know the  stress test was equivocal per the physician who read the study. Dr Domenic Polite is recommending an echocardiogram due to the decreased pumping function on the stress test to see if the echocardiogram agrees with the calculated pumping function results on the stress test. Usually the echocardiogram is a more accurate measure ment of the pumping function of the heart.   Verta Ellen, NP  02/22/2021 7:17 PM

## 2021-03-03 DIAGNOSIS — E119 Type 2 diabetes mellitus without complications: Secondary | ICD-10-CM | POA: Diagnosis not present

## 2021-03-06 ENCOUNTER — Telehealth: Payer: Self-pay | Admitting: Family Medicine

## 2021-03-06 NOTE — Telephone Encounter (Signed)
Checking percert on the following patient for testing scheduled at [facility].      ECHO   03/13/2021

## 2021-03-07 MED ORDER — FLUORESCEIN SODIUM 10 % IV SOLN
500.0000 mg | INTRAVENOUS | Status: AC | PRN
  Administered 2021-03-07: 500 mg via INTRAVENOUS

## 2021-03-13 ENCOUNTER — Ambulatory Visit (HOSPITAL_COMMUNITY): Payer: Medicare Other

## 2021-03-13 ENCOUNTER — Ambulatory Visit (INDEPENDENT_AMBULATORY_CARE_PROVIDER_SITE_OTHER): Payer: Medicare Other

## 2021-03-13 DIAGNOSIS — I340 Nonrheumatic mitral (valve) insufficiency: Secondary | ICD-10-CM

## 2021-03-13 DIAGNOSIS — R079 Chest pain, unspecified: Secondary | ICD-10-CM | POA: Diagnosis not present

## 2021-03-13 DIAGNOSIS — I5022 Chronic systolic (congestive) heart failure: Secondary | ICD-10-CM

## 2021-03-13 LAB — ECHOCARDIOGRAM COMPLETE
Area-P 1/2: 3.5 cm2
Calc EF: 54.7 %
MV M vel: 4.53 m/s
MV Peak grad: 82.1 mmHg
MV Vena cont: 0.57 cm
S' Lateral: 3.83 cm
Single Plane A2C EF: 68.9 %
Single Plane A4C EF: 35.3 %

## 2021-03-13 NOTE — Progress Notes (Signed)
Cardiology Office Note  Date: 03/14/2021   ID: Sandra [name], DOB 07/19/1940, MRN [id number]  PCP:  Glenda Chroman, MD  Cardiologist:  None Electrophysiologist:  None   Chief Complaint: 6-week follow-up  History of Present Illness: Sandra [name] is a 80 y.o. female with a history of chronic systolic heart failure, atrial fibrillation, DVT, PE, DM2, SOB, lower extremity edema, palpitations, iron deficiency anemia . She last saw Dr. Harl Bowie on 11/30/2020.  Had some recent lower extremity edema left greater than right with some recent SOB/DOE.  There was progression.  Previous echo in April 2022 with EF of 45%, G1 DD, mild to moderate MR, mild to moderate TR.  Swelling was unchanged per her report but family reported was much improved.  Weight was up from 164 to 170 pounds.  Her shortness of breath was unchanged.  She was continuing Xarelto for anticoagulation due to history of PE.  She was being followed by GI for iron deficiency anemia.  Previous colonoscopy and EGD with polyps.  Plan was to increase Toprol to 100 mg daily, start losartan 25 mg daily.  Continue Lasix.  Weights had trended up since prior visit.  Dr. Harl Bowie mentioned may need to titrate diuretic pending upcoming labs.  Titrate Toprol and losartan as tolerated.  Repeat echo in the next few months if ongoing dysfunction and tolerating ARB would convert to Entresto at that time.  Long history of palpitations with benign ectopy on prior monitors.  Increasing Toprol to 100 mg daily.  Norvasc was being stopped in setting of a systolic dysfunction and starting losartan 25 mg daily.  She was last here 6-week follow-up.  She stated she had been experiencing some terrible indigestion daily prior 2+ weeks.  Stated a week prior she had indigestion so bad she was nauseated.  He stated for the prior 2 weeks indigestion/chest pain comes and goes with associated nausea and diaphoresis.  She denies any radiation to neck, arm, back, or jaw.   Stated it could occur at rest and with some activity although she states she is not very active on a daily basis.  She stays at a local assisted living facility.  She stated during that visit it had not been a particularly good day.  She felt tired and had some chest pain/heaviness.  She stated she had a stress test in the past but it has been a long time ago.  She was compliant with her medications but sometimes she did not receive her medications on a timely basis at the assisted living facility where she resides.  She recently had both upper and lower endoscopies with polyps.  Polyps were removed.  She had a 2 cm hiatal hernia.  She denied any complications after endoscopies.  She was on iron supplementation for anemia.  She denied any blood in her stool but stools were dark due to taking iron supplement.  Blood pressure was elevated at 156/70.  She she had not felt well the entire day with some continuing "indigestion".  EKG was obtained  Recent ED visit to [facility] ED on 02/24/2021 secondary to several week history of shortness of breath.  She awoke that morning and had feeling somewhat short of breath with shortness of breath with exertion.  She had no associated chest pains.  Troponins x2 were negative.  Chest CT showed no evidence of pulmonary embolus or pneumonia.  However CT did show aortic atherosclerosis as well as coronary artery calcifications.  Initial troponin  14, delta 7, subsequent troponin 13, creatinine 1.29, GFR 43, proBNP 844.  She is here today for follow-up on her recent stress test and echocardiogram. Stress test findings were equivocal.  Study was considered intermediate risk based on calculated EF of 43%.  She also had 0.5 mm of horizontal ST depression in 2, 3, aVF and V6.  She had occasional PACs, rare PVC.  EKG was borderline for ischemia.  LV perfusion was equivocal.  She had small, mild intensity anteroapical and basal inferolateral defect partially reversible in setting of  soft tissue attenuation.  Suggesting either variable breast attenuation or minor ischemic territories.  LV was abnormal at 43%.  She had a subsequent echocardiogram on 03/13/2021 with evidence of EF of 45 to 50% LV with global hypokinesis, mild LVH, G1 DD.  Mildly elevated PASP at 38 mmHg.  Mild to moderate MR.  She states her indigestion is somewhat better but she still has symptoms of indigestion.  We discussed the results of stress test, echocardiogram, and recent CT from [facility].  We discussed multiple risk factors for CAD.  Including diabetes, hypertension, age.  She continues with some shortness of breath and some indigestion-like symptoms.  She states the pain/indigestion-like symptoms are better but it continues.  We discussed increasing Imdur to 60 mg to see if this will help with symptoms.  We discussed possibility of cardiac catheterization as next step if symptoms persist.  We discussed risk and benefits of cardiac catheterization in the event she would need to undergo cath.  I mentioned I would forward the note to Dr. Harl Bowie for his review and evaluation.  Her current cardiac regimen includes; aspirin 81 mg daily, Lasix 40 mg p.o. daily, Toprol-XL 100 mg daily, losartan 25 mg p.o. daily, atorvastatin 80 mg daily, Xarelto 20 mg p.o. daily.   Past Medical History:  Diagnosis Date   Anxiety    Arthritis    "all over; especially in her hands" (01/26/2018)   Atrial fibrillation ([place])    Cancer ([place])    Depression    DVT (deep venous thrombosis), left 04/2011; 2017   after an ankle fracture.: "don't know what caused the one in 2017"   Hypertension    Migraine    "more often here lately" (01/26/2018)   Mini stroke 2018   "a series of mini strokes" (01/26/2018)   Mitral regurgitation 07/2011   moderate   Pulmonary embolism ([place]) 04/2011   Type II diabetes mellitus ([place])     Past Surgical History:  Procedure Laterality Date   ABDOMINAL HYSTERECTOMY     BIOPSY  11/15/2020   Procedure:  BIOPSY;  Surgeon: Harvel Quale, MD;  [facility]: [facility];  Service: Gastroenterology;;   [place] Anes     CATARACT EXTRACTION Bilateral    COLONOSCOPY  05/07/2012   Procedure: COLONOSCOPY;  Surgeon: Rogene Houston, MD;  Location: [facility];  Service: Endoscopy;  Laterality: N/A;  245   COLONOSCOPY WITH PROPOFOL N/A 11/15/2020   Procedure: COLONOSCOPY WITH PROPOFOL;  Surgeon: Harvel Quale, MD;  Location: [facility];  Service: Gastroenterology;  Laterality: N/A;  8:30, facility pt, will need rapid covid test   ESOPHAGOGASTRODUODENOSCOPY (EGD) WITH PROPOFOL N/A 11/15/2020   Procedure: ESOPHAGOGASTRODUODENOSCOPY (EGD) WITH PROPOFOL;  Surgeon: Harvel Quale, MD;  [facility];  Service: Gastroenterology;  Laterality: N/A;   GIVENS CAPSULE STUDY N/A 12/04/2020   Procedure: GIVENS CAPSULE STUDY;  Surgeon: Harvel Quale, MD;  [facility]: [facility];  Service: Gastroenterology;  Laterality: N/A;  7:30   HEMOSTASIS CLIP PLACEMENT  11/15/2020   Procedure: HEMOSTASIS CLIP PLACEMENT;  Surgeon: Harvel Quale, MD;  [facility];  Service: Gastroenterology;;   HERNIA REPAIR     SUBMUCOSAL TATTOO INJECTION  11/15/2020   Procedure: SUBMUCOSAL TATTOO INJECTION;  Surgeon: Harvel Quale, MD;  [facility]: [facility];  Service: Gastroenterology;;   TONSILLECTOMY      Current Outpatient Medications  Medication Sig Dispense Refill   acetaminophen (TYLENOL) 650 MG CR tablet Take 650 mg by mouth every 4 (four) hours as needed for pain.     aspirin 81 MG chewable tablet Chew 81 mg by mouth daily.     atorvastatin (LIPITOR) 80 MG tablet Take 80 mg by mouth daily.     calcium carbonate (TUMS - DOSED IN MG ELEMENTAL CALCIUM) 500 MG chewable tablet Chew 1 tablet by mouth every 8 (eight) hours as needed for indigestion.     cholestyramine (QUESTRAN) 4 g packet Take 4 g by mouth daily.     diclofenac Sodium (VOLTAREN) 1 %  GEL Apply 1 g topically 2 (two) times daily.     diphenoxylate-atropine (LOMOTIL) 2.5-0.025 MG tablet Take 1 tablet by mouth daily as needed for diarrhea or loose stools.     famotidine (PEPCID) 20 MG tablet Take 20 mg by mouth daily.     ferrous sulfate 325 (65 FE) MG tablet Take 325 mg by mouth daily with breakfast.     furosemide (LASIX) 40 MG tablet TAKE 1 TABLET BY MOUTH EVERY DAY 90 tablet 2   Glucose (TRUEPLUS GLUCOSE) 15 GM/32ML GEL Take 15 g by mouth daily as needed (blood sugar less than 60.).     HUMALOG KWIKPEN 100 UNIT/ML KwikPen Inject 15-18 Units into the skin See admin instructions. Inject 18 units subcutaneously in the morning & Inject 15 units subcutaneously in the afternoon & inject 8 units subcutaneously in the evening. Hold if sugar is <115     isosorbide mononitrate (IMDUR) 30 MG 24 hr tablet TAKE 1 TABLET BY MOUTH DAILY 30 tablet 1   LANTUS SOLOSTAR 100 UNIT/ML Solostar Pen Inject 30 Units into the skin in the morning and at bedtime.     losartan (COZAAR) 25 MG tablet Take 1 tablet (25 mg total) by mouth daily. 30 tablet 6   Melatonin 3 MG TBDP Take 3 mg by mouth at bedtime.     metFORMIN (GLUCOPHAGE) 1000 MG tablet Take 1,000 mg by mouth daily.     metoprolol succinate (TOPROL XL) 100 MG 24 hr tablet Take 1 tablet (100 mg total) by mouth daily. 90 tablet 3   nystatin (MYCOSTATIN) 100000 UNIT/ML suspension Use as directed 10 mLs in the mouth or throat daily as needed (DRY MOUTH).     omeprazole (PRILOSEC) 40 MG capsule Take 40 mg by mouth daily.     ondansetron (ZOFRAN) 4 MG tablet Take 4 mg by mouth every 8 (eight) hours as needed for nausea or vomiting.     oxybutynin (DITROPAN-XL) 5 MG 24 hr tablet Take 5 mg by mouth daily.     oxyCODONE-acetaminophen (PERCOCET/ROXICET) 5-325 MG tablet Take 1 tablet by mouth every 12 (twelve) hours as needed for severe pain.     Probiotic Product (ALIGN) 4 MG CAPS Take 4 mg by mouth daily.     psyllium (METAMUCIL) 58.6 % packet Take 1  packet by mouth daily as needed (constipation).     rivaroxaban (XARELTO) 20 MG TABS tablet Take 20  mg by mouth at bedtime.     Semaglutide,0.25 or 0.5MG /DOS, (OZEMPIC, 0.25 OR 0.5 MG/DOSE,) 2 MG/1.5ML SOPN Inject 0.5 mg into the skin every Thursday.     sertraline (ZOLOFT) 50 MG tablet Take 50 mg by mouth at bedtime.     No current facility-administered medications for this visit.   Allergies:  Buprenex [buprenorphine], Namenda [memantine hcl], and Other   Social History: The patient  reports that she has never smoked. She has never used smokeless tobacco. She reports that she does not drink alcohol and does not use drugs.   Family History: The patient's family history includes Diabetes in her mother; Heart attack in her mother; Hypertension in her mother.   ROS:  Please see the history of present illness. Otherwise, complete review of systems is positive for none.  All other systems are reviewed and negative.   Physical Exam: VS:  BP 128/72   Pulse 61   Ht 5\' 3"  (1.6 m)   Wt 167 lb (75.8 kg)   SpO2 97%   BMI 29.58 kg/m , BMI Body mass index is 29.58 kg/m.  Wt Readings from Last 3 Encounters:  03/14/21 167 lb (75.8 kg)  02/06/21 168 lb 9.6 oz (76.5 kg)  12/04/20 164 lb (74.4 kg)    General: Patient appears comfortable at rest. Neck: Supple, no elevated JVP or carotid bruits, no thyromegaly. Lungs: Clear to auscultation, nonlabored breathing at rest. Cardiac: Regular rate and rhythm, no S3 or significant systolic murmur, no pericardial rub. Extremities: No pitting edema, distal pulses 2+. Skin: Warm and dry. Musculoskeletal: No kyphosis. Neuropsychiatric: Alert and oriented x3, affect grossly appropriate.  ECG:    Recent Labwork: 11/15/2020: Hemoglobin 11.2; Platelets 121  No results found for: CHOL, TRIG, HDL, CHOLHDL, VLDL, LDLCALC, LDLDIRECT  Other Studies Reviewed Today:  Echocardiogram 03/13/2021 1. Left ventricular ejection fraction, by estimation, is 45 to 50%.  The  left ventricle has mildly decreased function. The left ventricle  demonstrates global hypokinesis. There is mild left ventricular  hypertrophy. Left ventricular diastolic parameters  are consistent with Grade I diastolic dysfunction (impaired relaxation).   2. Right ventricular systolic function is normal. The right ventricular  size is normal. There is mildly elevated pulmonary artery systolic  pressure. The estimated right ventricular systolic pressure is 01.7 mmHg.   3. The mitral valve is abnormal. Mild to moderate mitral valve  regurgitation.   4. The aortic valve is tricuspid. Aortic valve regurgitation is not  visualized.   5. The inferior vena cava is normal in size with greater than 50%  respiratory variability, suggesting right atrial pressure of 3 mmHg.   Comparison(s): Prior images     Lexiscan stress test 02/22/2021 Findings are equivocal. The study is intermediate risk based on calculated LVEF.   0.5 mm of horizontal ST depression (II, III, aVF and V6) was noted. Arrhythmias during stress: occasional PACs, rare PVCs. The ECG was borderline for ischemia.   LV perfusion is equivocal.  Small, mild intensity, anteroapical and basal inferolateral defects are partially reversible in the setting of soft tissue attenuation suggesting either variable breast attenuation or minor ischemic territories.   Left ventricular function is abnormal. Global function is moderately reduced. Nuclear stress EF: 43 %.   Intermediate risk study based on calculated LVEF of 43%.  Given frequent ectopy during the study and possibility that gating calculation of LVEF is inaccurate,, would suggest echocardiography.  Equivocal perfusion defects in the setting of variable soft tissue attenuation, less likely minor ischemic  territories.    Echo from pcp dated 08/07/2020 LVEF 45%, abnormal diastolic function based on E/A ratio would be grade I. Mild to mod MR, , mild to mod TR.      11/2020 echo A  complete portable two-dimensional transthoracic echocardiogram with color  flow Doppler and Spectral Doppler was performed. Saline contrast injection  was performed. The left ventricle is grossly normal size.  Injection of contrast documented no interatrial shunt .  There is borderline left ventricular hypertrophy.  The left ventricular ejection fraction is normal (60-65%).  The left ventricular wall motion is normal.  Right ventricular systolic pressure is normal.  The aortic valve is trileaflet.  Grade I mild diastolic dysfunction; abnormal relaxation pattern.  There is mild-moderate (1-2+) mitral regurgitation.    Assessment and Plan:  1. Chest pain, unspecified type   2. Chronic systolic heart failure (HCC)   3. Palpitations   4. Essential hypertension     1. Chest pain, unspecified type At prior visit she was complaining of indigestion/chest pain/chest heaviness over the prior 2 weeks.  Stated she had indigestion so bad she became nauseated and vomited.  She continued to have chest discomfort.  She denied any radiation but complained of nausea and diaphoresis at times.  She was not able to tell if exertion made it worse.  She was not very active on a daily basis.  Imdur was started at 30 mg daily at last visit.  Stress test was ordered which was abnormal as noted above.  We are increasing Imdur to 60 mg daily.  She continues to have some indigestion/chest pain like symptoms.  Continue aspirin 81 mg daily.  Continue Toprol-XL 100 mg daily.  Continue atorvastatin 80 mg daily.  I discussed if she continued to have chest pain in spite of increasing anginal therapy she would likely need to have a cardiac catheterization.  I advised her and her daughter of the process of cardiac catheterization.  We discussed the risk and benefits thoroughly.  I advised I would forward this information to Dr. Harl Bowie so he could make a decision as to whether she may or may not need a cardiac catheterization.   Patient and daughter verbalized understanding   2. Chronic systolic heart failure (HCC) Recent echocardiogram on 03/13/2021 with evidence of EF of 45 to 50% LV with global hypokinesis, mild LVH, G1 DD.  Mildly elevated PASP at 38 mmHg.  Mild to moderate MR. Continue Toprol-XL 100 mg daily, losartan 25 mg daily.  Lasix 40 mg daily.  Recent  [facility] visit to [facility] on 02/24/2021 with shortness of breath for several weeks.  Her proBNP was 844.  3. Palpitations Currently denies any palpitations.  She she did have some frequent PVCs on her EKG at last visit demonstrating y sinus rhythm with marked sinus arrhythmia with frequent premature ventricular complexes rate of 91, left axis deviation, LVH with repolarization abnormality.  4. Essential hypertension Blood pressure 128/72.  Continue Toprol-XL 100 mg daily.  Continue losartan 25 mg daily   Medication Adjustments/Labs and Tests Ordered: Current medicines are reviewed at length with the patient today.  Concerns regarding medicines are outlined above.   Disposition: Follow-up with Dr. Harl Bowie or APP 3 months   Signed, Levell July, NP 03/14/2021 4:07 PM    [facility] at [place], [place], [place] [address] Phone: [phone]; Fax: [phone]

## 2021-03-14 ENCOUNTER — Other Ambulatory Visit: Payer: Self-pay | Admitting: Family Medicine

## 2021-03-14 ENCOUNTER — Ambulatory Visit (INDEPENDENT_AMBULATORY_CARE_PROVIDER_SITE_OTHER): Payer: Medicare Other | Admitting: Family Medicine

## 2021-03-14 ENCOUNTER — Encounter: Payer: Self-pay | Admitting: Family Medicine

## 2021-03-14 VITALS — BP 128/72 | HR 61 | Ht 63.0 in | Wt 167.0 lb

## 2021-03-14 DIAGNOSIS — I10 Essential (primary) hypertension: Secondary | ICD-10-CM | POA: Diagnosis not present

## 2021-03-14 DIAGNOSIS — I5022 Chronic systolic (congestive) heart failure: Secondary | ICD-10-CM

## 2021-03-14 DIAGNOSIS — R079 Chest pain, unspecified: Secondary | ICD-10-CM

## 2021-03-14 DIAGNOSIS — R002 Palpitations: Secondary | ICD-10-CM | POA: Diagnosis not present

## 2021-03-14 MED ORDER — ISOSORBIDE MONONITRATE ER 60 MG PO TB24: 60.0000 mg | ORAL_TABLET | Freq: Every day | ORAL | 6 refills | Status: DC

## 2021-03-14 NOTE — Patient Instructions (Addendum)
Medication Instructions:  Increase Imdur to 60mg  daily  Continue all other medications.     Labwork: none  Testing/Procedures: none  Follow-Up: 3 months   Any Other Special Instructions Will Be Listed Below (If Applicable).   If you need a refill on your cardiac medications before your next appointment, please call your pharmacy.

## 2021-03-19 DIAGNOSIS — I10 Essential (primary) hypertension: Secondary | ICD-10-CM | POA: Diagnosis not present

## 2021-03-19 DIAGNOSIS — Z299 Encounter for prophylactic measures, unspecified: Secondary | ICD-10-CM | POA: Diagnosis not present

## 2021-03-19 DIAGNOSIS — E1165 Type 2 diabetes mellitus with hyperglycemia: Secondary | ICD-10-CM | POA: Diagnosis not present

## 2021-03-19 DIAGNOSIS — I509 Heart failure, unspecified: Secondary | ICD-10-CM | POA: Diagnosis not present

## 2021-03-20 NOTE — Progress Notes (Signed)
Spoke with daughter Mickel Baas) - states she may be some better, no worsening though.  Does still c/o mild heart burn & gets out of breath easily.

## 2021-03-20 NOTE — Progress Notes (Signed)
Looked over last clinic note with Jonni Sanger, stress test fairly mild findings. Would see how she does with additional medication changes before considering cath. Can they update [place] on her symptoms in 1 week   Zandra Abts MD

## 2021-03-23 DIAGNOSIS — Z299 Encounter for prophylactic measures, unspecified: Secondary | ICD-10-CM | POA: Diagnosis not present

## 2021-03-23 DIAGNOSIS — E1165 Type 2 diabetes mellitus with hyperglycemia: Secondary | ICD-10-CM | POA: Diagnosis not present

## 2021-03-23 DIAGNOSIS — J069 Acute upper respiratory infection, unspecified: Secondary | ICD-10-CM | POA: Diagnosis not present

## 2021-03-23 DIAGNOSIS — I10 Essential (primary) hypertension: Secondary | ICD-10-CM | POA: Diagnosis not present

## 2021-03-23 DIAGNOSIS — I509 Heart failure, unspecified: Secondary | ICD-10-CM | POA: Diagnosis not present

## 2021-03-27 DIAGNOSIS — Z299 Encounter for prophylactic measures, unspecified: Secondary | ICD-10-CM | POA: Diagnosis not present

## 2021-03-27 DIAGNOSIS — U071 COVID-19: Secondary | ICD-10-CM | POA: Diagnosis not present

## 2021-03-27 DIAGNOSIS — I10 Essential (primary) hypertension: Secondary | ICD-10-CM | POA: Diagnosis not present

## 2021-04-02 DIAGNOSIS — E119 Type 2 diabetes mellitus without complications: Secondary | ICD-10-CM | POA: Diagnosis not present

## 2021-04-09 ENCOUNTER — Ambulatory Visit: Payer: Medicare Other | Admitting: Family Medicine

## 2021-04-09 DIAGNOSIS — B370 Candidal stomatitis: Secondary | ICD-10-CM | POA: Diagnosis not present

## 2021-04-09 DIAGNOSIS — D6869 Other thrombophilia: Secondary | ICD-10-CM | POA: Diagnosis not present

## 2021-04-09 DIAGNOSIS — Z299 Encounter for prophylactic measures, unspecified: Secondary | ICD-10-CM | POA: Diagnosis not present

## 2021-04-09 DIAGNOSIS — J069 Acute upper respiratory infection, unspecified: Secondary | ICD-10-CM | POA: Diagnosis not present

## 2021-04-09 DIAGNOSIS — I10 Essential (primary) hypertension: Secondary | ICD-10-CM | POA: Diagnosis not present

## 2021-04-17 ENCOUNTER — Encounter (INDEPENDENT_AMBULATORY_CARE_PROVIDER_SITE_OTHER): Payer: Self-pay | Admitting: *Deleted

## 2021-04-22 DIAGNOSIS — E119 Type 2 diabetes mellitus without complications: Secondary | ICD-10-CM | POA: Diagnosis not present

## 2021-06-07 DIAGNOSIS — I10 Essential (primary) hypertension: Secondary | ICD-10-CM | POA: Diagnosis not present

## 2021-06-07 DIAGNOSIS — E1143 Type 2 diabetes mellitus with diabetic autonomic (poly)neuropathy: Secondary | ICD-10-CM | POA: Diagnosis not present

## 2021-06-07 DIAGNOSIS — K3184 Gastroparesis: Secondary | ICD-10-CM | POA: Diagnosis not present

## 2021-06-07 DIAGNOSIS — Z299 Encounter for prophylactic measures, unspecified: Secondary | ICD-10-CM | POA: Diagnosis not present

## 2021-06-07 DIAGNOSIS — K219 Gastro-esophageal reflux disease without esophagitis: Secondary | ICD-10-CM | POA: Diagnosis not present

## 2021-06-13 ENCOUNTER — Encounter (INDEPENDENT_AMBULATORY_CARE_PROVIDER_SITE_OTHER): Payer: Self-pay | Admitting: *Deleted

## 2021-06-13 ENCOUNTER — Other Ambulatory Visit: Payer: Self-pay | Admitting: Cardiology

## 2021-06-19 ENCOUNTER — Encounter (INDEPENDENT_AMBULATORY_CARE_PROVIDER_SITE_OTHER): Payer: Self-pay | Admitting: Internal Medicine

## 2021-06-19 ENCOUNTER — Ambulatory Visit (INDEPENDENT_AMBULATORY_CARE_PROVIDER_SITE_OTHER): Payer: Medicare Other | Admitting: Internal Medicine

## 2021-06-19 ENCOUNTER — Other Ambulatory Visit: Payer: Self-pay

## 2021-06-19 VITALS — BP 146/72 | Temp 98.4°F | Ht 63.0 in | Wt 149.3 lb

## 2021-06-19 DIAGNOSIS — K219 Gastro-esophageal reflux disease without esophagitis: Secondary | ICD-10-CM | POA: Diagnosis not present

## 2021-06-19 DIAGNOSIS — R1319 Other dysphagia: Secondary | ICD-10-CM

## 2021-06-19 DIAGNOSIS — R197 Diarrhea, unspecified: Secondary | ICD-10-CM | POA: Insufficient documentation

## 2021-06-19 DIAGNOSIS — D509 Iron deficiency anemia, unspecified: Secondary | ICD-10-CM

## 2021-06-19 MED ORDER — PANTOPRAZOLE SODIUM 40 MG PO TBEC: 40.0000 mg | DELAYED_RELEASE_TABLET | Freq: Every day | ORAL | 5 refills | Status: DC

## 2021-06-19 MED ORDER — FAMOTIDINE 20 MG PO TABS: 20.0000 mg | ORAL_TABLET | Freq: Every day | ORAL | Status: DC

## 2021-06-19 NOTE — Patient Instructions (Signed)
Please check with Dr. Woody Seller if metformin could be stopped as it may be making her diarrhea worse. Take pantoprazole 40 mg by mouth 30 minutes before breakfast daily and take famotidine OTC 20 mg daily at bedtime. Take Metamucil by mouth every night. Physician will call with results of blood work when completed.

## 2021-06-19 NOTE — Progress Notes (Signed)
Presenting complaint;  History of iron deficiency anemia. History of GERD. Patient complains of intermittent diarrhea and bloating.  Database and subjective:  Patient is 81 year old Caucasian female with multiple medical problems who is here for for scheduled visit accompanied by her son Ronalee Belts.  Patient lives with her daughter Mickel Baas who could not come today. Patient was last seen by Dr. Jenetta Downer in July 2022 for iron deficiency anemia.  She underwent esophagogastroduodenoscopy and colonoscopy.  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  Colonoscopy revealed 20 mm polyp at ascending colon which was removed piecemeal.  Smaller polyp from transverse colon was also removed.  Both of these polyps are tubular adenomas.  Dr. Jenetta Downer recommended follow-up exam in 6 months.  Celiac antibody panel was recommended but she never went to the lab.  She also had small bowel given capsule study in August 2022 which revealed 2 small distal small bowel erosions without stigmata of bleed.  Patient was advised to continue p.o. iron.  Patient says she is now living with her daughter.  She had been in assisted living for about 4 years.  She had had multiple syncopal episodes and gait issues.  She has had history of mini strokes.  Since she has been using walker she has not had any falling episodes.  She has not fallen this year.  She complains of intermittent nausea.  She remains with diarrhea she has 2-3 loose stools daily.  She also has explosive bowel movements flatulence.  She has been using pantoprazole on as-needed basis.  She feels she needs to be taking it daily since she does so much better.  She is using Tums and/or Rolaids on as-needed basis.  She was having nausea and vomiting and  regurgitation and as if she was choking.  She was given metoclopramide by Dr. Woody Seller for 7 days.  She states this medication alleviated sensation of choking and regurgitation.  The symptoms are back since she is not taking metoclopramide every day.  She points to suprasternal area as site of bolus obstruction.  She has not had any episode of food impaction. She denies hematemesis melena or rectal bleeding. Patient would like to know if her hemoglobin is normal.  She also wants her iron levels checked to make sure she is not deficient.   Current Medications: Outpatient Encounter Medications as of 06/19/2021  Medication Sig   acetaminophen (TYLENOL) 650 MG CR tablet Take 650 mg by mouth every 4 (four) hours as needed for pain.   aspirin 81 MG chewable tablet Chew 81 mg by mouth daily.   atorvastatin (LIPITOR) 80 MG tablet Take 80 mg by mouth daily.   calcium carbonate (TUMS - DOSED IN MG ELEMENTAL CALCIUM) 500 MG chewable tablet Chew 1 tablet by mouth every 8 (eight) hours as needed for indigestion.   cholestyramine (QUESTRAN) 4 g packet Take 4 g by mouth daily.   diclofenac Sodium (VOLTAREN) 1 % GEL Apply 1 g topically 2 (two) times daily.   diphenoxylate-atropine (LOMOTIL) 2.5-0.025 MG tablet Take 1 tablet by mouth daily as needed for diarrhea or loose stools.   famotidine (PEPCID) 20 MG tablet Take 20 mg by mouth daily.   ferrous sulfate 325 (65 FE) MG tablet Take 325 mg by mouth daily with breakfast.   furosemide (LASIX) 40 MG tablet TAKE  1 TABLET BY MOUTH EVERY DAY   Glucose (TRUEPLUS GLUCOSE) 15 GM/32ML GEL Take 15 g by mouth daily as needed (blood sugar less than 60.).   HUMALOG KWIKPEN 100 UNIT/ML KwikPen Inject 15-18 Units into the skin See admin instructions. Inject 18 units subcutaneously in the morning & Inject 15 units subcutaneously in the afternoon & inject 8 units subcutaneously in the evening. Hold if sugar is <115   isosorbide mononitrate (IMDUR) 60 MG 24 hr tablet Take 1 tablet (60  mg total) by mouth daily.   LANTUS SOLOSTAR 100 UNIT/ML Solostar Pen Inject 30 Units into the skin in the morning and at bedtime.   losartan (COZAAR) 25 MG tablet TAKE 1 TABLET BY MOUTH DAILY   Melatonin 3 MG TBDP Take 3 mg by mouth at bedtime.   metFORMIN (GLUCOPHAGE) 1000 MG tablet Take 1,000 mg by mouth daily.   metoprolol succinate (TOPROL XL) 100 MG 24 hr tablet Take 1 tablet (100 mg total) by mouth daily.   nystatin (MYCOSTATIN) 100000 UNIT/ML suspension Use as directed 10 mLs in the mouth or throat daily as needed (DRY MOUTH).   omeprazole (PRILOSEC) 40 MG capsule Take 40 mg by mouth daily.   ondansetron (ZOFRAN) 4 MG tablet Take 4 mg by mouth every 8 (eight) hours as needed for nausea or vomiting.   oxybutynin (DITROPAN-XL) 5 MG 24 hr tablet Take 5 mg by mouth daily.   oxyCODONE-acetaminophen (PERCOCET/ROXICET) 5-325 MG tablet Take 1 tablet by mouth every 12 (twelve) hours as needed for severe pain.   Probiotic Product (ALIGN) 4 MG CAPS Take 4 mg by mouth daily.   psyllium (METAMUCIL) 58.6 % packet Take 1 packet by mouth daily as needed (constipation).   rivaroxaban (XARELTO) 20 MG TABS tablet Take 20 mg by mouth at bedtime.   Semaglutide,0.25 or 0.5MG /DOS, (OZEMPIC, 0.25 OR 0.5 MG/DOSE,) 2 MG/1.5ML SOPN Inject 0.5 mg into the skin every Thursday.   sertraline (ZOLOFT) 50 MG tablet Take 50 mg by mouth at bedtime.   No facility-administered encounter medications on file as of 06/19/2021.   Past Medical History:  Diagnosis Date   Anxiety    Arthritis    "all over; especially in her hands" (01/26/2018)   Atrial fibrillation ([place])    Cancer ([place])    Depression    DVT (deep venous thrombosis), left 04/2011; 2017   after an ankle fracture.: "don't know what caused the one in 2017"   Hypertension    Migraine    "more often here lately" (01/26/2018)   Mini stroke 2018   "a series of mini strokes" (01/26/2018)   Mitral regurgitation 07/2011   moderate   Pulmonary embolism ([place]) 04/2011    Type II diabetes mellitus ([place])    Past Surgical History:  Procedure Laterality Date   ABDOMINAL HYSTERECTOMY     BIOPSY  11/15/2020   Procedure: BIOPSY;  Surgeon: Harvel Quale, MD;  [facility];  Service: Gastroenterology;;   Lillard Anes     CATARACT EXTRACTION Bilateral    COLONOSCOPY  05/07/2012   Procedure: COLONOSCOPY;  Surgeon: Rogene Houston, MD;  [facility];  Service: Endoscopy;  Laterality: N/A;  245   COLONOSCOPY WITH PROPOFOL N/A 11/15/2020   Procedure: COLONOSCOPY WITH PROPOFOL;  Surgeon: Harvel Quale, MD;  [facility];  Service: Gastroenterology;  Laterality: N/A;  8:30, facility pt, will need rapid covid test   ESOPHAGOGASTRODUODENOSCOPY (EGD) WITH PROPOFOL N/A 11/15/2020   Procedure: ESOPHAGOGASTRODUODENOSCOPY (EGD) WITH PROPOFOL;  Surgeon: Montez Morita, Quillian Quince, MD;  [facility];  Service: Gastroenterology;  Laterality: N/A;   GIVENS CAPSULE STUDY N/A 12/04/2020   Procedure: GIVENS CAPSULE STUDY;  Surgeon: Harvel Quale, MD;  [facility];  Service: Gastroenterology;  Laterality: N/A;  7:30   HEMOSTASIS CLIP PLACEMENT  11/15/2020   Procedure: HEMOSTASIS CLIP PLACEMENT;  Surgeon: Harvel Quale, MD;  [facility];  Service: Gastroenterology;;   HERNIA REPAIR     SUBMUCOSAL TATTOO INJECTION  11/15/2020   Procedure: SUBMUCOSAL TATTOO INJECTION;  Surgeon: Harvel Quale, MD;  [facility];  Service: Gastroenterology;;   TONSILLECTOMY       Objective: Blood pressure (!) 146/72, temperature 98.4 F (36.9 C), temperature source Oral, height 5\' 3"  (1.6 m), weight 149 lb 4.8 oz (67.7 kg). Patient is alert and in no acute distress. Conjunctiva is pink. Sclera is nonicteric Oropharyngeal mucosa is normal. No neck masses or thyromegaly noted. Cardiac exam with regular rhythm normal S1 and S2. No murmur or gallop noted. Lungs are clear to  auscultation. Abdomen is soft and nontender with organomegaly or masses. No LE edema or clubbing noted.  Labs/studies Results:   CBC Latest Ref Rng & Units 11/15/2020 01/26/2018 01/24/2018  WBC 4.0 - 10.5 K/uL 8.9 7.1 9.2  Hemoglobin 12.0 - 15.0 g/dL 11.2(L) 12.0 11.5(L)  Hematocrit 36.0 - 46.0 % 35.3(L) 38.7 36.6  Platelets 150 - 400 K/uL 121(L) 169 168    CMP Latest Ref Rng & Units 01/27/2018 01/26/2018 01/25/2018  Glucose 70 - 99 mg/dL 187(H) 96 121(H)  BUN 8 - 23 mg/dL 15 14 12   Creatinine 0.44 - 1.00 mg/dL 0.85 0.73 0.67  Sodium 135 - 145 mmol/L 138 138 134(L)  Potassium 3.5 - 5.1 mmol/L 4.5 4.2 4.3  Chloride 98 - 111 mmol/L 106 106 103  CO2 22 - 32 mmol/L 24 23 22   Calcium 8.9 - 10.3 mg/dL 8.4(L) 8.4(L) 8.4(L)  Total Protein 6.0 - 8.3 g/dL - - -  Total Bilirubin 0.3 - 1.2 mg/dL - - -  Alkaline Phos 39 - 117 U/L - - -  AST 0 - 37 U/L - - -  ALT 0 - 35 U/L - - -    Lab data from 02/24/2021 H&H 12.2 and 37.1 and MCV 89.   Assessment:  #1.  GERD.  Her symptoms are not well controlled with as needed PPI.  Therefore would be appropriate for her to take it every day.  EGD in July 2022 revealed small sliding hiatal hernia but no evidence of erosive esophagitis.  Patient would also benefit from taking famotidine OTC 20 mg at bedtime.  If this combination does not work may consider gastric emptying study to make sure she does not have gastroparesis given history of diabetes mellitus.  #2.  Diarrhea.  She had colonoscopy in July 2022 and there was no evidence of endoscopic colitis.  Diarrhea could be due to diabetes mellitus metformin or even IBS.  Duodenal biopsy revealed intraepithelial lymphocytes.  Therefore celiac disease remains in differential diagnosis.  She did not go to the lab for celiac antibody panel as recommended.  We will treat her with fiber supplement and asked Dr. Woody Seller to discontinue metformin if feasible.  #3.  Esophageal dysphagia.  Suspect motility disorder.  No  structural abnormality noted on EGD of July 2022.  She will be further evaluated with barium study.  #4.  History of iron deficiency anemia she had EGD and colonoscopy in  July 2022 and given capsule study a month later.  It remains to be seen if she has celiac disease.  H&H was normal about 11 weeks ago.  #5.  History of 20 mm tubular adenoma at ascending colon which was removed piecemeal.  Biopsy negative for high-grade dysplasia.  She will need to have size reexamined to make sure no residual polyp left.  Will plan colonoscopy with Dr. Jenetta Downer evaluation for dysphagia and diarrhea completed.   Plan:  Patient advised to check with Dr. Woody Seller if metformin could be discontinued. Take pantoprazole 40 mg by mouth 30 minutes before breakfast daily and famotidine OTC 20 mg by mouth at bedtime. Metamucil 1 heaping tablespoonful or 4 g by mouth daily at bedtime. Patient will go to the lab for CBC, serum iron TIBC ferritin and celiac antibody panel. Follow-up visit date to be determined.

## 2021-06-25 ENCOUNTER — Encounter (INDEPENDENT_AMBULATORY_CARE_PROVIDER_SITE_OTHER): Payer: Medicare Other | Admitting: Ophthalmology

## 2021-06-26 ENCOUNTER — Encounter (INDEPENDENT_AMBULATORY_CARE_PROVIDER_SITE_OTHER): Payer: Medicare Other | Admitting: Ophthalmology

## 2021-06-26 ENCOUNTER — Other Ambulatory Visit: Payer: Self-pay

## 2021-06-26 ENCOUNTER — Ambulatory Visit (HOSPITAL_COMMUNITY)
Admission: RE | Admit: 2021-06-26 | Discharge: 2021-06-26 | Disposition: A | Discharge status: Home / Self Care | Payer: Medicare Other | Source: Ambulatory Visit | Attending: Internal Medicine | Admitting: Internal Medicine

## 2021-06-26 DIAGNOSIS — K224 Dyskinesia of esophagus: Secondary | ICD-10-CM | POA: Diagnosis not present

## 2021-06-26 DIAGNOSIS — R1310 Dysphagia, unspecified: Secondary | ICD-10-CM | POA: Diagnosis not present

## 2021-06-26 DIAGNOSIS — R1319 Other dysphagia: Secondary | ICD-10-CM | POA: Diagnosis not present

## 2021-06-28 DIAGNOSIS — E1165 Type 2 diabetes mellitus with hyperglycemia: Secondary | ICD-10-CM | POA: Diagnosis not present

## 2021-06-28 DIAGNOSIS — I509 Heart failure, unspecified: Secondary | ICD-10-CM | POA: Diagnosis not present

## 2021-06-28 DIAGNOSIS — Z299 Encounter for prophylactic measures, unspecified: Secondary | ICD-10-CM | POA: Diagnosis not present

## 2021-06-28 DIAGNOSIS — D509 Iron deficiency anemia, unspecified: Secondary | ICD-10-CM | POA: Diagnosis not present

## 2021-06-28 DIAGNOSIS — I10 Essential (primary) hypertension: Secondary | ICD-10-CM | POA: Diagnosis not present

## 2021-06-28 DIAGNOSIS — Z6824 Body mass index (BMI) 24.0-24.9, adult: Secondary | ICD-10-CM | POA: Diagnosis not present

## 2021-07-02 ENCOUNTER — Other Ambulatory Visit: Payer: Self-pay

## 2021-07-02 ENCOUNTER — Ambulatory Visit (INDEPENDENT_AMBULATORY_CARE_PROVIDER_SITE_OTHER): Payer: Medicare Other | Admitting: Cardiology

## 2021-07-02 ENCOUNTER — Encounter: Payer: Self-pay | Admitting: Cardiology

## 2021-07-02 VITALS — BP 130/64 | HR 90 | Ht 63.5 in | Wt 144.0 lb

## 2021-07-02 DIAGNOSIS — I5022 Chronic systolic (congestive) heart failure: Secondary | ICD-10-CM

## 2021-07-02 DIAGNOSIS — I10 Essential (primary) hypertension: Secondary | ICD-10-CM

## 2021-07-02 DIAGNOSIS — R0789 Other chest pain: Secondary | ICD-10-CM

## 2021-07-02 DIAGNOSIS — R002 Palpitations: Secondary | ICD-10-CM | POA: Diagnosis not present

## 2021-07-02 NOTE — Progress Notes (Signed)
Clinical Summary Sandra [name] is a 81 y.o.female seen today for follow up of the following medical problems     SOB/LE edema - recent LE edema, L>R.  - has had some recent SOB/DOE, that progressing -  07/2014 echo LVEF 60-65%, grade I dd,    07/2020 echo pcp:  LVEF 45%, grade I dd, mild to mod MR, mild to mod TR    - 02/2021 echo LVEF 45-50%, grade I dd.  - weight 144 lbs today. Had been 167 in 02/2021.  - improved LE edema - compliant with lasix '40mg'$  daily.      2. Palpitations - from notes has had PVCs and short runs of PSVT - reports some recent symptoms - compliant with toprol   - no recent symptoms.      3. Mitral regurgitation - mild to moderate by 2020 echo at [facility] 02/2021 mild to mod     4. History of PE - on anticoag, she is on xarelto     5. Iron deficient anemia - followed by GI - colonscopy hemorroids, 2 polyps - EGD few gastric polyps  6.Chest pain - 02/2021 nuclear stress: likely breast atteunation vs less likely mild ischemia, LVEF 43% by nuclear - 02/2021 echo LVEF 45-50%, grade I dd.   - taking antacid daily, symptoms have improved  7. Mitral regurgitation - 2022 echo mild to mod MR  8. GERD/Dysphagia -followed by GI EGD in July 2022 revealed small sliding hiatal hernia but no evidence of erosive esophagitis.       Past Medical History:  Diagnosis Date   Anxiety    Arthritis    "all over; especially in her hands" (01/26/2018)   Atrial fibrillation ([place])    Cancer ([place])    Depression    DVT (deep venous thrombosis), left 04/2011; 2017   after an ankle fracture.: "don't know what caused the one in 2017"   Hypertension    Migraine    "more often here lately" (01/26/2018)   Mini stroke 2018   "a series of mini strokes" (01/26/2018)   Mitral regurgitation 07/2011   moderate   Pulmonary embolism ([place]) 04/2011   Type II diabetes mellitus ([facility])      Allergies  Allergen Reactions   Buprenex [Buprenorphine] Nausea And Vomiting    Namenda [Memantine Hcl] Other (See Comments)    Sores in the mouth   Other Other (See Comments)    Shot for pain caused severe nausea. Had to spend the night in the hospital. Shot to numb mouth at the dentist = made her feel like she was smothering.      Current Outpatient Medications  Medication Sig Dispense Refill   acetaminophen (TYLENOL) 650 MG CR tablet Take 650 mg by mouth every 4 (four) hours as needed for pain.     aspirin 81 MG chewable tablet Chew 81 mg by mouth daily.     atorvastatin (LIPITOR) 80 MG tablet Take 80 mg by mouth daily.     calcium carbonate (TUMS - DOSED IN MG ELEMENTAL CALCIUM) 500 MG chewable tablet Chew 1 tablet by mouth every 8 (eight) hours as needed for indigestion.     cholestyramine (QUESTRAN) 4 g packet Take 4 g by mouth daily.     diclofenac Sodium (VOLTAREN) 1 % GEL Apply 1 g topically 2 (two) times daily.     diphenoxylate-atropine (LOMOTIL) 2.5-0.025 MG tablet Take 1 tablet by mouth daily as needed for diarrhea or loose stools.  famotidine (PEPCID) 20 MG tablet Take 1 tablet (20 mg total) by mouth at bedtime.     ferrous sulfate 325 (65 FE) MG tablet Take 325 mg by mouth daily with breakfast.     furosemide (LASIX) 40 MG tablet TAKE 1 TABLET BY MOUTH EVERY DAY 90 tablet 2   Glucose (TRUEPLUS GLUCOSE) 15 GM/32ML GEL Take 15 g by mouth daily as needed (blood sugar less than 60.).     HUMALOG KWIKPEN 100 UNIT/ML KwikPen Inject 15-18 Units into the skin See admin instructions. Inject 18 units subcutaneously in the morning & Inject 15 units subcutaneously in the afternoon & inject 8 units subcutaneously in the evening. Hold if sugar is <115     isosorbide mononitrate (IMDUR) 60 MG 24 hr tablet Take 1 tablet (60 mg total) by mouth daily. 30 tablet 6   LANTUS SOLOSTAR 100 UNIT/ML Solostar Pen Inject 30 Units into the skin in the morning and at bedtime.     losartan (COZAAR) 25 MG tablet TAKE 1 TABLET BY MOUTH DAILY 30 tablet 6   Melatonin 3 MG TBDP Take  3 mg by mouth at bedtime.     metFORMIN (GLUCOPHAGE) 1000 MG tablet Take 1,000 mg by mouth daily.     metoprolol succinate (TOPROL XL) 100 MG 24 hr tablet Take 1 tablet (100 mg total) by mouth daily. 90 tablet 3   nystatin (MYCOSTATIN) 100000 UNIT/ML suspension Use as directed 10 mLs in the mouth or throat daily as needed (DRY MOUTH).     ondansetron (ZOFRAN) 4 MG tablet Take 4 mg by mouth every 8 (eight) hours as needed for nausea or vomiting.     oxybutynin (DITROPAN-XL) 5 MG 24 hr tablet Take 5 mg by mouth daily.     oxyCODONE-acetaminophen (PERCOCET/ROXICET) 5-325 MG tablet Take 1 tablet by mouth every 12 (twelve) hours as needed for severe pain.     pantoprazole (PROTONIX) 40 MG tablet Take 1 tablet (40 mg total) by mouth daily before breakfast. 30 tablet 5   Probiotic Product (ALIGN) 4 MG CAPS Take 4 mg by mouth daily.     psyllium (METAMUCIL) 58.6 % packet Take 1 packet by mouth daily as needed (constipation).     rivaroxaban (XARELTO) 20 MG TABS tablet Take 20 mg by mouth at bedtime.     Semaglutide,0.25 or 0.'5MG'$ /DOS, (OZEMPIC, 0.25 OR 0.5 MG/DOSE,) 2 MG/1.5ML SOPN Inject 0.5 mg into the skin every Thursday.     sertraline (ZOLOFT) 50 MG tablet Take 50 mg by mouth at bedtime.     No current facility-administered medications for this visit.     Past Surgical History:  Procedure Laterality Date   ABDOMINAL HYSTERECTOMY     BIOPSY  11/15/2020   Procedure: BIOPSY;  Surgeon: Harvel Quale, MD;  [facility];  Service: Gastroenterology;;   Lillard Anes     CATARACT EXTRACTION Bilateral    COLONOSCOPY  05/07/2012   Procedure: COLONOSCOPY;  Surgeon: Rogene Houston, MD;  [facility];  Service: Endoscopy;  Laterality: N/A;  245   COLONOSCOPY WITH PROPOFOL N/A 11/15/2020   Procedure: COLONOSCOPY WITH PROPOFOL;  Surgeon: Harvel Quale, MD;  [facility];  Service: Gastroenterology;  Laterality: N/A;  8:30, facility pt, will need rapid  covid test   ESOPHAGOGASTRODUODENOSCOPY (EGD) WITH PROPOFOL N/A 11/15/2020   Procedure: ESOPHAGOGASTRODUODENOSCOPY (EGD) WITH PROPOFOL;  Surgeon: Harvel Quale, MD;  [facility];  Service: Gastroenterology;  Laterality: N/A;   GIVENS CAPSULE STUDY  N/A 12/04/2020   Procedure: GIVENS CAPSULE STUDY;  Surgeon: Harvel Quale, MD;  [facility];  Service: Gastroenterology;  Laterality: N/A;  7:30   HEMOSTASIS CLIP PLACEMENT  11/15/2020   Procedure: HEMOSTASIS CLIP PLACEMENT;  Surgeon: Harvel Quale, MD;  [facility];  Service: Gastroenterology;;   HERNIA REPAIR     SUBMUCOSAL TATTOO INJECTION  11/15/2020   Procedure: SUBMUCOSAL TATTOO INJECTION;  Surgeon: Harvel Quale, MD;  [facility];  Service: Gastroenterology;;   TONSILLECTOMY       Allergies  Allergen Reactions   Buprenex [Buprenorphine] Nausea And Vomiting   Namenda [Memantine Hcl] Other (See Comments)    Sores in the mouth   Other Other (See Comments)    Shot for pain caused severe nausea. Had to spend the night in the hospital. Shot to numb mouth at the dentist = made her feel like she was smothering.       Family History  Problem Relation Age of Onset   Hypertension Mother    Diabetes Mother    Heart attack Mother      Social History Sandra [name] reports that she has never smoked. She has never used smokeless tobacco. Sandra [name] reports no history of alcohol use.   Review of Systems CONSTITUTIONAL: No weight loss, fever, chills, weakness or fatigue.  HEENT: Eyes: No visual loss, blurred vision, double vision or yellow sclerae.No hearing loss, sneezing, congestion, runny nose or sore throat.  SKIN: No rash or itching.  CARDIOVASCULAR: per hpi RESPIRATORY: No shortness of breath, cough or sputum.  GASTROINTESTINAL: No anorexia, nausea, vomiting or diarrhea. No abdominal pain or blood.  GENITOURINARY: No burning on urination, no  polyuria NEUROLOGICAL: No headache, dizziness, syncope, paralysis, ataxia, numbness or tingling in the extremities. No change in bowel or bladder control.  MUSCULOSKELETAL: No muscle, back pain, joint pain or stiffness.  LYMPHATICS: No enlarged nodes. No history of splenectomy.  PSYCHIATRIC: No history of depression or anxiety.  ENDOCRINOLOGIC: No reports of sweating, cold or heat intolerance. No polyuria or polydipsia.  Marland Kitchen   Physical Examination Today's Vitals   07/02/21 1418  BP: 130/64  Pulse: 90  SpO2: 98%  Weight: 144 lb (65.3 kg)  Height: 5' 3.5" (1.613 m)   Body mass index is 25.11 kg/m.  Gen: resting comfortably, no acute distress HEENT: no scleral icterus, pupils equal round and reactive, no palptable cervical adenopathy,  CV: RRR, no m/r/g, no jvd Resp: Clear to auscultation bilaterally GI: abdomen is soft, non-tender, non-distended, normal bowel sounds, no hepatosplenomegaly MSK: extremities are warm, no edema.  Skin: warm, no rash Neuro:  no focal deficits Psych: appropriate affect   Diagnostic Studies  echo from pcp dated 08/07/2020 LVEF 45%, abnormal diastolicfunction based on E/A ratio would be grade I. Mild to mod MR, , mild to mod TR.      11/2020 echo A complete portable two-dimensional transthoracic echocardiogram with color  flow Doppler and Spectral Doppler was performed. Saline contrast injection  was performed. The left ventricle is grossly normal size.  Injection of contrast documented no interatrial shunt .  There is borderline left ventricular hypertrophy.  The left ventricular ejection fraction is normal (60-65%).  The left ventricular wall motion is normal.  Right ventricular systolic pressure is normal.  The aortic valve is trileaflet.  Grade I mild diastolic dysfunction; abnormal relaxation pattern.  There is mild-moderate (1-2+) mitral regurgitation.   Assessment and Plan  1.Chronic sysotlic HF - mild LV dysfunction  by echo  - she is  euvolemic, LE edema markedly improved - given LVEF is 45-50% would continue beta blocker and ARB, do not see strong indication to start entrseto, aldactone, SLGT2i      2. Palpitations - doing well on toprol, continue       3. HTN - at goal, continue current meds  4. Chest pain - improved with daily antacid, followed by GI - recent stress test without signisficnat ischemia.         Arnoldo Lenis, M.D.

## 2021-07-02 NOTE — Patient Instructions (Signed)

## 2021-07-15 ENCOUNTER — Other Ambulatory Visit: Payer: Self-pay | Admitting: Cardiology

## 2021-07-18 DIAGNOSIS — K219 Gastro-esophageal reflux disease without esophagitis: Secondary | ICD-10-CM | POA: Diagnosis not present

## 2021-07-18 DIAGNOSIS — R112 Nausea with vomiting, unspecified: Secondary | ICD-10-CM | POA: Diagnosis not present

## 2021-07-18 DIAGNOSIS — Z86711 Personal history of pulmonary embolism: Secondary | ICD-10-CM | POA: Diagnosis not present

## 2021-07-18 DIAGNOSIS — Z7901 Long term (current) use of anticoagulants: Secondary | ICD-10-CM | POA: Diagnosis not present

## 2021-07-18 DIAGNOSIS — R2689 Other abnormalities of gait and mobility: Secondary | ICD-10-CM | POA: Diagnosis not present

## 2021-07-18 DIAGNOSIS — R109 Unspecified abdominal pain: Secondary | ICD-10-CM | POA: Diagnosis not present

## 2021-07-18 DIAGNOSIS — E1169 Type 2 diabetes mellitus with other specified complication: Secondary | ICD-10-CM | POA: Diagnosis not present

## 2021-07-18 DIAGNOSIS — I700 Atherosclerosis of aorta: Secondary | ICD-10-CM | POA: Diagnosis not present

## 2021-07-18 DIAGNOSIS — I5021 Acute systolic (congestive) heart failure: Secondary | ICD-10-CM | POA: Diagnosis not present

## 2021-07-18 DIAGNOSIS — Z794 Long term (current) use of insulin: Secondary | ICD-10-CM | POA: Diagnosis not present

## 2021-07-18 DIAGNOSIS — R634 Abnormal weight loss: Secondary | ICD-10-CM | POA: Diagnosis not present

## 2021-07-18 DIAGNOSIS — E1122 Type 2 diabetes mellitus with diabetic chronic kidney disease: Secondary | ICD-10-CM | POA: Diagnosis not present

## 2021-07-18 DIAGNOSIS — D72829 Elevated white blood cell count, unspecified: Secondary | ICD-10-CM | POA: Diagnosis not present

## 2021-07-18 DIAGNOSIS — E782 Mixed hyperlipidemia: Secondary | ICD-10-CM | POA: Diagnosis not present

## 2021-07-18 DIAGNOSIS — R531 Weakness: Secondary | ICD-10-CM | POA: Diagnosis not present

## 2021-07-18 DIAGNOSIS — E860 Dehydration: Secondary | ICD-10-CM | POA: Diagnosis not present

## 2021-07-18 DIAGNOSIS — Z299 Encounter for prophylactic measures, unspecified: Secondary | ICD-10-CM | POA: Diagnosis not present

## 2021-07-18 DIAGNOSIS — R42 Dizziness and giddiness: Secondary | ICD-10-CM | POA: Diagnosis not present

## 2021-07-18 DIAGNOSIS — G301 Alzheimer's disease with late onset: Secondary | ICD-10-CM | POA: Diagnosis not present

## 2021-07-18 DIAGNOSIS — R4789 Other speech disturbances: Secondary | ICD-10-CM | POA: Diagnosis not present

## 2021-07-18 DIAGNOSIS — N179 Acute kidney failure, unspecified: Secondary | ICD-10-CM | POA: Diagnosis not present

## 2021-07-18 DIAGNOSIS — N182 Chronic kidney disease, stage 2 (mild): Secondary | ICD-10-CM | POA: Diagnosis not present

## 2021-07-18 DIAGNOSIS — E119 Type 2 diabetes mellitus without complications: Secondary | ICD-10-CM | POA: Diagnosis not present

## 2021-07-18 DIAGNOSIS — Z7982 Long term (current) use of aspirin: Secondary | ICD-10-CM | POA: Diagnosis not present

## 2021-07-18 DIAGNOSIS — I10 Essential (primary) hypertension: Secondary | ICD-10-CM | POA: Diagnosis not present

## 2021-07-18 DIAGNOSIS — Z66 Do not resuscitate: Secondary | ICD-10-CM | POA: Diagnosis not present

## 2021-07-18 DIAGNOSIS — E1165 Type 2 diabetes mellitus with hyperglycemia: Secondary | ICD-10-CM | POA: Diagnosis not present

## 2021-07-18 DIAGNOSIS — E1143 Type 2 diabetes mellitus with diabetic autonomic (poly)neuropathy: Secondary | ICD-10-CM | POA: Diagnosis not present

## 2021-07-18 DIAGNOSIS — M6281 Muscle weakness (generalized): Secondary | ICD-10-CM | POA: Diagnosis not present

## 2021-07-18 DIAGNOSIS — R197 Diarrhea, unspecified: Secondary | ICD-10-CM | POA: Diagnosis not present

## 2021-07-18 DIAGNOSIS — Z20822 Contact with and (suspected) exposure to covid-19: Secondary | ICD-10-CM | POA: Diagnosis not present

## 2021-07-23 ENCOUNTER — Ambulatory Visit (INDEPENDENT_AMBULATORY_CARE_PROVIDER_SITE_OTHER): Payer: Medicare Other | Admitting: Gastroenterology

## 2021-07-25 DIAGNOSIS — R0602 Shortness of breath: Secondary | ICD-10-CM | POA: Diagnosis not present

## 2021-07-25 DIAGNOSIS — Z20822 Contact with and (suspected) exposure to covid-19: Secondary | ICD-10-CM | POA: Diagnosis not present

## 2021-07-25 DIAGNOSIS — N179 Acute kidney failure, unspecified: Secondary | ICD-10-CM | POA: Diagnosis not present

## 2021-07-25 DIAGNOSIS — I509 Heart failure, unspecified: Secondary | ICD-10-CM | POA: Diagnosis not present

## 2021-07-25 DIAGNOSIS — Z66 Do not resuscitate: Secondary | ICD-10-CM | POA: Diagnosis not present

## 2021-07-25 DIAGNOSIS — E1143 Type 2 diabetes mellitus with diabetic autonomic (poly)neuropathy: Secondary | ICD-10-CM | POA: Diagnosis not present

## 2021-07-25 DIAGNOSIS — I5021 Acute systolic (congestive) heart failure: Secondary | ICD-10-CM | POA: Diagnosis not present

## 2021-07-25 DIAGNOSIS — R000 Tachycardia, unspecified: Secondary | ICD-10-CM | POA: Diagnosis not present

## 2021-07-25 DIAGNOSIS — Z7982 Long term (current) use of aspirin: Secondary | ICD-10-CM | POA: Diagnosis not present

## 2021-07-25 DIAGNOSIS — G301 Alzheimer's disease with late onset: Secondary | ICD-10-CM | POA: Diagnosis not present

## 2021-07-25 DIAGNOSIS — Z794 Long term (current) use of insulin: Secondary | ICD-10-CM | POA: Diagnosis not present

## 2021-07-25 DIAGNOSIS — I499 Cardiac arrhythmia, unspecified: Secondary | ICD-10-CM | POA: Diagnosis not present

## 2021-07-25 DIAGNOSIS — K3184 Gastroparesis: Secondary | ICD-10-CM | POA: Diagnosis not present

## 2021-07-25 DIAGNOSIS — R0902 Hypoxemia: Secondary | ICD-10-CM | POA: Diagnosis not present

## 2021-07-25 DIAGNOSIS — R0600 Dyspnea, unspecified: Secondary | ICD-10-CM | POA: Diagnosis not present

## 2021-07-25 DIAGNOSIS — D72829 Elevated white blood cell count, unspecified: Secondary | ICD-10-CM | POA: Diagnosis not present

## 2021-07-25 DIAGNOSIS — E871 Hypo-osmolality and hyponatremia: Secondary | ICD-10-CM | POA: Diagnosis not present

## 2021-07-25 DIAGNOSIS — Z299 Encounter for prophylactic measures, unspecified: Secondary | ICD-10-CM | POA: Diagnosis not present

## 2021-07-25 DIAGNOSIS — D6869 Other thrombophilia: Secondary | ICD-10-CM | POA: Diagnosis not present

## 2021-07-25 DIAGNOSIS — D649 Anemia, unspecified: Secondary | ICD-10-CM | POA: Diagnosis not present

## 2021-07-25 DIAGNOSIS — E782 Mixed hyperlipidemia: Secondary | ICD-10-CM | POA: Diagnosis not present

## 2021-07-25 DIAGNOSIS — I110 Hypertensive heart disease with heart failure: Secondary | ICD-10-CM | POA: Diagnosis not present

## 2021-07-25 DIAGNOSIS — R2689 Other abnormalities of gait and mobility: Secondary | ICD-10-CM | POA: Diagnosis not present

## 2021-07-25 DIAGNOSIS — R112 Nausea with vomiting, unspecified: Secondary | ICD-10-CM | POA: Diagnosis not present

## 2021-07-25 DIAGNOSIS — D692 Other nonthrombocytopenic purpura: Secondary | ICD-10-CM | POA: Diagnosis not present

## 2021-07-25 DIAGNOSIS — M6281 Muscle weakness (generalized): Secondary | ICD-10-CM | POA: Diagnosis not present

## 2021-07-25 DIAGNOSIS — Z6823 Body mass index (BMI) 23.0-23.9, adult: Secondary | ICD-10-CM | POA: Diagnosis not present

## 2021-07-25 DIAGNOSIS — R4789 Other speech disturbances: Secondary | ICD-10-CM | POA: Diagnosis not present

## 2021-07-25 DIAGNOSIS — Z789 Other specified health status: Secondary | ICD-10-CM | POA: Diagnosis not present

## 2021-07-25 DIAGNOSIS — I10 Essential (primary) hypertension: Secondary | ICD-10-CM | POA: Diagnosis not present

## 2021-07-25 DIAGNOSIS — E1165 Type 2 diabetes mellitus with hyperglycemia: Secondary | ICD-10-CM | POA: Diagnosis not present

## 2021-07-25 DIAGNOSIS — E8770 Fluid overload, unspecified: Secondary | ICD-10-CM | POA: Diagnosis not present

## 2021-07-25 DIAGNOSIS — K219 Gastro-esophageal reflux disease without esophagitis: Secondary | ICD-10-CM | POA: Diagnosis not present

## 2021-07-25 DIAGNOSIS — Z86711 Personal history of pulmonary embolism: Secondary | ICD-10-CM | POA: Diagnosis not present

## 2021-07-25 DIAGNOSIS — Z7901 Long term (current) use of anticoagulants: Secondary | ICD-10-CM | POA: Diagnosis not present

## 2021-07-25 DIAGNOSIS — E860 Dehydration: Secondary | ICD-10-CM | POA: Diagnosis not present

## 2021-07-26 DIAGNOSIS — E1165 Type 2 diabetes mellitus with hyperglycemia: Secondary | ICD-10-CM | POA: Diagnosis not present

## 2021-07-26 DIAGNOSIS — R112 Nausea with vomiting, unspecified: Secondary | ICD-10-CM | POA: Diagnosis not present

## 2021-07-28 ENCOUNTER — Telehealth (INDEPENDENT_AMBULATORY_CARE_PROVIDER_SITE_OTHER): Payer: Self-pay | Admitting: Internal Medicine

## 2021-07-28 NOTE — Telephone Encounter (Signed)
I called patient's daughter tomorrow to go over the blood work from 07/05/2021 ?I called 4 days ago but there was no answer. ? ?Serum iron is 67, TIBC 407 and saturation is 16%.  Serum ferritin 83. ?7 tissue transglutaminase IgA antibody is less than 2 ?Deamidated gliadin IgA antibody is normal at 2. ?Serum IgA is low at 49. 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 ?Dr. Jenetta Downer wanted to have celiac disease panel and serum immunoglobulins but she never did. ? ?I have sent a message to Dr. Woody Seller to to begin gluten-free diet. ?I will arrange to see her on August 16, 2021. ?If her diarrhea and bloating does not improve with gluten-free diet would consider putting her back on diet containing gluten for few weeks and then rebiopsy her. ?

## 2021-07-30 ENCOUNTER — Telehealth (INDEPENDENT_AMBULATORY_CARE_PROVIDER_SITE_OTHER): Payer: Self-pay | Admitting: *Deleted

## 2021-07-30 NOTE — Telephone Encounter (Signed)
Dr. Laural Golden called me and states he spoke with pt's daughter and patient has appt with Dr. Jenetta Downer on 4/20 and daughter asked if Dr. Laural Golden would see her one more time before he retires and he told her he could see her on the 4/27.  ?

## 2021-07-31 ENCOUNTER — Ambulatory Visit (INDEPENDENT_AMBULATORY_CARE_PROVIDER_SITE_OTHER): Payer: Medicare Other | Admitting: Internal Medicine

## 2021-08-02 DIAGNOSIS — Z86711 Personal history of pulmonary embolism: Secondary | ICD-10-CM | POA: Diagnosis not present

## 2021-08-02 DIAGNOSIS — Z299 Encounter for prophylactic measures, unspecified: Secondary | ICD-10-CM | POA: Diagnosis not present

## 2021-08-02 DIAGNOSIS — K3184 Gastroparesis: Secondary | ICD-10-CM | POA: Diagnosis not present

## 2021-08-02 DIAGNOSIS — E1143 Type 2 diabetes mellitus with diabetic autonomic (poly)neuropathy: Secondary | ICD-10-CM | POA: Diagnosis not present

## 2021-08-02 DIAGNOSIS — D649 Anemia, unspecified: Secondary | ICD-10-CM | POA: Diagnosis not present

## 2021-08-02 DIAGNOSIS — I10 Essential (primary) hypertension: Secondary | ICD-10-CM | POA: Diagnosis not present

## 2021-08-03 DIAGNOSIS — D649 Anemia, unspecified: Secondary | ICD-10-CM | POA: Diagnosis not present

## 2021-08-06 DIAGNOSIS — Z7982 Long term (current) use of aspirin: Secondary | ICD-10-CM | POA: Diagnosis not present

## 2021-08-06 DIAGNOSIS — E1165 Type 2 diabetes mellitus with hyperglycemia: Secondary | ICD-10-CM | POA: Diagnosis not present

## 2021-08-06 DIAGNOSIS — Z66 Do not resuscitate: Secondary | ICD-10-CM | POA: Diagnosis not present

## 2021-08-06 DIAGNOSIS — R2689 Other abnormalities of gait and mobility: Secondary | ICD-10-CM | POA: Diagnosis not present

## 2021-08-06 DIAGNOSIS — K219 Gastro-esophageal reflux disease without esophagitis: Secondary | ICD-10-CM | POA: Diagnosis not present

## 2021-08-06 DIAGNOSIS — I110 Hypertensive heart disease with heart failure: Secondary | ICD-10-CM | POA: Diagnosis not present

## 2021-08-06 DIAGNOSIS — Z7901 Long term (current) use of anticoagulants: Secondary | ICD-10-CM | POA: Diagnosis not present

## 2021-08-06 DIAGNOSIS — Z794 Long term (current) use of insulin: Secondary | ICD-10-CM | POA: Diagnosis not present

## 2021-08-06 DIAGNOSIS — E8770 Fluid overload, unspecified: Secondary | ICD-10-CM | POA: Diagnosis not present

## 2021-08-06 DIAGNOSIS — R0902 Hypoxemia: Secondary | ICD-10-CM | POA: Diagnosis not present

## 2021-08-06 DIAGNOSIS — Z20822 Contact with and (suspected) exposure to covid-19: Secondary | ICD-10-CM | POA: Diagnosis not present

## 2021-08-06 DIAGNOSIS — R0602 Shortness of breath: Secondary | ICD-10-CM | POA: Diagnosis not present

## 2021-08-06 DIAGNOSIS — R0600 Dyspnea, unspecified: Secondary | ICD-10-CM | POA: Diagnosis not present

## 2021-08-06 DIAGNOSIS — I5021 Acute systolic (congestive) heart failure: Secondary | ICD-10-CM | POA: Diagnosis not present

## 2021-08-06 DIAGNOSIS — E871 Hypo-osmolality and hyponatremia: Secondary | ICD-10-CM | POA: Diagnosis not present

## 2021-08-09 ENCOUNTER — Ambulatory Visit (INDEPENDENT_AMBULATORY_CARE_PROVIDER_SITE_OTHER): Payer: Medicare Other | Admitting: Gastroenterology

## 2021-08-16 DIAGNOSIS — I499 Cardiac arrhythmia, unspecified: Secondary | ICD-10-CM | POA: Diagnosis not present

## 2021-08-16 DIAGNOSIS — Z6823 Body mass index (BMI) 23.0-23.9, adult: Secondary | ICD-10-CM | POA: Diagnosis not present

## 2021-08-16 DIAGNOSIS — I10 Essential (primary) hypertension: Secondary | ICD-10-CM | POA: Diagnosis not present

## 2021-08-16 DIAGNOSIS — Z299 Encounter for prophylactic measures, unspecified: Secondary | ICD-10-CM | POA: Diagnosis not present

## 2021-08-16 DIAGNOSIS — Z789 Other specified health status: Secondary | ICD-10-CM | POA: Diagnosis not present

## 2021-08-21 DIAGNOSIS — Z299 Encounter for prophylactic measures, unspecified: Secondary | ICD-10-CM | POA: Diagnosis not present

## 2021-08-21 DIAGNOSIS — E1165 Type 2 diabetes mellitus with hyperglycemia: Secondary | ICD-10-CM | POA: Diagnosis not present

## 2021-08-21 DIAGNOSIS — E1143 Type 2 diabetes mellitus with diabetic autonomic (poly)neuropathy: Secondary | ICD-10-CM | POA: Diagnosis not present

## 2021-08-21 DIAGNOSIS — D692 Other nonthrombocytopenic purpura: Secondary | ICD-10-CM | POA: Diagnosis not present

## 2021-08-21 DIAGNOSIS — K3184 Gastroparesis: Secondary | ICD-10-CM | POA: Diagnosis not present

## 2021-08-23 DIAGNOSIS — I10 Essential (primary) hypertension: Secondary | ICD-10-CM | POA: Diagnosis not present

## 2021-08-23 DIAGNOSIS — E1165 Type 2 diabetes mellitus with hyperglycemia: Secondary | ICD-10-CM | POA: Diagnosis not present

## 2021-08-23 DIAGNOSIS — I509 Heart failure, unspecified: Secondary | ICD-10-CM | POA: Diagnosis not present

## 2021-08-23 DIAGNOSIS — Z299 Encounter for prophylactic measures, unspecified: Secondary | ICD-10-CM | POA: Diagnosis not present

## 2021-08-23 DIAGNOSIS — D6869 Other thrombophilia: Secondary | ICD-10-CM | POA: Diagnosis not present

## 2021-08-28 DIAGNOSIS — G301 Alzheimer's disease with late onset: Secondary | ICD-10-CM | POA: Diagnosis not present

## 2021-08-28 DIAGNOSIS — I110 Hypertensive heart disease with heart failure: Secondary | ICD-10-CM | POA: Diagnosis not present

## 2021-08-28 DIAGNOSIS — R112 Nausea with vomiting, unspecified: Secondary | ICD-10-CM | POA: Diagnosis not present

## 2021-08-28 DIAGNOSIS — D692 Other nonthrombocytopenic purpura: Secondary | ICD-10-CM | POA: Diagnosis not present

## 2021-08-28 DIAGNOSIS — D6869 Other thrombophilia: Secondary | ICD-10-CM | POA: Diagnosis not present

## 2021-08-28 DIAGNOSIS — Z7985 Long-term (current) use of injectable non-insulin antidiabetic drugs: Secondary | ICD-10-CM | POA: Diagnosis not present

## 2021-08-28 DIAGNOSIS — E1142 Type 2 diabetes mellitus with diabetic polyneuropathy: Secondary | ICD-10-CM | POA: Diagnosis not present

## 2021-08-28 DIAGNOSIS — Z7982 Long term (current) use of aspirin: Secondary | ICD-10-CM | POA: Diagnosis not present

## 2021-08-28 DIAGNOSIS — Z794 Long term (current) use of insulin: Secondary | ICD-10-CM | POA: Diagnosis not present

## 2021-08-28 DIAGNOSIS — Z7984 Long term (current) use of oral hypoglycemic drugs: Secondary | ICD-10-CM | POA: Diagnosis not present

## 2021-08-28 DIAGNOSIS — N3281 Overactive bladder: Secondary | ICD-10-CM | POA: Diagnosis not present

## 2021-08-28 DIAGNOSIS — Z7901 Long term (current) use of anticoagulants: Secondary | ICD-10-CM | POA: Diagnosis not present

## 2021-08-28 DIAGNOSIS — E785 Hyperlipidemia, unspecified: Secondary | ICD-10-CM | POA: Diagnosis not present

## 2021-08-28 DIAGNOSIS — F0284 Dementia in other diseases classified elsewhere, unspecified severity, with anxiety: Secondary | ICD-10-CM | POA: Diagnosis not present

## 2021-08-28 DIAGNOSIS — I509 Heart failure, unspecified: Secondary | ICD-10-CM | POA: Diagnosis not present

## 2021-08-28 DIAGNOSIS — E860 Dehydration: Secondary | ICD-10-CM | POA: Diagnosis not present

## 2021-08-28 DIAGNOSIS — R197 Diarrhea, unspecified: Secondary | ICD-10-CM | POA: Diagnosis not present

## 2021-08-28 DIAGNOSIS — N179 Acute kidney failure, unspecified: Secondary | ICD-10-CM | POA: Diagnosis not present

## 2021-08-28 DIAGNOSIS — F0283 Dementia in other diseases classified elsewhere, unspecified severity, with mood disturbance: Secondary | ICD-10-CM | POA: Diagnosis not present

## 2021-08-28 DIAGNOSIS — M1990 Unspecified osteoarthritis, unspecified site: Secondary | ICD-10-CM | POA: Diagnosis not present

## 2021-08-28 DIAGNOSIS — E1165 Type 2 diabetes mellitus with hyperglycemia: Secondary | ICD-10-CM | POA: Diagnosis not present

## 2021-08-29 DIAGNOSIS — R112 Nausea with vomiting, unspecified: Secondary | ICD-10-CM | POA: Diagnosis not present

## 2021-08-29 DIAGNOSIS — G301 Alzheimer's disease with late onset: Secondary | ICD-10-CM | POA: Diagnosis not present

## 2021-08-29 DIAGNOSIS — E1165 Type 2 diabetes mellitus with hyperglycemia: Secondary | ICD-10-CM | POA: Diagnosis not present

## 2021-08-29 DIAGNOSIS — R197 Diarrhea, unspecified: Secondary | ICD-10-CM | POA: Diagnosis not present

## 2021-08-29 DIAGNOSIS — F0283 Dementia in other diseases classified elsewhere, unspecified severity, with mood disturbance: Secondary | ICD-10-CM | POA: Diagnosis not present

## 2021-08-29 DIAGNOSIS — Z7901 Long term (current) use of anticoagulants: Secondary | ICD-10-CM | POA: Diagnosis not present

## 2021-08-29 DIAGNOSIS — I110 Hypertensive heart disease with heart failure: Secondary | ICD-10-CM | POA: Diagnosis not present

## 2021-08-29 DIAGNOSIS — M1990 Unspecified osteoarthritis, unspecified site: Secondary | ICD-10-CM | POA: Diagnosis not present

## 2021-08-29 DIAGNOSIS — E860 Dehydration: Secondary | ICD-10-CM | POA: Diagnosis not present

## 2021-08-29 DIAGNOSIS — F0284 Dementia in other diseases classified elsewhere, unspecified severity, with anxiety: Secondary | ICD-10-CM | POA: Diagnosis not present

## 2021-08-29 DIAGNOSIS — D6869 Other thrombophilia: Secondary | ICD-10-CM | POA: Diagnosis not present

## 2021-08-29 DIAGNOSIS — Z7985 Long-term (current) use of injectable non-insulin antidiabetic drugs: Secondary | ICD-10-CM | POA: Diagnosis not present

## 2021-08-29 DIAGNOSIS — I509 Heart failure, unspecified: Secondary | ICD-10-CM | POA: Diagnosis not present

## 2021-08-29 DIAGNOSIS — Z7984 Long term (current) use of oral hypoglycemic drugs: Secondary | ICD-10-CM | POA: Diagnosis not present

## 2021-08-29 DIAGNOSIS — N3281 Overactive bladder: Secondary | ICD-10-CM | POA: Diagnosis not present

## 2021-08-29 DIAGNOSIS — E785 Hyperlipidemia, unspecified: Secondary | ICD-10-CM | POA: Diagnosis not present

## 2021-08-29 DIAGNOSIS — Z794 Long term (current) use of insulin: Secondary | ICD-10-CM | POA: Diagnosis not present

## 2021-08-29 DIAGNOSIS — D692 Other nonthrombocytopenic purpura: Secondary | ICD-10-CM | POA: Diagnosis not present

## 2021-08-29 DIAGNOSIS — E1142 Type 2 diabetes mellitus with diabetic polyneuropathy: Secondary | ICD-10-CM | POA: Diagnosis not present

## 2021-08-29 DIAGNOSIS — N179 Acute kidney failure, unspecified: Secondary | ICD-10-CM | POA: Diagnosis not present

## 2021-08-29 DIAGNOSIS — Z7982 Long term (current) use of aspirin: Secondary | ICD-10-CM | POA: Diagnosis not present

## 2021-08-30 DIAGNOSIS — E860 Dehydration: Secondary | ICD-10-CM | POA: Diagnosis not present

## 2021-08-30 DIAGNOSIS — M1990 Unspecified osteoarthritis, unspecified site: Secondary | ICD-10-CM | POA: Diagnosis not present

## 2021-08-30 DIAGNOSIS — Z7982 Long term (current) use of aspirin: Secondary | ICD-10-CM | POA: Diagnosis not present

## 2021-08-30 DIAGNOSIS — Z7901 Long term (current) use of anticoagulants: Secondary | ICD-10-CM | POA: Diagnosis not present

## 2021-08-30 DIAGNOSIS — G301 Alzheimer's disease with late onset: Secondary | ICD-10-CM | POA: Diagnosis not present

## 2021-08-30 DIAGNOSIS — N3281 Overactive bladder: Secondary | ICD-10-CM | POA: Diagnosis not present

## 2021-08-30 DIAGNOSIS — E1165 Type 2 diabetes mellitus with hyperglycemia: Secondary | ICD-10-CM | POA: Diagnosis not present

## 2021-08-30 DIAGNOSIS — D692 Other nonthrombocytopenic purpura: Secondary | ICD-10-CM | POA: Diagnosis not present

## 2021-08-30 DIAGNOSIS — I509 Heart failure, unspecified: Secondary | ICD-10-CM | POA: Diagnosis not present

## 2021-08-30 DIAGNOSIS — F0283 Dementia in other diseases classified elsewhere, unspecified severity, with mood disturbance: Secondary | ICD-10-CM | POA: Diagnosis not present

## 2021-08-30 DIAGNOSIS — Z7985 Long-term (current) use of injectable non-insulin antidiabetic drugs: Secondary | ICD-10-CM | POA: Diagnosis not present

## 2021-08-30 DIAGNOSIS — D6869 Other thrombophilia: Secondary | ICD-10-CM | POA: Diagnosis not present

## 2021-08-30 DIAGNOSIS — N179 Acute kidney failure, unspecified: Secondary | ICD-10-CM | POA: Diagnosis not present

## 2021-08-30 DIAGNOSIS — R112 Nausea with vomiting, unspecified: Secondary | ICD-10-CM | POA: Diagnosis not present

## 2021-08-30 DIAGNOSIS — F0284 Dementia in other diseases classified elsewhere, unspecified severity, with anxiety: Secondary | ICD-10-CM | POA: Diagnosis not present

## 2021-08-30 DIAGNOSIS — R197 Diarrhea, unspecified: Secondary | ICD-10-CM | POA: Diagnosis not present

## 2021-08-30 DIAGNOSIS — E1142 Type 2 diabetes mellitus with diabetic polyneuropathy: Secondary | ICD-10-CM | POA: Diagnosis not present

## 2021-08-30 DIAGNOSIS — Z7984 Long term (current) use of oral hypoglycemic drugs: Secondary | ICD-10-CM | POA: Diagnosis not present

## 2021-08-30 DIAGNOSIS — E785 Hyperlipidemia, unspecified: Secondary | ICD-10-CM | POA: Diagnosis not present

## 2021-08-30 DIAGNOSIS — Z794 Long term (current) use of insulin: Secondary | ICD-10-CM | POA: Diagnosis not present

## 2021-08-30 DIAGNOSIS — I110 Hypertensive heart disease with heart failure: Secondary | ICD-10-CM | POA: Diagnosis not present

## 2021-09-03 DIAGNOSIS — E1165 Type 2 diabetes mellitus with hyperglycemia: Secondary | ICD-10-CM | POA: Diagnosis not present

## 2021-09-03 DIAGNOSIS — R112 Nausea with vomiting, unspecified: Secondary | ICD-10-CM | POA: Diagnosis not present

## 2021-09-03 DIAGNOSIS — R197 Diarrhea, unspecified: Secondary | ICD-10-CM | POA: Diagnosis not present

## 2021-09-03 DIAGNOSIS — Z7985 Long-term (current) use of injectable non-insulin antidiabetic drugs: Secondary | ICD-10-CM | POA: Diagnosis not present

## 2021-09-03 DIAGNOSIS — F0284 Dementia in other diseases classified elsewhere, unspecified severity, with anxiety: Secondary | ICD-10-CM | POA: Diagnosis not present

## 2021-09-03 DIAGNOSIS — E860 Dehydration: Secondary | ICD-10-CM | POA: Diagnosis not present

## 2021-09-03 DIAGNOSIS — Z7984 Long term (current) use of oral hypoglycemic drugs: Secondary | ICD-10-CM | POA: Diagnosis not present

## 2021-09-03 DIAGNOSIS — Z794 Long term (current) use of insulin: Secondary | ICD-10-CM | POA: Diagnosis not present

## 2021-09-03 DIAGNOSIS — G301 Alzheimer's disease with late onset: Secondary | ICD-10-CM | POA: Diagnosis not present

## 2021-09-03 DIAGNOSIS — I509 Heart failure, unspecified: Secondary | ICD-10-CM | POA: Diagnosis not present

## 2021-09-03 DIAGNOSIS — N179 Acute kidney failure, unspecified: Secondary | ICD-10-CM | POA: Diagnosis not present

## 2021-09-03 DIAGNOSIS — Z7982 Long term (current) use of aspirin: Secondary | ICD-10-CM | POA: Diagnosis not present

## 2021-09-03 DIAGNOSIS — E785 Hyperlipidemia, unspecified: Secondary | ICD-10-CM | POA: Diagnosis not present

## 2021-09-03 DIAGNOSIS — F0283 Dementia in other diseases classified elsewhere, unspecified severity, with mood disturbance: Secondary | ICD-10-CM | POA: Diagnosis not present

## 2021-09-03 DIAGNOSIS — I110 Hypertensive heart disease with heart failure: Secondary | ICD-10-CM | POA: Diagnosis not present

## 2021-09-03 DIAGNOSIS — N3281 Overactive bladder: Secondary | ICD-10-CM | POA: Diagnosis not present

## 2021-09-03 DIAGNOSIS — D692 Other nonthrombocytopenic purpura: Secondary | ICD-10-CM | POA: Diagnosis not present

## 2021-09-03 DIAGNOSIS — E1142 Type 2 diabetes mellitus with diabetic polyneuropathy: Secondary | ICD-10-CM | POA: Diagnosis not present

## 2021-09-03 DIAGNOSIS — M1990 Unspecified osteoarthritis, unspecified site: Secondary | ICD-10-CM | POA: Diagnosis not present

## 2021-09-03 DIAGNOSIS — D6869 Other thrombophilia: Secondary | ICD-10-CM | POA: Diagnosis not present

## 2021-09-03 DIAGNOSIS — Z7901 Long term (current) use of anticoagulants: Secondary | ICD-10-CM | POA: Diagnosis not present

## 2021-09-04 DIAGNOSIS — Z794 Long term (current) use of insulin: Secondary | ICD-10-CM | POA: Diagnosis not present

## 2021-09-04 DIAGNOSIS — Z7984 Long term (current) use of oral hypoglycemic drugs: Secondary | ICD-10-CM | POA: Diagnosis not present

## 2021-09-04 DIAGNOSIS — M1990 Unspecified osteoarthritis, unspecified site: Secondary | ICD-10-CM | POA: Diagnosis not present

## 2021-09-04 DIAGNOSIS — R112 Nausea with vomiting, unspecified: Secondary | ICD-10-CM | POA: Diagnosis not present

## 2021-09-04 DIAGNOSIS — I509 Heart failure, unspecified: Secondary | ICD-10-CM | POA: Diagnosis not present

## 2021-09-04 DIAGNOSIS — G301 Alzheimer's disease with late onset: Secondary | ICD-10-CM | POA: Diagnosis not present

## 2021-09-04 DIAGNOSIS — E1165 Type 2 diabetes mellitus with hyperglycemia: Secondary | ICD-10-CM | POA: Diagnosis not present

## 2021-09-04 DIAGNOSIS — N179 Acute kidney failure, unspecified: Secondary | ICD-10-CM | POA: Diagnosis not present

## 2021-09-04 DIAGNOSIS — E785 Hyperlipidemia, unspecified: Secondary | ICD-10-CM | POA: Diagnosis not present

## 2021-09-04 DIAGNOSIS — Z7985 Long-term (current) use of injectable non-insulin antidiabetic drugs: Secondary | ICD-10-CM | POA: Diagnosis not present

## 2021-09-04 DIAGNOSIS — F0283 Dementia in other diseases classified elsewhere, unspecified severity, with mood disturbance: Secondary | ICD-10-CM | POA: Diagnosis not present

## 2021-09-04 DIAGNOSIS — N3281 Overactive bladder: Secondary | ICD-10-CM | POA: Diagnosis not present

## 2021-09-04 DIAGNOSIS — Z7982 Long term (current) use of aspirin: Secondary | ICD-10-CM | POA: Diagnosis not present

## 2021-09-04 DIAGNOSIS — F0284 Dementia in other diseases classified elsewhere, unspecified severity, with anxiety: Secondary | ICD-10-CM | POA: Diagnosis not present

## 2021-09-04 DIAGNOSIS — E860 Dehydration: Secondary | ICD-10-CM | POA: Diagnosis not present

## 2021-09-04 DIAGNOSIS — D692 Other nonthrombocytopenic purpura: Secondary | ICD-10-CM | POA: Diagnosis not present

## 2021-09-04 DIAGNOSIS — E1142 Type 2 diabetes mellitus with diabetic polyneuropathy: Secondary | ICD-10-CM | POA: Diagnosis not present

## 2021-09-04 DIAGNOSIS — D6869 Other thrombophilia: Secondary | ICD-10-CM | POA: Diagnosis not present

## 2021-09-04 DIAGNOSIS — I110 Hypertensive heart disease with heart failure: Secondary | ICD-10-CM | POA: Diagnosis not present

## 2021-09-04 DIAGNOSIS — R197 Diarrhea, unspecified: Secondary | ICD-10-CM | POA: Diagnosis not present

## 2021-09-04 DIAGNOSIS — Z7901 Long term (current) use of anticoagulants: Secondary | ICD-10-CM | POA: Diagnosis not present

## 2021-09-05 ENCOUNTER — Ambulatory Visit: Payer: Medicare Other | Admitting: Dermatology

## 2021-09-05 DIAGNOSIS — D649 Anemia, unspecified: Secondary | ICD-10-CM | POA: Diagnosis not present

## 2021-09-05 DIAGNOSIS — Z09 Encounter for follow-up examination after completed treatment for conditions other than malignant neoplasm: Secondary | ICD-10-CM | POA: Diagnosis not present

## 2021-09-05 DIAGNOSIS — I10 Essential (primary) hypertension: Secondary | ICD-10-CM | POA: Diagnosis not present

## 2021-09-05 DIAGNOSIS — Z6823 Body mass index (BMI) 23.0-23.9, adult: Secondary | ICD-10-CM | POA: Diagnosis not present

## 2021-09-05 DIAGNOSIS — Z299 Encounter for prophylactic measures, unspecified: Secondary | ICD-10-CM | POA: Diagnosis not present

## 2021-09-05 DIAGNOSIS — E1165 Type 2 diabetes mellitus with hyperglycemia: Secondary | ICD-10-CM | POA: Diagnosis not present

## 2021-09-05 DIAGNOSIS — I509 Heart failure, unspecified: Secondary | ICD-10-CM | POA: Diagnosis not present

## 2021-09-05 DIAGNOSIS — K900 Celiac disease: Secondary | ICD-10-CM | POA: Diagnosis not present

## 2021-09-06 DIAGNOSIS — D6869 Other thrombophilia: Secondary | ICD-10-CM | POA: Diagnosis not present

## 2021-09-06 DIAGNOSIS — Z7982 Long term (current) use of aspirin: Secondary | ICD-10-CM | POA: Diagnosis not present

## 2021-09-06 DIAGNOSIS — N3281 Overactive bladder: Secondary | ICD-10-CM | POA: Diagnosis not present

## 2021-09-06 DIAGNOSIS — F0283 Dementia in other diseases classified elsewhere, unspecified severity, with mood disturbance: Secondary | ICD-10-CM | POA: Diagnosis not present

## 2021-09-06 DIAGNOSIS — Z7985 Long-term (current) use of injectable non-insulin antidiabetic drugs: Secondary | ICD-10-CM | POA: Diagnosis not present

## 2021-09-06 DIAGNOSIS — M1990 Unspecified osteoarthritis, unspecified site: Secondary | ICD-10-CM | POA: Diagnosis not present

## 2021-09-06 DIAGNOSIS — G301 Alzheimer's disease with late onset: Secondary | ICD-10-CM | POA: Diagnosis not present

## 2021-09-06 DIAGNOSIS — E1142 Type 2 diabetes mellitus with diabetic polyneuropathy: Secondary | ICD-10-CM | POA: Diagnosis not present

## 2021-09-06 DIAGNOSIS — Z7901 Long term (current) use of anticoagulants: Secondary | ICD-10-CM | POA: Diagnosis not present

## 2021-09-06 DIAGNOSIS — R197 Diarrhea, unspecified: Secondary | ICD-10-CM | POA: Diagnosis not present

## 2021-09-06 DIAGNOSIS — D692 Other nonthrombocytopenic purpura: Secondary | ICD-10-CM | POA: Diagnosis not present

## 2021-09-06 DIAGNOSIS — E860 Dehydration: Secondary | ICD-10-CM | POA: Diagnosis not present

## 2021-09-06 DIAGNOSIS — F0284 Dementia in other diseases classified elsewhere, unspecified severity, with anxiety: Secondary | ICD-10-CM | POA: Diagnosis not present

## 2021-09-06 DIAGNOSIS — E785 Hyperlipidemia, unspecified: Secondary | ICD-10-CM | POA: Diagnosis not present

## 2021-09-06 DIAGNOSIS — R112 Nausea with vomiting, unspecified: Secondary | ICD-10-CM | POA: Diagnosis not present

## 2021-09-06 DIAGNOSIS — I509 Heart failure, unspecified: Secondary | ICD-10-CM | POA: Diagnosis not present

## 2021-09-06 DIAGNOSIS — I110 Hypertensive heart disease with heart failure: Secondary | ICD-10-CM | POA: Diagnosis not present

## 2021-09-06 DIAGNOSIS — Z7984 Long term (current) use of oral hypoglycemic drugs: Secondary | ICD-10-CM | POA: Diagnosis not present

## 2021-09-06 DIAGNOSIS — Z794 Long term (current) use of insulin: Secondary | ICD-10-CM | POA: Diagnosis not present

## 2021-09-06 DIAGNOSIS — E1165 Type 2 diabetes mellitus with hyperglycemia: Secondary | ICD-10-CM | POA: Diagnosis not present

## 2021-09-06 DIAGNOSIS — N179 Acute kidney failure, unspecified: Secondary | ICD-10-CM | POA: Diagnosis not present

## 2021-09-10 DIAGNOSIS — E1142 Type 2 diabetes mellitus with diabetic polyneuropathy: Secondary | ICD-10-CM | POA: Diagnosis not present

## 2021-09-10 DIAGNOSIS — E785 Hyperlipidemia, unspecified: Secondary | ICD-10-CM | POA: Diagnosis not present

## 2021-09-10 DIAGNOSIS — D692 Other nonthrombocytopenic purpura: Secondary | ICD-10-CM | POA: Diagnosis not present

## 2021-09-10 DIAGNOSIS — R197 Diarrhea, unspecified: Secondary | ICD-10-CM | POA: Diagnosis not present

## 2021-09-10 DIAGNOSIS — F0283 Dementia in other diseases classified elsewhere, unspecified severity, with mood disturbance: Secondary | ICD-10-CM | POA: Diagnosis not present

## 2021-09-10 DIAGNOSIS — G301 Alzheimer's disease with late onset: Secondary | ICD-10-CM | POA: Diagnosis not present

## 2021-09-10 DIAGNOSIS — E860 Dehydration: Secondary | ICD-10-CM | POA: Diagnosis not present

## 2021-09-10 DIAGNOSIS — Z7982 Long term (current) use of aspirin: Secondary | ICD-10-CM | POA: Diagnosis not present

## 2021-09-10 DIAGNOSIS — D6869 Other thrombophilia: Secondary | ICD-10-CM | POA: Diagnosis not present

## 2021-09-10 DIAGNOSIS — R112 Nausea with vomiting, unspecified: Secondary | ICD-10-CM | POA: Diagnosis not present

## 2021-09-10 DIAGNOSIS — Z7901 Long term (current) use of anticoagulants: Secondary | ICD-10-CM | POA: Diagnosis not present

## 2021-09-10 DIAGNOSIS — N3281 Overactive bladder: Secondary | ICD-10-CM | POA: Diagnosis not present

## 2021-09-10 DIAGNOSIS — F0284 Dementia in other diseases classified elsewhere, unspecified severity, with anxiety: Secondary | ICD-10-CM | POA: Diagnosis not present

## 2021-09-10 DIAGNOSIS — I509 Heart failure, unspecified: Secondary | ICD-10-CM | POA: Diagnosis not present

## 2021-09-10 DIAGNOSIS — E1165 Type 2 diabetes mellitus with hyperglycemia: Secondary | ICD-10-CM | POA: Diagnosis not present

## 2021-09-10 DIAGNOSIS — M1990 Unspecified osteoarthritis, unspecified site: Secondary | ICD-10-CM | POA: Diagnosis not present

## 2021-09-10 DIAGNOSIS — N179 Acute kidney failure, unspecified: Secondary | ICD-10-CM | POA: Diagnosis not present

## 2021-09-10 DIAGNOSIS — I110 Hypertensive heart disease with heart failure: Secondary | ICD-10-CM | POA: Diagnosis not present

## 2021-09-10 DIAGNOSIS — Z794 Long term (current) use of insulin: Secondary | ICD-10-CM | POA: Diagnosis not present

## 2021-09-10 DIAGNOSIS — Z7985 Long-term (current) use of injectable non-insulin antidiabetic drugs: Secondary | ICD-10-CM | POA: Diagnosis not present

## 2021-09-10 DIAGNOSIS — Z7984 Long term (current) use of oral hypoglycemic drugs: Secondary | ICD-10-CM | POA: Diagnosis not present

## 2021-09-12 DIAGNOSIS — Z7982 Long term (current) use of aspirin: Secondary | ICD-10-CM | POA: Diagnosis not present

## 2021-09-12 DIAGNOSIS — E860 Dehydration: Secondary | ICD-10-CM | POA: Diagnosis not present

## 2021-09-12 DIAGNOSIS — M1990 Unspecified osteoarthritis, unspecified site: Secondary | ICD-10-CM | POA: Diagnosis not present

## 2021-09-12 DIAGNOSIS — E785 Hyperlipidemia, unspecified: Secondary | ICD-10-CM | POA: Diagnosis not present

## 2021-09-12 DIAGNOSIS — E1165 Type 2 diabetes mellitus with hyperglycemia: Secondary | ICD-10-CM | POA: Diagnosis not present

## 2021-09-12 DIAGNOSIS — N3281 Overactive bladder: Secondary | ICD-10-CM | POA: Diagnosis not present

## 2021-09-12 DIAGNOSIS — Z7985 Long-term (current) use of injectable non-insulin antidiabetic drugs: Secondary | ICD-10-CM | POA: Diagnosis not present

## 2021-09-12 DIAGNOSIS — Z7901 Long term (current) use of anticoagulants: Secondary | ICD-10-CM | POA: Diagnosis not present

## 2021-09-12 DIAGNOSIS — G301 Alzheimer's disease with late onset: Secondary | ICD-10-CM | POA: Diagnosis not present

## 2021-09-12 DIAGNOSIS — N179 Acute kidney failure, unspecified: Secondary | ICD-10-CM | POA: Diagnosis not present

## 2021-09-12 DIAGNOSIS — D6869 Other thrombophilia: Secondary | ICD-10-CM | POA: Diagnosis not present

## 2021-09-12 DIAGNOSIS — R197 Diarrhea, unspecified: Secondary | ICD-10-CM | POA: Diagnosis not present

## 2021-09-12 DIAGNOSIS — E1142 Type 2 diabetes mellitus with diabetic polyneuropathy: Secondary | ICD-10-CM | POA: Diagnosis not present

## 2021-09-12 DIAGNOSIS — Z794 Long term (current) use of insulin: Secondary | ICD-10-CM | POA: Diagnosis not present

## 2021-09-12 DIAGNOSIS — Z7984 Long term (current) use of oral hypoglycemic drugs: Secondary | ICD-10-CM | POA: Diagnosis not present

## 2021-09-12 DIAGNOSIS — D692 Other nonthrombocytopenic purpura: Secondary | ICD-10-CM | POA: Diagnosis not present

## 2021-09-12 DIAGNOSIS — R112 Nausea with vomiting, unspecified: Secondary | ICD-10-CM | POA: Diagnosis not present

## 2021-09-12 DIAGNOSIS — I509 Heart failure, unspecified: Secondary | ICD-10-CM | POA: Diagnosis not present

## 2021-09-12 DIAGNOSIS — F0283 Dementia in other diseases classified elsewhere, unspecified severity, with mood disturbance: Secondary | ICD-10-CM | POA: Diagnosis not present

## 2021-09-12 DIAGNOSIS — I110 Hypertensive heart disease with heart failure: Secondary | ICD-10-CM | POA: Diagnosis not present

## 2021-09-12 DIAGNOSIS — F0284 Dementia in other diseases classified elsewhere, unspecified severity, with anxiety: Secondary | ICD-10-CM | POA: Diagnosis not present

## 2021-09-13 DIAGNOSIS — R197 Diarrhea, unspecified: Secondary | ICD-10-CM | POA: Diagnosis not present

## 2021-09-13 DIAGNOSIS — M1990 Unspecified osteoarthritis, unspecified site: Secondary | ICD-10-CM | POA: Diagnosis not present

## 2021-09-13 DIAGNOSIS — Z7984 Long term (current) use of oral hypoglycemic drugs: Secondary | ICD-10-CM | POA: Diagnosis not present

## 2021-09-13 DIAGNOSIS — E860 Dehydration: Secondary | ICD-10-CM | POA: Diagnosis not present

## 2021-09-13 DIAGNOSIS — Z794 Long term (current) use of insulin: Secondary | ICD-10-CM | POA: Diagnosis not present

## 2021-09-13 DIAGNOSIS — R112 Nausea with vomiting, unspecified: Secondary | ICD-10-CM | POA: Diagnosis not present

## 2021-09-13 DIAGNOSIS — I509 Heart failure, unspecified: Secondary | ICD-10-CM | POA: Diagnosis not present

## 2021-09-13 DIAGNOSIS — Z7901 Long term (current) use of anticoagulants: Secondary | ICD-10-CM | POA: Diagnosis not present

## 2021-09-13 DIAGNOSIS — E1142 Type 2 diabetes mellitus with diabetic polyneuropathy: Secondary | ICD-10-CM | POA: Diagnosis not present

## 2021-09-13 DIAGNOSIS — N179 Acute kidney failure, unspecified: Secondary | ICD-10-CM | POA: Diagnosis not present

## 2021-09-13 DIAGNOSIS — G301 Alzheimer's disease with late onset: Secondary | ICD-10-CM | POA: Diagnosis not present

## 2021-09-13 DIAGNOSIS — F0284 Dementia in other diseases classified elsewhere, unspecified severity, with anxiety: Secondary | ICD-10-CM | POA: Diagnosis not present

## 2021-09-13 DIAGNOSIS — N3281 Overactive bladder: Secondary | ICD-10-CM | POA: Diagnosis not present

## 2021-09-13 DIAGNOSIS — D692 Other nonthrombocytopenic purpura: Secondary | ICD-10-CM | POA: Diagnosis not present

## 2021-09-13 DIAGNOSIS — Z7982 Long term (current) use of aspirin: Secondary | ICD-10-CM | POA: Diagnosis not present

## 2021-09-13 DIAGNOSIS — I110 Hypertensive heart disease with heart failure: Secondary | ICD-10-CM | POA: Diagnosis not present

## 2021-09-13 DIAGNOSIS — F0283 Dementia in other diseases classified elsewhere, unspecified severity, with mood disturbance: Secondary | ICD-10-CM | POA: Diagnosis not present

## 2021-09-13 DIAGNOSIS — Z7985 Long-term (current) use of injectable non-insulin antidiabetic drugs: Secondary | ICD-10-CM | POA: Diagnosis not present

## 2021-09-13 DIAGNOSIS — D6869 Other thrombophilia: Secondary | ICD-10-CM | POA: Diagnosis not present

## 2021-09-13 DIAGNOSIS — E785 Hyperlipidemia, unspecified: Secondary | ICD-10-CM | POA: Diagnosis not present

## 2021-09-13 DIAGNOSIS — E1165 Type 2 diabetes mellitus with hyperglycemia: Secondary | ICD-10-CM | POA: Diagnosis not present

## 2021-09-19 DIAGNOSIS — F0284 Dementia in other diseases classified elsewhere, unspecified severity, with anxiety: Secondary | ICD-10-CM | POA: Diagnosis not present

## 2021-09-19 DIAGNOSIS — D692 Other nonthrombocytopenic purpura: Secondary | ICD-10-CM | POA: Diagnosis not present

## 2021-09-19 DIAGNOSIS — Z7982 Long term (current) use of aspirin: Secondary | ICD-10-CM | POA: Diagnosis not present

## 2021-09-19 DIAGNOSIS — Z299 Encounter for prophylactic measures, unspecified: Secondary | ICD-10-CM | POA: Diagnosis not present

## 2021-09-19 DIAGNOSIS — Z7984 Long term (current) use of oral hypoglycemic drugs: Secondary | ICD-10-CM | POA: Diagnosis not present

## 2021-09-19 DIAGNOSIS — I10 Essential (primary) hypertension: Secondary | ICD-10-CM | POA: Diagnosis not present

## 2021-09-19 DIAGNOSIS — I509 Heart failure, unspecified: Secondary | ICD-10-CM | POA: Diagnosis not present

## 2021-09-19 DIAGNOSIS — E1165 Type 2 diabetes mellitus with hyperglycemia: Secondary | ICD-10-CM | POA: Diagnosis not present

## 2021-09-19 DIAGNOSIS — M1990 Unspecified osteoarthritis, unspecified site: Secondary | ICD-10-CM | POA: Diagnosis not present

## 2021-09-19 DIAGNOSIS — M25552 Pain in left hip: Secondary | ICD-10-CM | POA: Diagnosis not present

## 2021-09-19 DIAGNOSIS — R112 Nausea with vomiting, unspecified: Secondary | ICD-10-CM | POA: Diagnosis not present

## 2021-09-19 DIAGNOSIS — N3281 Overactive bladder: Secondary | ICD-10-CM | POA: Diagnosis not present

## 2021-09-19 DIAGNOSIS — N179 Acute kidney failure, unspecified: Secondary | ICD-10-CM | POA: Diagnosis not present

## 2021-09-19 DIAGNOSIS — Z7901 Long term (current) use of anticoagulants: Secondary | ICD-10-CM | POA: Diagnosis not present

## 2021-09-19 DIAGNOSIS — G301 Alzheimer's disease with late onset: Secondary | ICD-10-CM | POA: Diagnosis not present

## 2021-09-19 DIAGNOSIS — E785 Hyperlipidemia, unspecified: Secondary | ICD-10-CM | POA: Diagnosis not present

## 2021-09-19 DIAGNOSIS — E860 Dehydration: Secondary | ICD-10-CM | POA: Diagnosis not present

## 2021-09-19 DIAGNOSIS — D6869 Other thrombophilia: Secondary | ICD-10-CM | POA: Diagnosis not present

## 2021-09-19 DIAGNOSIS — Z7985 Long-term (current) use of injectable non-insulin antidiabetic drugs: Secondary | ICD-10-CM | POA: Diagnosis not present

## 2021-09-19 DIAGNOSIS — I110 Hypertensive heart disease with heart failure: Secondary | ICD-10-CM | POA: Diagnosis not present

## 2021-09-19 DIAGNOSIS — R197 Diarrhea, unspecified: Secondary | ICD-10-CM | POA: Diagnosis not present

## 2021-09-19 DIAGNOSIS — E1142 Type 2 diabetes mellitus with diabetic polyneuropathy: Secondary | ICD-10-CM | POA: Diagnosis not present

## 2021-09-19 DIAGNOSIS — F0283 Dementia in other diseases classified elsewhere, unspecified severity, with mood disturbance: Secondary | ICD-10-CM | POA: Diagnosis not present

## 2021-09-19 DIAGNOSIS — Z794 Long term (current) use of insulin: Secondary | ICD-10-CM | POA: Diagnosis not present

## 2021-09-20 ENCOUNTER — Encounter (INDEPENDENT_AMBULATORY_CARE_PROVIDER_SITE_OTHER): Payer: Self-pay | Admitting: Gastroenterology

## 2021-09-20 ENCOUNTER — Ambulatory Visit (INDEPENDENT_AMBULATORY_CARE_PROVIDER_SITE_OTHER): Payer: Medicare Other | Admitting: Gastroenterology

## 2021-09-21 DIAGNOSIS — E1165 Type 2 diabetes mellitus with hyperglycemia: Secondary | ICD-10-CM | POA: Diagnosis not present

## 2021-09-21 DIAGNOSIS — R197 Diarrhea, unspecified: Secondary | ICD-10-CM | POA: Diagnosis not present

## 2021-09-21 DIAGNOSIS — Z794 Long term (current) use of insulin: Secondary | ICD-10-CM | POA: Diagnosis not present

## 2021-09-21 DIAGNOSIS — D6869 Other thrombophilia: Secondary | ICD-10-CM | POA: Diagnosis not present

## 2021-09-21 DIAGNOSIS — F0283 Dementia in other diseases classified elsewhere, unspecified severity, with mood disturbance: Secondary | ICD-10-CM | POA: Diagnosis not present

## 2021-09-21 DIAGNOSIS — M1990 Unspecified osteoarthritis, unspecified site: Secondary | ICD-10-CM | POA: Diagnosis not present

## 2021-09-21 DIAGNOSIS — N3281 Overactive bladder: Secondary | ICD-10-CM | POA: Diagnosis not present

## 2021-09-21 DIAGNOSIS — E785 Hyperlipidemia, unspecified: Secondary | ICD-10-CM | POA: Diagnosis not present

## 2021-09-21 DIAGNOSIS — R112 Nausea with vomiting, unspecified: Secondary | ICD-10-CM | POA: Diagnosis not present

## 2021-09-21 DIAGNOSIS — G301 Alzheimer's disease with late onset: Secondary | ICD-10-CM | POA: Diagnosis not present

## 2021-09-21 DIAGNOSIS — I509 Heart failure, unspecified: Secondary | ICD-10-CM | POA: Diagnosis not present

## 2021-09-21 DIAGNOSIS — Z7985 Long-term (current) use of injectable non-insulin antidiabetic drugs: Secondary | ICD-10-CM | POA: Diagnosis not present

## 2021-09-21 DIAGNOSIS — N179 Acute kidney failure, unspecified: Secondary | ICD-10-CM | POA: Diagnosis not present

## 2021-09-21 DIAGNOSIS — I110 Hypertensive heart disease with heart failure: Secondary | ICD-10-CM | POA: Diagnosis not present

## 2021-09-21 DIAGNOSIS — Z7982 Long term (current) use of aspirin: Secondary | ICD-10-CM | POA: Diagnosis not present

## 2021-09-21 DIAGNOSIS — F0284 Dementia in other diseases classified elsewhere, unspecified severity, with anxiety: Secondary | ICD-10-CM | POA: Diagnosis not present

## 2021-09-21 DIAGNOSIS — E860 Dehydration: Secondary | ICD-10-CM | POA: Diagnosis not present

## 2021-09-21 DIAGNOSIS — Z7901 Long term (current) use of anticoagulants: Secondary | ICD-10-CM | POA: Diagnosis not present

## 2021-09-21 DIAGNOSIS — D692 Other nonthrombocytopenic purpura: Secondary | ICD-10-CM | POA: Diagnosis not present

## 2021-09-21 DIAGNOSIS — Z7984 Long term (current) use of oral hypoglycemic drugs: Secondary | ICD-10-CM | POA: Diagnosis not present

## 2021-09-21 DIAGNOSIS — E1142 Type 2 diabetes mellitus with diabetic polyneuropathy: Secondary | ICD-10-CM | POA: Diagnosis not present

## 2021-09-28 DIAGNOSIS — R110 Nausea: Secondary | ICD-10-CM | POA: Diagnosis not present

## 2021-09-28 DIAGNOSIS — E1165 Type 2 diabetes mellitus with hyperglycemia: Secondary | ICD-10-CM | POA: Diagnosis not present

## 2021-09-28 DIAGNOSIS — Z299 Encounter for prophylactic measures, unspecified: Secondary | ICD-10-CM | POA: Diagnosis not present

## 2021-09-28 DIAGNOSIS — Z789 Other specified health status: Secondary | ICD-10-CM | POA: Diagnosis not present

## 2021-09-28 DIAGNOSIS — I10 Essential (primary) hypertension: Secondary | ICD-10-CM | POA: Diagnosis not present

## 2021-10-19 DIAGNOSIS — R350 Frequency of micturition: Secondary | ICD-10-CM | POA: Diagnosis not present

## 2021-10-19 DIAGNOSIS — I10 Essential (primary) hypertension: Secondary | ICD-10-CM | POA: Diagnosis not present

## 2021-10-19 DIAGNOSIS — Z299 Encounter for prophylactic measures, unspecified: Secondary | ICD-10-CM | POA: Diagnosis not present

## 2021-10-19 DIAGNOSIS — Z789 Other specified health status: Secondary | ICD-10-CM | POA: Diagnosis not present

## 2021-10-19 DIAGNOSIS — E1165 Type 2 diabetes mellitus with hyperglycemia: Secondary | ICD-10-CM | POA: Diagnosis not present

## 2021-10-19 DIAGNOSIS — I509 Heart failure, unspecified: Secondary | ICD-10-CM | POA: Diagnosis not present

## 2021-11-21 ENCOUNTER — Other Ambulatory Visit: Payer: Self-pay

## 2021-11-21 MED ORDER — ISOSORBIDE MONONITRATE ER 60 MG PO TB24: 60.0000 mg | ORAL_TABLET | Freq: Every day | ORAL | 6 refills | Status: DC

## 2021-11-22 ENCOUNTER — Other Ambulatory Visit: Payer: Self-pay | Admitting: *Deleted

## 2021-11-22 NOTE — Patient Outreach (Signed)
  Care Coordination   11/22/2021  Name: Sandra [name] MRN: [id number] DOB: 07/07/1940   Care Coordination Outreach Attempts:  An unsuccessful telephone outreach was attempted today to offer the patient information about available care coordination services as a benefit of their health plan. A HIPAA compliant message was left on voicemail, providing contact information for CSW, encouraging patient to return CSW's call at her earliest convenience.  Follow Up Plan:  Additional outreach attempts will be made to offer the patient care coordination information and services.   Encounter Outcome:  No Answer.    Care Coordination Interventions Activated:  No    Care Coordination Interventions:  No, not indicated.    Nat Christen, BSW, MSW, LCSW  Licensed Education officer, [employer] [facility]  Mailing [place] [address], [place], [place] [address] Physical Address-[address], [place], [place] [address] Toll Free Main # [phone] Fax # [phone] Cell # [phone] [email or web address]

## 2021-11-28 ENCOUNTER — Other Ambulatory Visit: Payer: Self-pay | Admitting: *Deleted

## 2021-11-28 NOTE — Patient Outreach (Signed)
  Care Coordination   11/28/2021  Name: Sandra [name] MRN: [id number] DOB: 31-Jul-1940   Care Coordination Outreach Attempts:  A second unsuccessful outreach was attempted today to offer the patient with information about available care coordination services as a benefit of their health plan.   HIPAA compliant message left on voicemail, providing contact information for CSW, encouraging patient to return CSW's call at their earliest convenience.   Follow Up Plan:  Additional outreach attempts will be made to offer the patient care coordination information and services.    Encounter Outcome:  No Answer.    Care Coordination Interventions Activated:  No     Care Coordination Interventions:  No, not indicated.     Nat Christen, BSW, MSW, LCSW  Licensed Education officer, [facility]  Mailing [place] [address] [place], [place] [address] Physical Address-[address] [place], [place] [address] Toll Free Main # [phone] Fax # [phone] Cell # [phone] [email or web address]

## 2021-12-05 ENCOUNTER — Ambulatory Visit: Payer: Self-pay | Admitting: *Deleted

## 2021-12-05 ENCOUNTER — Encounter: Payer: Self-pay | Admitting: *Deleted

## 2021-12-05 NOTE — Patient Outreach (Signed)
  Care Coordination   Initial Visit Note   12/05/2021  Name: KAISLYN GULAS MRN: [id number] DOB: Feb 16, 1941  OLIVIANNA HIGLEY is a 81 y.o. year old female who sees Vyas, Costella Hatcher, MD for primary care. I spoke with daughter, Brantley Fling by phone today.  What matters to the patients health and wellness today?  No Intervention Indicated.   Goals Addressed   None     SDOH assessments and interventions completed:  Yes.   SDOH Interventions Today    Flowsheet Row Most Recent Value  SDOH Interventions   Food Insecurity Interventions Intervention Not Indicated, Other (Comment)  [Verified by Daughter, Brantley Fling  Financial Strain Interventions Intervention Not Indicated, Other (Comment)  [Verified by Daughter, Brantley Fling  Housing Interventions Intervention Not Indicated, Other (Comment)  [Verified by Daughter, Brantley Fling  Physical Activity Interventions Patient Refused, Other (Comments)  [Verified by Daughter, Brantley Fling  Stress Interventions Intervention Not Indicated, Other (Comment)  [Verified by Daughter, Brantley Fling  Social Connections Interventions Intervention Not Indicated, Other (Comment)  [Verified by Daughter, Brantley Fling  Transportation Interventions Intervention Not Indicated, Other (Comment)  [Verified by Daughter, Brantley Fling        Care Coordination Interventions Activated:  Yes.   Care Coordination Interventions:  Yes, provided.   Follow up plan: No further intervention required.   Encounter Outcome:  Pt. Visit Completed.   Nat Christen, BSW, MSW, LCSW  Licensed Education officer, [facility]  Mailing [place] [address] [place], [place] [address] Physical Address-[address], [place], [place] [address] Toll Free Main # [phone] Fax # [phone] Cell # [phone] [email or web address]

## 2021-12-05 NOTE — Patient Instructions (Signed)
Visit Information  Thank you for taking time to visit with me today. Please don't hesitate to contact me if I can be of assistance to you.   Please call the care guide team at [phone] if you need to cancel or reschedule your appointment.   If you are experiencing a Mental Health or Behavioral Health Crisis or need someone to talk to, please call the Suicide and Crisis Lifeline: 988 call the [place] National Suicide Prevention Lifeline: 1-800-273-8255 or TTY: 1-800-799-4 TTY (1-800-799-4889) to talk to a trained counselor call 1-800-273-TALK (toll free, 24 hour hotline) go to [facility] [address], [place] ([phone]) call the [place] Crisis Line: 800-939-9988 call 911  Patient verbalizes understanding of instructions and care plan provided today and agrees to view in MyChart. Active MyChart status and patient understanding of how to access instructions and care plan via MyChart confirmed with patient.     No further follow up required.  Lundy Cozart, BSW, MSW, LCSW  Licensed Clinical Social Worker  [facility]  Mailing Address-[address], [place], [place] [address] Physical Address-[address], [place], [place] [address] Toll Free Main # [phone] Fax # [phone] Cell # [phone] [email or web address]            

## 2022-01-03 ENCOUNTER — Ambulatory Visit: Payer: Medicare Other | Admitting: Cardiology

## 2022-01-20 ENCOUNTER — Other Ambulatory Visit (INDEPENDENT_AMBULATORY_CARE_PROVIDER_SITE_OTHER): Payer: Self-pay | Admitting: Internal Medicine

## 2022-03-20 ENCOUNTER — Other Ambulatory Visit: Payer: Self-pay | Admitting: Cardiology

## 2022-04-12 DIAGNOSIS — Z299 Encounter for prophylactic measures, unspecified: Secondary | ICD-10-CM | POA: Diagnosis not present

## 2022-04-12 DIAGNOSIS — E1165 Type 2 diabetes mellitus with hyperglycemia: Secondary | ICD-10-CM | POA: Diagnosis not present

## 2022-04-12 DIAGNOSIS — M549 Dorsalgia, unspecified: Secondary | ICD-10-CM | POA: Diagnosis not present

## 2022-04-12 DIAGNOSIS — R079 Chest pain, unspecified: Secondary | ICD-10-CM | POA: Diagnosis not present

## 2022-04-12 DIAGNOSIS — I10 Essential (primary) hypertension: Secondary | ICD-10-CM | POA: Diagnosis not present

## 2022-04-21 DIAGNOSIS — K219 Gastro-esophageal reflux disease without esophagitis: Secondary | ICD-10-CM | POA: Diagnosis not present

## 2022-04-21 DIAGNOSIS — I10 Essential (primary) hypertension: Secondary | ICD-10-CM | POA: Diagnosis not present

## 2022-04-25 DIAGNOSIS — R079 Chest pain, unspecified: Secondary | ICD-10-CM | POA: Diagnosis not present

## 2022-04-25 DIAGNOSIS — Z299 Encounter for prophylactic measures, unspecified: Secondary | ICD-10-CM | POA: Diagnosis not present

## 2022-04-25 DIAGNOSIS — I700 Atherosclerosis of aorta: Secondary | ICD-10-CM | POA: Diagnosis not present

## 2022-04-25 DIAGNOSIS — I10 Essential (primary) hypertension: Secondary | ICD-10-CM | POA: Diagnosis not present

## 2022-04-25 DIAGNOSIS — I509 Heart failure, unspecified: Secondary | ICD-10-CM | POA: Diagnosis not present

## 2022-04-25 DIAGNOSIS — R0602 Shortness of breath: Secondary | ICD-10-CM | POA: Diagnosis not present

## 2022-04-25 DIAGNOSIS — R059 Cough, unspecified: Secondary | ICD-10-CM | POA: Diagnosis not present

## 2022-04-25 DIAGNOSIS — R0789 Other chest pain: Secondary | ICD-10-CM | POA: Diagnosis not present

## 2022-05-06 DIAGNOSIS — Z0000 Encounter for general adult medical examination without abnormal findings: Secondary | ICD-10-CM | POA: Diagnosis not present

## 2022-05-06 DIAGNOSIS — M549 Dorsalgia, unspecified: Secondary | ICD-10-CM | POA: Diagnosis not present

## 2022-05-06 DIAGNOSIS — Z299 Encounter for prophylactic measures, unspecified: Secondary | ICD-10-CM | POA: Diagnosis not present

## 2022-05-06 DIAGNOSIS — I509 Heart failure, unspecified: Secondary | ICD-10-CM | POA: Diagnosis not present

## 2022-05-06 DIAGNOSIS — Z7189 Other specified counseling: Secondary | ICD-10-CM | POA: Diagnosis not present

## 2022-05-06 DIAGNOSIS — I10 Essential (primary) hypertension: Secondary | ICD-10-CM | POA: Diagnosis not present

## 2022-05-13 DIAGNOSIS — E1165 Type 2 diabetes mellitus with hyperglycemia: Secondary | ICD-10-CM | POA: Diagnosis not present

## 2022-05-13 DIAGNOSIS — I700 Atherosclerosis of aorta: Secondary | ICD-10-CM | POA: Diagnosis not present

## 2022-05-13 DIAGNOSIS — Z299 Encounter for prophylactic measures, unspecified: Secondary | ICD-10-CM | POA: Diagnosis not present

## 2022-05-13 DIAGNOSIS — I509 Heart failure, unspecified: Secondary | ICD-10-CM | POA: Diagnosis not present

## 2022-05-13 DIAGNOSIS — I10 Essential (primary) hypertension: Secondary | ICD-10-CM | POA: Diagnosis not present

## 2022-05-21 ENCOUNTER — Telehealth: Payer: Self-pay | Admitting: Cardiology

## 2022-05-21 NOTE — Telephone Encounter (Signed)
Spoke with pt's daughter Sandra [name]- patient has been having pain on the left side of her chest going into her left arm - it started about 1 month ago.   Pt was evaluated by per PCP- Dr. Woody Seller and was treated for a lung infection and was put on medication but the pain has continued. Patient was seen again last week and was told to f/u with cardiology.  Scheduled pt to see E. [facility] Calix since she missed her f/u with Dr. Harl Bowie last year  Please call daughter, Sandra [name] @ [phone]

## 2022-05-21 NOTE — Telephone Encounter (Signed)
Patient's daughter calling to speak with Jarrett Soho. She states she wants to speak with Jarrett Soho personally and did not want to give any further information.

## 2022-05-22 DIAGNOSIS — R6520 Severe sepsis without septic shock: Secondary | ICD-10-CM | POA: Diagnosis not present

## 2022-05-22 DIAGNOSIS — R9431 Abnormal electrocardiogram [ECG] [EKG]: Secondary | ICD-10-CM | POA: Diagnosis not present

## 2022-05-22 DIAGNOSIS — R079 Chest pain, unspecified: Secondary | ICD-10-CM | POA: Diagnosis not present

## 2022-05-22 DIAGNOSIS — E119 Type 2 diabetes mellitus without complications: Secondary | ICD-10-CM | POA: Diagnosis not present

## 2022-05-22 DIAGNOSIS — F02811 Dementia in other diseases classified elsewhere, unspecified severity, with agitation: Secondary | ICD-10-CM | POA: Diagnosis not present

## 2022-05-22 DIAGNOSIS — N3090 Cystitis, unspecified without hematuria: Secondary | ICD-10-CM | POA: Diagnosis not present

## 2022-05-22 DIAGNOSIS — I5021 Acute systolic (congestive) heart failure: Secondary | ICD-10-CM | POA: Diagnosis not present

## 2022-05-22 DIAGNOSIS — I5023 Acute on chronic systolic (congestive) heart failure: Secondary | ICD-10-CM | POA: Diagnosis not present

## 2022-05-22 DIAGNOSIS — R6889 Other general symptoms and signs: Secondary | ICD-10-CM | POA: Diagnosis not present

## 2022-05-22 DIAGNOSIS — G319 Degenerative disease of nervous system, unspecified: Secondary | ICD-10-CM | POA: Diagnosis not present

## 2022-05-22 DIAGNOSIS — R778 Other specified abnormalities of plasma proteins: Secondary | ICD-10-CM | POA: Diagnosis not present

## 2022-05-22 DIAGNOSIS — I110 Hypertensive heart disease with heart failure: Secondary | ICD-10-CM | POA: Diagnosis not present

## 2022-05-22 DIAGNOSIS — I509 Heart failure, unspecified: Secondary | ICD-10-CM | POA: Diagnosis not present

## 2022-05-22 DIAGNOSIS — Z9911 Dependence on respirator [ventilator] status: Secondary | ICD-10-CM | POA: Diagnosis not present

## 2022-05-22 DIAGNOSIS — R531 Weakness: Secondary | ICD-10-CM | POA: Diagnosis not present

## 2022-05-22 DIAGNOSIS — R54 Age-related physical debility: Secondary | ICD-10-CM | POA: Diagnosis not present

## 2022-05-22 DIAGNOSIS — Z794 Long term (current) use of insulin: Secondary | ICD-10-CM | POA: Diagnosis not present

## 2022-05-22 DIAGNOSIS — M6281 Muscle weakness (generalized): Secondary | ICD-10-CM | POA: Diagnosis not present

## 2022-05-22 DIAGNOSIS — Z9049 Acquired absence of other specified parts of digestive tract: Secondary | ICD-10-CM | POA: Diagnosis not present

## 2022-05-22 DIAGNOSIS — Z9981 Dependence on supplemental oxygen: Secondary | ICD-10-CM | POA: Diagnosis not present

## 2022-05-22 DIAGNOSIS — Z4682 Encounter for fitting and adjustment of non-vascular catheter: Secondary | ICD-10-CM | POA: Diagnosis not present

## 2022-05-22 DIAGNOSIS — J90 Pleural effusion, not elsewhere classified: Secondary | ICD-10-CM | POA: Diagnosis not present

## 2022-05-22 DIAGNOSIS — Q2111 Secundum atrial septal defect: Secondary | ICD-10-CM | POA: Diagnosis not present

## 2022-05-22 DIAGNOSIS — Z7401 Bed confinement status: Secondary | ICD-10-CM | POA: Diagnosis not present

## 2022-05-22 DIAGNOSIS — E782 Mixed hyperlipidemia: Secondary | ICD-10-CM | POA: Diagnosis not present

## 2022-05-22 DIAGNOSIS — I700 Atherosclerosis of aorta: Secondary | ICD-10-CM | POA: Diagnosis not present

## 2022-05-22 DIAGNOSIS — R0902 Hypoxemia: Secondary | ICD-10-CM | POA: Diagnosis not present

## 2022-05-22 DIAGNOSIS — E1165 Type 2 diabetes mellitus with hyperglycemia: Secondary | ICD-10-CM | POA: Diagnosis not present

## 2022-05-22 DIAGNOSIS — Z792 Long term (current) use of antibiotics: Secondary | ICD-10-CM | POA: Diagnosis not present

## 2022-05-22 DIAGNOSIS — J9601 Acute respiratory failure with hypoxia: Secondary | ICD-10-CM | POA: Diagnosis not present

## 2022-05-22 DIAGNOSIS — I517 Cardiomegaly: Secondary | ICD-10-CM | POA: Diagnosis not present

## 2022-05-22 DIAGNOSIS — Z79899 Other long term (current) drug therapy: Secondary | ICD-10-CM | POA: Diagnosis not present

## 2022-05-22 DIAGNOSIS — R29898 Other symptoms and signs involving the musculoskeletal system: Secondary | ICD-10-CM | POA: Diagnosis not present

## 2022-05-22 DIAGNOSIS — R7402 Elevation of levels of lactic acid dehydrogenase (LDH): Secondary | ICD-10-CM | POA: Diagnosis not present

## 2022-05-22 DIAGNOSIS — E870 Hyperosmolality and hypernatremia: Secondary | ICD-10-CM | POA: Diagnosis not present

## 2022-05-22 DIAGNOSIS — E440 Moderate protein-calorie malnutrition: Secondary | ICD-10-CM | POA: Diagnosis not present

## 2022-05-22 DIAGNOSIS — N3001 Acute cystitis with hematuria: Secondary | ICD-10-CM | POA: Diagnosis not present

## 2022-05-22 DIAGNOSIS — J984 Other disorders of lung: Secondary | ICD-10-CM | POA: Diagnosis not present

## 2022-05-22 DIAGNOSIS — A419 Sepsis, unspecified organism: Secondary | ICD-10-CM | POA: Diagnosis not present

## 2022-05-22 DIAGNOSIS — R590 Localized enlarged lymph nodes: Secondary | ICD-10-CM | POA: Diagnosis not present

## 2022-05-22 DIAGNOSIS — R918 Other nonspecific abnormal finding of lung field: Secondary | ICD-10-CM | POA: Diagnosis not present

## 2022-05-22 DIAGNOSIS — R7989 Other specified abnormal findings of blood chemistry: Secondary | ICD-10-CM | POA: Diagnosis not present

## 2022-05-22 DIAGNOSIS — F32A Depression, unspecified: Secondary | ICD-10-CM | POA: Diagnosis not present

## 2022-05-22 DIAGNOSIS — I2510 Atherosclerotic heart disease of native coronary artery without angina pectoris: Secondary | ICD-10-CM | POA: Diagnosis not present

## 2022-05-22 DIAGNOSIS — J810 Acute pulmonary edema: Secondary | ICD-10-CM | POA: Diagnosis not present

## 2022-05-22 DIAGNOSIS — I10 Essential (primary) hypertension: Secondary | ICD-10-CM | POA: Diagnosis not present

## 2022-05-22 DIAGNOSIS — D649 Anemia, unspecified: Secondary | ICD-10-CM | POA: Diagnosis not present

## 2022-05-22 DIAGNOSIS — R2689 Other abnormalities of gait and mobility: Secondary | ICD-10-CM | POA: Diagnosis not present

## 2022-05-22 DIAGNOSIS — N390 Urinary tract infection, site not specified: Secondary | ICD-10-CM | POA: Diagnosis not present

## 2022-05-22 DIAGNOSIS — G301 Alzheimer's disease with late onset: Secondary | ICD-10-CM | POA: Diagnosis not present

## 2022-05-22 DIAGNOSIS — J811 Chronic pulmonary edema: Secondary | ICD-10-CM | POA: Diagnosis not present

## 2022-05-22 DIAGNOSIS — I4891 Unspecified atrial fibrillation: Secondary | ICD-10-CM | POA: Diagnosis not present

## 2022-05-22 DIAGNOSIS — R0789 Other chest pain: Secondary | ICD-10-CM | POA: Diagnosis not present

## 2022-05-22 DIAGNOSIS — Z66 Do not resuscitate: Secondary | ICD-10-CM | POA: Diagnosis not present

## 2022-05-22 DIAGNOSIS — K219 Gastro-esophageal reflux disease without esophagitis: Secondary | ICD-10-CM | POA: Diagnosis not present

## 2022-05-22 DIAGNOSIS — Z452 Encounter for adjustment and management of vascular access device: Secondary | ICD-10-CM | POA: Diagnosis not present

## 2022-05-22 NOTE — Telephone Encounter (Signed)
Admitted to [facility] today

## 2022-05-23 ENCOUNTER — Ambulatory Visit: Payer: Medicare Other | Admitting: Nurse Practitioner

## 2022-06-04 DIAGNOSIS — R2689 Other abnormalities of gait and mobility: Secondary | ICD-10-CM | POA: Diagnosis not present

## 2022-06-04 DIAGNOSIS — Z7982 Long term (current) use of aspirin: Secondary | ICD-10-CM | POA: Diagnosis not present

## 2022-06-04 DIAGNOSIS — Z9049 Acquired absence of other specified parts of digestive tract: Secondary | ICD-10-CM | POA: Diagnosis not present

## 2022-06-04 DIAGNOSIS — R0602 Shortness of breath: Secondary | ICD-10-CM | POA: Diagnosis not present

## 2022-06-04 DIAGNOSIS — I499 Cardiac arrhythmia, unspecified: Secondary | ICD-10-CM | POA: Diagnosis not present

## 2022-06-04 DIAGNOSIS — R6889 Other general symptoms and signs: Secondary | ICD-10-CM | POA: Diagnosis not present

## 2022-06-04 DIAGNOSIS — Z66 Do not resuscitate: Secondary | ICD-10-CM | POA: Diagnosis not present

## 2022-06-04 DIAGNOSIS — Z743 Need for continuous supervision: Secondary | ICD-10-CM | POA: Diagnosis not present

## 2022-06-04 DIAGNOSIS — R7989 Other specified abnormal findings of blood chemistry: Secondary | ICD-10-CM | POA: Diagnosis not present

## 2022-06-04 DIAGNOSIS — M6281 Muscle weakness (generalized): Secondary | ICD-10-CM | POA: Diagnosis not present

## 2022-06-04 DIAGNOSIS — G301 Alzheimer's disease with late onset: Secondary | ICD-10-CM | POA: Diagnosis not present

## 2022-06-04 DIAGNOSIS — I10 Essential (primary) hypertension: Secondary | ICD-10-CM | POA: Diagnosis not present

## 2022-06-04 DIAGNOSIS — D692 Other nonthrombocytopenic purpura: Secondary | ICD-10-CM | POA: Diagnosis not present

## 2022-06-04 DIAGNOSIS — Z299 Encounter for prophylactic measures, unspecified: Secondary | ICD-10-CM | POA: Diagnosis not present

## 2022-06-04 DIAGNOSIS — N179 Acute kidney failure, unspecified: Secondary | ICD-10-CM | POA: Diagnosis not present

## 2022-06-04 DIAGNOSIS — M25512 Pain in left shoulder: Secondary | ICD-10-CM | POA: Diagnosis not present

## 2022-06-04 DIAGNOSIS — Z7401 Bed confinement status: Secondary | ICD-10-CM | POA: Diagnosis not present

## 2022-06-04 DIAGNOSIS — E872 Acidosis, unspecified: Secondary | ICD-10-CM | POA: Diagnosis not present

## 2022-06-04 DIAGNOSIS — I493 Ventricular premature depolarization: Secondary | ICD-10-CM | POA: Diagnosis not present

## 2022-06-04 DIAGNOSIS — E440 Moderate protein-calorie malnutrition: Secondary | ICD-10-CM | POA: Diagnosis not present

## 2022-06-04 DIAGNOSIS — E1165 Type 2 diabetes mellitus with hyperglycemia: Secondary | ICD-10-CM | POA: Diagnosis not present

## 2022-06-04 DIAGNOSIS — R404 Transient alteration of awareness: Secondary | ICD-10-CM | POA: Diagnosis not present

## 2022-06-04 DIAGNOSIS — E782 Mixed hyperlipidemia: Secondary | ICD-10-CM | POA: Diagnosis not present

## 2022-06-04 DIAGNOSIS — I5021 Acute systolic (congestive) heart failure: Secondary | ICD-10-CM | POA: Diagnosis not present

## 2022-06-04 DIAGNOSIS — R0600 Dyspnea, unspecified: Secondary | ICD-10-CM | POA: Diagnosis not present

## 2022-06-04 DIAGNOSIS — Z20822 Contact with and (suspected) exposure to covid-19: Secondary | ICD-10-CM | POA: Diagnosis not present

## 2022-06-04 DIAGNOSIS — I479 Paroxysmal tachycardia, unspecified: Secondary | ICD-10-CM | POA: Diagnosis not present

## 2022-06-04 DIAGNOSIS — E119 Type 2 diabetes mellitus without complications: Secondary | ICD-10-CM | POA: Diagnosis not present

## 2022-06-04 DIAGNOSIS — D649 Anemia, unspecified: Secondary | ICD-10-CM | POA: Diagnosis not present

## 2022-06-04 DIAGNOSIS — K3184 Gastroparesis: Secondary | ICD-10-CM | POA: Diagnosis not present

## 2022-06-04 DIAGNOSIS — K219 Gastro-esophageal reflux disease without esophagitis: Secondary | ICD-10-CM | POA: Diagnosis not present

## 2022-06-04 DIAGNOSIS — Z8673 Personal history of transient ischemic attack (TIA), and cerebral infarction without residual deficits: Secondary | ICD-10-CM | POA: Diagnosis not present

## 2022-06-04 DIAGNOSIS — Z794 Long term (current) use of insulin: Secondary | ICD-10-CM | POA: Diagnosis not present

## 2022-06-04 DIAGNOSIS — I340 Nonrheumatic mitral (valve) insufficiency: Secondary | ICD-10-CM | POA: Diagnosis not present

## 2022-06-04 DIAGNOSIS — R54 Age-related physical debility: Secondary | ICD-10-CM | POA: Diagnosis not present

## 2022-06-04 DIAGNOSIS — I4891 Unspecified atrial fibrillation: Secondary | ICD-10-CM | POA: Diagnosis not present

## 2022-06-04 DIAGNOSIS — R0902 Hypoxemia: Secondary | ICD-10-CM | POA: Diagnosis not present

## 2022-06-04 DIAGNOSIS — I110 Hypertensive heart disease with heart failure: Secondary | ICD-10-CM | POA: Diagnosis not present

## 2022-06-04 DIAGNOSIS — J9621 Acute and chronic respiratory failure with hypoxia: Secondary | ICD-10-CM | POA: Diagnosis not present

## 2022-06-04 DIAGNOSIS — E1143 Type 2 diabetes mellitus with diabetic autonomic (poly)neuropathy: Secondary | ICD-10-CM | POA: Diagnosis not present

## 2022-06-04 DIAGNOSIS — E870 Hyperosmolality and hypernatremia: Secondary | ICD-10-CM | POA: Diagnosis not present

## 2022-06-06 DIAGNOSIS — K3184 Gastroparesis: Secondary | ICD-10-CM | POA: Diagnosis not present

## 2022-06-06 DIAGNOSIS — E1143 Type 2 diabetes mellitus with diabetic autonomic (poly)neuropathy: Secondary | ICD-10-CM | POA: Diagnosis not present

## 2022-06-06 DIAGNOSIS — Z299 Encounter for prophylactic measures, unspecified: Secondary | ICD-10-CM | POA: Diagnosis not present

## 2022-06-06 DIAGNOSIS — E1165 Type 2 diabetes mellitus with hyperglycemia: Secondary | ICD-10-CM | POA: Diagnosis not present

## 2022-06-06 DIAGNOSIS — D692 Other nonthrombocytopenic purpura: Secondary | ICD-10-CM | POA: Diagnosis not present

## 2022-06-06 DIAGNOSIS — I10 Essential (primary) hypertension: Secondary | ICD-10-CM | POA: Diagnosis not present

## 2022-06-07 ENCOUNTER — Other Ambulatory Visit: Payer: Self-pay | Admitting: *Deleted

## 2022-06-07 NOTE — Patient Outreach (Signed)
Per [facility] Ms. Hoek resides in [facility]. Screening for potential [facility] care coordination needs as benefit of health plan and PCP.   Update from [place], [facility] social worker indicating Ms. Wilkowski is from home with granddaughter. Ms. Merriett is experiencing increased confusion since hospital stay. Plan is to return home pending progress.   Will continue to follow.   Marthenia Rolling, MSN, RN,BSN [facility] Coordinator [phone] (Direct dial)

## 2022-06-11 DIAGNOSIS — E1165 Type 2 diabetes mellitus with hyperglycemia: Secondary | ICD-10-CM | POA: Diagnosis not present

## 2022-06-11 DIAGNOSIS — I10 Essential (primary) hypertension: Secondary | ICD-10-CM | POA: Diagnosis not present

## 2022-06-11 DIAGNOSIS — Z299 Encounter for prophylactic measures, unspecified: Secondary | ICD-10-CM | POA: Diagnosis not present

## 2022-06-22 DIAGNOSIS — I509 Heart failure, unspecified: Secondary | ICD-10-CM | POA: Diagnosis not present

## 2022-06-22 DIAGNOSIS — R7989 Other specified abnormal findings of blood chemistry: Secondary | ICD-10-CM | POA: Diagnosis not present

## 2022-06-22 DIAGNOSIS — Z8673 Personal history of transient ischemic attack (TIA), and cerebral infarction without residual deficits: Secondary | ICD-10-CM | POA: Diagnosis not present

## 2022-06-22 DIAGNOSIS — I5021 Acute systolic (congestive) heart failure: Secondary | ICD-10-CM | POA: Diagnosis not present

## 2022-06-22 DIAGNOSIS — Z20822 Contact with and (suspected) exposure to covid-19: Secondary | ICD-10-CM | POA: Diagnosis not present

## 2022-06-22 DIAGNOSIS — Z7982 Long term (current) use of aspirin: Secondary | ICD-10-CM | POA: Diagnosis not present

## 2022-06-22 DIAGNOSIS — G301 Alzheimer's disease with late onset: Secondary | ICD-10-CM | POA: Diagnosis not present

## 2022-06-22 DIAGNOSIS — E872 Acidosis, unspecified: Secondary | ICD-10-CM | POA: Diagnosis not present

## 2022-06-22 DIAGNOSIS — Z79899 Other long term (current) drug therapy: Secondary | ICD-10-CM | POA: Diagnosis not present

## 2022-06-22 DIAGNOSIS — R0600 Dyspnea, unspecified: Secondary | ICD-10-CM | POA: Diagnosis not present

## 2022-06-22 DIAGNOSIS — Z66 Do not resuscitate: Secondary | ICD-10-CM | POA: Diagnosis not present

## 2022-06-22 DIAGNOSIS — E1165 Type 2 diabetes mellitus with hyperglycemia: Secondary | ICD-10-CM | POA: Diagnosis not present

## 2022-06-22 DIAGNOSIS — J9621 Acute and chronic respiratory failure with hypoxia: Secondary | ICD-10-CM | POA: Diagnosis not present

## 2022-06-22 DIAGNOSIS — Z7901 Long term (current) use of anticoagulants: Secondary | ICD-10-CM | POA: Diagnosis not present

## 2022-06-22 DIAGNOSIS — R000 Tachycardia, unspecified: Secondary | ICD-10-CM | POA: Diagnosis not present

## 2022-06-22 DIAGNOSIS — E119 Type 2 diabetes mellitus without complications: Secondary | ICD-10-CM | POA: Diagnosis not present

## 2022-06-22 DIAGNOSIS — R0902 Hypoxemia: Secondary | ICD-10-CM | POA: Diagnosis not present

## 2022-06-22 DIAGNOSIS — R404 Transient alteration of awareness: Secondary | ICD-10-CM | POA: Diagnosis not present

## 2022-06-22 DIAGNOSIS — J90 Pleural effusion, not elsewhere classified: Secondary | ICD-10-CM | POA: Diagnosis not present

## 2022-06-22 DIAGNOSIS — Z9049 Acquired absence of other specified parts of digestive tract: Secondary | ICD-10-CM | POA: Diagnosis not present

## 2022-06-22 DIAGNOSIS — I493 Ventricular premature depolarization: Secondary | ICD-10-CM | POA: Diagnosis not present

## 2022-06-22 DIAGNOSIS — I10 Essential (primary) hypertension: Secondary | ICD-10-CM | POA: Diagnosis not present

## 2022-06-22 DIAGNOSIS — I110 Hypertensive heart disease with heart failure: Secondary | ICD-10-CM | POA: Diagnosis not present

## 2022-06-22 DIAGNOSIS — R54 Age-related physical debility: Secondary | ICD-10-CM | POA: Diagnosis not present

## 2022-06-22 DIAGNOSIS — I469 Cardiac arrest, cause unspecified: Secondary | ICD-10-CM | POA: Diagnosis not present

## 2022-06-22 DIAGNOSIS — I499 Cardiac arrhythmia, unspecified: Secondary | ICD-10-CM | POA: Diagnosis not present

## 2022-06-22 DIAGNOSIS — R0602 Shortness of breath: Secondary | ICD-10-CM | POA: Diagnosis not present

## 2022-06-22 DIAGNOSIS — N179 Acute kidney failure, unspecified: Secondary | ICD-10-CM | POA: Diagnosis not present

## 2022-06-22 DIAGNOSIS — I340 Nonrheumatic mitral (valve) insufficiency: Secondary | ICD-10-CM | POA: Diagnosis not present

## 2022-06-22 DIAGNOSIS — Z791 Long term (current) use of non-steroidal anti-inflammatories (NSAID): Secondary | ICD-10-CM | POA: Diagnosis not present

## 2022-06-22 DIAGNOSIS — Z794 Long term (current) use of insulin: Secondary | ICD-10-CM | POA: Diagnosis not present

## 2022-06-22 DIAGNOSIS — E440 Moderate protein-calorie malnutrition: Secondary | ICD-10-CM | POA: Diagnosis not present

## 2022-06-22 DIAGNOSIS — D649 Anemia, unspecified: Secondary | ICD-10-CM | POA: Diagnosis not present

## 2022-06-22 DIAGNOSIS — K219 Gastro-esophageal reflux disease without esophagitis: Secondary | ICD-10-CM | POA: Diagnosis not present

## 2022-06-22 DIAGNOSIS — I4891 Unspecified atrial fibrillation: Secondary | ICD-10-CM | POA: Diagnosis not present

## 2022-06-22 DIAGNOSIS — I479 Paroxysmal tachycardia, unspecified: Secondary | ICD-10-CM | POA: Diagnosis not present

## 2022-06-22 DIAGNOSIS — M25512 Pain in left shoulder: Secondary | ICD-10-CM | POA: Diagnosis not present

## 2022-06-22 DIAGNOSIS — Z743 Need for continuous supervision: Secondary | ICD-10-CM | POA: Diagnosis not present

## 2022-06-22 DIAGNOSIS — Z9981 Dependence on supplemental oxygen: Secondary | ICD-10-CM | POA: Diagnosis not present

## 2022-06-22 DIAGNOSIS — Z7951 Long term (current) use of inhaled steroids: Secondary | ICD-10-CM | POA: Diagnosis not present

## 2022-06-22 DIAGNOSIS — J9601 Acute respiratory failure with hypoxia: Secondary | ICD-10-CM | POA: Diagnosis not present

## 2022-06-22 DIAGNOSIS — Z9989 Dependence on other enabling machines and devices: Secondary | ICD-10-CM | POA: Diagnosis not present

## 2022-06-23 DIAGNOSIS — R7989 Other specified abnormal findings of blood chemistry: Secondary | ICD-10-CM | POA: Diagnosis not present

## 2022-06-23 DIAGNOSIS — Z9989 Dependence on other enabling machines and devices: Secondary | ICD-10-CM | POA: Diagnosis not present

## 2022-06-23 DIAGNOSIS — J90 Pleural effusion, not elsewhere classified: Secondary | ICD-10-CM | POA: Diagnosis not present

## 2022-06-23 DIAGNOSIS — E119 Type 2 diabetes mellitus without complications: Secondary | ICD-10-CM | POA: Diagnosis not present

## 2022-06-23 DIAGNOSIS — I5021 Acute systolic (congestive) heart failure: Secondary | ICD-10-CM | POA: Diagnosis not present

## 2022-06-23 DIAGNOSIS — Z9981 Dependence on supplemental oxygen: Secondary | ICD-10-CM | POA: Diagnosis not present

## 2022-06-23 DIAGNOSIS — Z79899 Other long term (current) drug therapy: Secondary | ICD-10-CM | POA: Diagnosis not present

## 2022-06-23 DIAGNOSIS — R0902 Hypoxemia: Secondary | ICD-10-CM | POA: Diagnosis not present

## 2022-06-23 DIAGNOSIS — E872 Acidosis, unspecified: Secondary | ICD-10-CM | POA: Diagnosis not present

## 2022-06-23 DIAGNOSIS — R000 Tachycardia, unspecified: Secondary | ICD-10-CM | POA: Diagnosis not present

## 2022-06-23 DIAGNOSIS — G301 Alzheimer's disease with late onset: Secondary | ICD-10-CM | POA: Diagnosis not present

## 2022-06-23 DIAGNOSIS — K219 Gastro-esophageal reflux disease without esophagitis: Secondary | ICD-10-CM | POA: Diagnosis not present

## 2022-06-24 DIAGNOSIS — K219 Gastro-esophageal reflux disease without esophagitis: Secondary | ICD-10-CM | POA: Diagnosis not present

## 2022-06-24 DIAGNOSIS — E119 Type 2 diabetes mellitus without complications: Secondary | ICD-10-CM | POA: Diagnosis not present

## 2022-06-24 DIAGNOSIS — R7989 Other specified abnormal findings of blood chemistry: Secondary | ICD-10-CM | POA: Diagnosis not present

## 2022-06-24 DIAGNOSIS — D649 Anemia, unspecified: Secondary | ICD-10-CM | POA: Diagnosis not present

## 2022-06-24 DIAGNOSIS — R0902 Hypoxemia: Secondary | ICD-10-CM | POA: Diagnosis not present

## 2022-06-24 DIAGNOSIS — I110 Hypertensive heart disease with heart failure: Secondary | ICD-10-CM | POA: Diagnosis not present

## 2022-06-24 DIAGNOSIS — J9601 Acute respiratory failure with hypoxia: Secondary | ICD-10-CM | POA: Diagnosis not present

## 2022-06-24 DIAGNOSIS — R000 Tachycardia, unspecified: Secondary | ICD-10-CM | POA: Diagnosis not present

## 2022-06-24 DIAGNOSIS — Z79899 Other long term (current) drug therapy: Secondary | ICD-10-CM | POA: Diagnosis not present

## 2022-06-24 DIAGNOSIS — Z794 Long term (current) use of insulin: Secondary | ICD-10-CM | POA: Diagnosis not present

## 2022-06-24 DIAGNOSIS — R54 Age-related physical debility: Secondary | ICD-10-CM | POA: Diagnosis not present

## 2022-06-24 DIAGNOSIS — J90 Pleural effusion, not elsewhere classified: Secondary | ICD-10-CM | POA: Diagnosis not present

## 2022-06-24 DIAGNOSIS — Z7901 Long term (current) use of anticoagulants: Secondary | ICD-10-CM | POA: Diagnosis not present

## 2022-06-24 DIAGNOSIS — I5021 Acute systolic (congestive) heart failure: Secondary | ICD-10-CM | POA: Diagnosis not present

## 2022-06-25 ENCOUNTER — Other Ambulatory Visit: Payer: Self-pay | Admitting: Cardiology

## 2022-06-25 DIAGNOSIS — R54 Age-related physical debility: Secondary | ICD-10-CM | POA: Diagnosis not present

## 2022-06-25 DIAGNOSIS — Z79899 Other long term (current) drug therapy: Secondary | ICD-10-CM | POA: Diagnosis not present

## 2022-06-25 DIAGNOSIS — E119 Type 2 diabetes mellitus without complications: Secondary | ICD-10-CM | POA: Diagnosis not present

## 2022-06-25 DIAGNOSIS — Z7901 Long term (current) use of anticoagulants: Secondary | ICD-10-CM | POA: Diagnosis not present

## 2022-06-25 DIAGNOSIS — I110 Hypertensive heart disease with heart failure: Secondary | ICD-10-CM | POA: Diagnosis not present

## 2022-06-25 DIAGNOSIS — Z791 Long term (current) use of non-steroidal anti-inflammatories (NSAID): Secondary | ICD-10-CM | POA: Diagnosis not present

## 2022-06-25 DIAGNOSIS — K219 Gastro-esophageal reflux disease without esophagitis: Secondary | ICD-10-CM | POA: Diagnosis not present

## 2022-06-25 DIAGNOSIS — Z9989 Dependence on other enabling machines and devices: Secondary | ICD-10-CM | POA: Diagnosis not present

## 2022-06-25 DIAGNOSIS — I5021 Acute systolic (congestive) heart failure: Secondary | ICD-10-CM | POA: Diagnosis not present

## 2022-06-25 DIAGNOSIS — D649 Anemia, unspecified: Secondary | ICD-10-CM | POA: Diagnosis not present

## 2022-06-25 DIAGNOSIS — J9601 Acute respiratory failure with hypoxia: Secondary | ICD-10-CM | POA: Diagnosis not present

## 2022-06-25 DIAGNOSIS — R000 Tachycardia, unspecified: Secondary | ICD-10-CM | POA: Diagnosis not present

## 2022-06-26 DIAGNOSIS — I5021 Acute systolic (congestive) heart failure: Secondary | ICD-10-CM | POA: Diagnosis not present

## 2022-06-26 DIAGNOSIS — I509 Heart failure, unspecified: Secondary | ICD-10-CM | POA: Diagnosis not present

## 2022-06-26 DIAGNOSIS — R000 Tachycardia, unspecified: Secondary | ICD-10-CM | POA: Diagnosis not present

## 2022-06-26 DIAGNOSIS — K219 Gastro-esophageal reflux disease without esophagitis: Secondary | ICD-10-CM | POA: Diagnosis not present

## 2022-06-26 DIAGNOSIS — I110 Hypertensive heart disease with heart failure: Secondary | ICD-10-CM | POA: Diagnosis not present

## 2022-06-26 DIAGNOSIS — Z7951 Long term (current) use of inhaled steroids: Secondary | ICD-10-CM | POA: Diagnosis not present

## 2022-06-26 DIAGNOSIS — Z791 Long term (current) use of non-steroidal anti-inflammatories (NSAID): Secondary | ICD-10-CM | POA: Diagnosis not present

## 2022-06-26 DIAGNOSIS — N179 Acute kidney failure, unspecified: Secondary | ICD-10-CM | POA: Diagnosis not present

## 2022-06-26 DIAGNOSIS — D649 Anemia, unspecified: Secondary | ICD-10-CM | POA: Diagnosis not present

## 2022-06-26 DIAGNOSIS — Z79899 Other long term (current) drug therapy: Secondary | ICD-10-CM | POA: Diagnosis not present

## 2022-06-26 DIAGNOSIS — J9601 Acute respiratory failure with hypoxia: Secondary | ICD-10-CM | POA: Diagnosis not present

## 2022-06-26 DIAGNOSIS — E119 Type 2 diabetes mellitus without complications: Secondary | ICD-10-CM | POA: Diagnosis not present

## 2022-06-26 DIAGNOSIS — R54 Age-related physical debility: Secondary | ICD-10-CM | POA: Diagnosis not present

## 2022-06-27 DIAGNOSIS — Z79899 Other long term (current) drug therapy: Secondary | ICD-10-CM | POA: Diagnosis not present

## 2022-06-27 DIAGNOSIS — R000 Tachycardia, unspecified: Secondary | ICD-10-CM | POA: Diagnosis not present

## 2022-06-27 DIAGNOSIS — E119 Type 2 diabetes mellitus without complications: Secondary | ICD-10-CM | POA: Diagnosis not present

## 2022-06-27 DIAGNOSIS — I110 Hypertensive heart disease with heart failure: Secondary | ICD-10-CM | POA: Diagnosis not present

## 2022-06-27 DIAGNOSIS — J9601 Acute respiratory failure with hypoxia: Secondary | ICD-10-CM | POA: Diagnosis not present

## 2022-06-27 DIAGNOSIS — N179 Acute kidney failure, unspecified: Secondary | ICD-10-CM | POA: Diagnosis not present

## 2022-06-27 DIAGNOSIS — Z7901 Long term (current) use of anticoagulants: Secondary | ICD-10-CM | POA: Diagnosis not present

## 2022-06-27 DIAGNOSIS — K219 Gastro-esophageal reflux disease without esophagitis: Secondary | ICD-10-CM | POA: Diagnosis not present

## 2022-06-27 DIAGNOSIS — I5021 Acute systolic (congestive) heart failure: Secondary | ICD-10-CM | POA: Diagnosis not present

## 2022-06-27 DIAGNOSIS — R54 Age-related physical debility: Secondary | ICD-10-CM | POA: Diagnosis not present

## 2022-06-27 DIAGNOSIS — D649 Anemia, unspecified: Secondary | ICD-10-CM | POA: Diagnosis not present

## 2022-06-28 DIAGNOSIS — E440 Moderate protein-calorie malnutrition: Secondary | ICD-10-CM | POA: Diagnosis not present

## 2022-06-28 DIAGNOSIS — R7989 Other specified abnormal findings of blood chemistry: Secondary | ICD-10-CM | POA: Diagnosis not present

## 2022-06-28 DIAGNOSIS — J9621 Acute and chronic respiratory failure with hypoxia: Secondary | ICD-10-CM | POA: Diagnosis not present

## 2022-06-28 DIAGNOSIS — R54 Age-related physical debility: Secondary | ICD-10-CM | POA: Diagnosis not present

## 2022-06-28 DIAGNOSIS — R000 Tachycardia, unspecified: Secondary | ICD-10-CM | POA: Diagnosis not present

## 2022-06-28 DIAGNOSIS — N179 Acute kidney failure, unspecified: Secondary | ICD-10-CM | POA: Diagnosis not present

## 2022-06-28 DIAGNOSIS — K219 Gastro-esophageal reflux disease without esophagitis: Secondary | ICD-10-CM | POA: Diagnosis not present

## 2022-06-28 DIAGNOSIS — E119 Type 2 diabetes mellitus without complications: Secondary | ICD-10-CM | POA: Diagnosis not present

## 2022-06-28 DIAGNOSIS — I469 Cardiac arrest, cause unspecified: Secondary | ICD-10-CM | POA: Diagnosis not present

## 2022-06-28 DIAGNOSIS — I5021 Acute systolic (congestive) heart failure: Secondary | ICD-10-CM | POA: Diagnosis not present

## 2022-06-28 DIAGNOSIS — D649 Anemia, unspecified: Secondary | ICD-10-CM | POA: Diagnosis not present

## 2022-07-22 DEATH — deceased
